# Patient Record
Sex: Male | Born: 1955
Health system: Southern US, Community
[De-identification: ages and names within clinical notes are randomized; demographics above are authoritative.]

## PROBLEM LIST (undated history)

## (undated) DIAGNOSIS — G4730 Sleep apnea, unspecified: Secondary | ICD-10-CM

## (undated) DIAGNOSIS — G4700 Insomnia, unspecified: Secondary | ICD-10-CM

## (undated) DIAGNOSIS — R7303 Prediabetes: Secondary | ICD-10-CM

## (undated) DIAGNOSIS — K219 Gastro-esophageal reflux disease without esophagitis: Secondary | ICD-10-CM

## (undated) DIAGNOSIS — G9340 Encephalopathy, unspecified: Secondary | ICD-10-CM

## (undated) DIAGNOSIS — Z8679 Personal history of other diseases of the circulatory system: Secondary | ICD-10-CM

## (undated) DIAGNOSIS — K429 Umbilical hernia without obstruction or gangrene: Secondary | ICD-10-CM

## (undated) DIAGNOSIS — E782 Mixed hyperlipidemia: Secondary | ICD-10-CM

## (undated) DIAGNOSIS — R011 Cardiac murmur, unspecified: Secondary | ICD-10-CM

## (undated) DIAGNOSIS — L821 Other seborrheic keratosis: Secondary | ICD-10-CM

## (undated) DIAGNOSIS — J45909 Unspecified asthma, uncomplicated: Secondary | ICD-10-CM

## (undated) DIAGNOSIS — Z8619 Personal history of other infectious and parasitic diseases: Secondary | ICD-10-CM

## (undated) DIAGNOSIS — K6289 Other specified diseases of anus and rectum: Secondary | ICD-10-CM

## (undated) DIAGNOSIS — D649 Anemia, unspecified: Secondary | ICD-10-CM

## (undated) DIAGNOSIS — IMO0001 Reserved for inherently not codable concepts without codable children: Secondary | ICD-10-CM

## (undated) DIAGNOSIS — K760 Fatty (change of) liver, not elsewhere classified: Secondary | ICD-10-CM

## (undated) DIAGNOSIS — R7881 Bacteremia: Secondary | ICD-10-CM

## (undated) DIAGNOSIS — J342 Deviated nasal septum: Secondary | ICD-10-CM

## (undated) DIAGNOSIS — Z952 Presence of prosthetic heart valve: Secondary | ICD-10-CM

## (undated) DIAGNOSIS — E669 Obesity, unspecified: Secondary | ICD-10-CM

## (undated) DIAGNOSIS — M4646 Discitis, unspecified, lumbar region: Secondary | ICD-10-CM

## (undated) DIAGNOSIS — I10 Essential (primary) hypertension: Secondary | ICD-10-CM

## (undated) DIAGNOSIS — I8390 Asymptomatic varicose veins of unspecified lower extremity: Secondary | ICD-10-CM

## (undated) DIAGNOSIS — R040 Epistaxis: Secondary | ICD-10-CM

## (undated) HISTORY — PX: TONSILLECTOMY: SUR1361

## (undated) HISTORY — DX: Other specified diseases of anus and rectum: K62.89

## (undated) HISTORY — PX: OTHER SURGICAL HISTORY: SHX169

## (undated) HISTORY — DX: Insomnia, unspecified: G47.00

## (undated) HISTORY — DX: Asymptomatic varicose veins of unspecified lower extremity: I83.90

## (undated) HISTORY — DX: Sleep apnea, unspecified: G47.30

---

## 1981-07-04 HISTORY — PX: AORTIC VALVE REPLACEMENT: SHX41

## 2012-10-17 DIAGNOSIS — G4733 Obstructive sleep apnea (adult) (pediatric): Secondary | ICD-10-CM | POA: Insufficient documentation

## 2014-03-02 DIAGNOSIS — F5101 Primary insomnia: Secondary | ICD-10-CM | POA: Insufficient documentation

## 2014-03-02 DIAGNOSIS — Z952 Presence of prosthetic heart valve: Secondary | ICD-10-CM | POA: Insufficient documentation

## 2014-03-17 ENCOUNTER — Other Ambulatory Visit: Payer: Self-pay | Admitting: *Deleted

## 2014-03-17 DIAGNOSIS — I83893 Varicose veins of bilateral lower extremities with other complications: Secondary | ICD-10-CM

## 2014-04-15 ENCOUNTER — Institutional Professional Consult (permissible substitution): Payer: Self-pay | Admitting: Pulmonary Disease

## 2014-04-18 ENCOUNTER — Encounter: Payer: Self-pay | Admitting: *Deleted

## 2014-04-21 ENCOUNTER — Encounter: Payer: Self-pay | Admitting: Vascular Surgery

## 2014-04-22 ENCOUNTER — Ambulatory Visit (HOSPITAL_COMMUNITY)
Admission: RE | Admit: 2014-04-22 | Discharge: 2014-04-22 | Disposition: A | Discharge status: Home / Self Care | Payer: 59 | Source: Ambulatory Visit | Attending: Vascular Surgery | Admitting: Vascular Surgery

## 2014-04-22 ENCOUNTER — Encounter: Payer: Self-pay | Admitting: Vascular Surgery

## 2014-04-22 ENCOUNTER — Ambulatory Visit: Payer: Self-pay | Admitting: Interventional Cardiology

## 2014-04-22 ENCOUNTER — Ambulatory Visit (INDEPENDENT_AMBULATORY_CARE_PROVIDER_SITE_OTHER): Payer: 59 | Admitting: Vascular Surgery

## 2014-04-22 VITALS — BP 150/91 | HR 65 | Resp 16 | Ht 72.0 in | Wt 250.0 lb

## 2014-04-22 DIAGNOSIS — I83892 Varicose veins of left lower extremities with other complications: Secondary | ICD-10-CM

## 2014-04-22 DIAGNOSIS — I83812 Varicose veins of left lower extremities with pain: Secondary | ICD-10-CM

## 2014-04-22 DIAGNOSIS — I8393 Asymptomatic varicose veins of bilateral lower extremities: Secondary | ICD-10-CM | POA: Diagnosis not present

## 2014-04-22 DIAGNOSIS — I83899 Varicose veins of unspecified lower extremities with other complications: Secondary | ICD-10-CM | POA: Insufficient documentation

## 2014-04-22 NOTE — Progress Notes (Signed)
Subjective:     Patient ID: Jeffery Whitehead, male   DOB: [birth year], 58 y.o.   MRN: [id number]  HPI this 58 year-old male is evaluated for recurrent varicose veins in the left leg. Patient had laser ablation treatment in [place] and 20/10 in 2011 in both his right and left lower extremities. He fairly rapidly developed recurrent varicosities in the left leg in the medial calf medial distal thigh and a large cluster in the proximal thigh. He has had increasing edema in the left ankle area. He said no history of DVT, thrombophlebitis, stasis ulcers, or bleeding. He does not wear last compression stockings. He also developed a small recurrent varicosity in the medial thigh on the right side. He is having some aching and throbbing discomfort as the day progresses.   Past Medical History  Diagnosis Date  . Varicose vein of leg   . Sleep apnea   . Decreased anal sphincter tone   . Insomnia     History  Substance Use Topics  . Smoking status: Never Smoker   . Smokeless tobacco: Not on file  . Alcohol Use: Not on file    History reviewed. No pertinent family history.  No Known Allergies  Current outpatient prescriptions:Warfarin Sodium (COUMADIN PO), Take by mouth as directed. 9 mg on S/S/T/TH... 7 mg all other days., Disp: , Rfl: ;  Zolpidem Tartrate (AMBIEN PO), Take by mouth as directed. No dosage given, Disp: , Rfl:   BP 150/91  Pulse 65  Resp 16  Ht 6' (1.829 m)  Wt 250 lb (113.399 kg)  BMI 33.90 kg/m2  Body mass index is 33.9 kg/(m^2).           Review of Systems denies chest pain, dyspnea on exertion, PND, orthopnea, hemoptysis. Patient had St. Jude valve placed in [place] in [place] by Dr. Leona Carry. He has been on Coumadin therapy since that time. Has had no complications from the valve replacement. All other systems negative the complete review of systems    Objective:   Physical Exam BP 150/91  Pulse 65  Resp 16  Ht 6' (1.829 m)  Wt 250 lb (113.399 kg)  BMI  33.90 kg/m2  Gen.-alert and oriented x3 in no apparent distress HEENT normal for age Lungs no rhonchi or wheezing Cardiovascular regular rhythm -Crisp  valve sounds from prosthetic valve replacement-- carotid pulses 3+ palpable no bruits audible Abdomen soft nontender no palpable masses Musculoskeletal free of  major deformities Skin clear -no rashes Neurologic normal Lower extremities 3+ femoral and dorsalis pedis pulses palpable bilaterally with 1+ edema bilaterally Left leg with large nests of extensive bulging varicosities getting at the saphenofemoral junction and extending medially in the thigh distal thigh and medial calf. Cluster of reticular veins and left ankle area with 1+ edema but no active ulceration. Right leg has isolated varix in medial mid thigh area with 1+ edema.  Today I ordered a venous duplex exam of the left leg which are reviewed and interpreted. There is no DVT. There is gross reflux and a short segment of greater saphenous vein beginning at the saphenofemoral junction and extending about 4-5 cm distally. There is no reflux and the small saphenous vein.        Assessment:     Recurrent large painful varicosities due to  reflux left great saphenous vein    Plan:     Patient needs ligation of greater saphenous vein at saphenofemoral junction with excision multiple varicosities as an open operative  procedure Patient has appointment to establish cardiology followup with Dr. Casandra Doffing in the next few weeks Will need to get an opinion regarding discontinuing Coumadin therapy or bridging him with Lovenox around the surgical procedure Apparently in the past when patient has had to come off of his Coumadin he has not been bridged with any anticoagulation      #1 long leg elastic compression stockings 20-30 mm gradient #2 elevate legs as much as possible #3 ibuprofen daily on a regular basis for pain #4 return in 3 months-if no significant improvement then  patient will need ligation left saphenofemoral junction and excision multiple varicosities under general anesthesia

## 2014-05-05 ENCOUNTER — Telehealth: Payer: Self-pay | Admitting: Pulmonary Disease

## 2014-05-05 NOTE — Telephone Encounter (Signed)
Results were received from [facility]. No sleep study attached. Pt will have to sign a medical release at [facility] to get records from [facility]  Called pt and LMTCB

## 2014-05-05 NOTE — Telephone Encounter (Signed)
Message copied from [facility]:  I am wondering if you have my medical history for my Pulmonology as I am scheduled for an appointment on November 18.  My previous Pulmonologist (Dr. Cassell Smiles) has moved to a new practice, but the practice he saw me at was [facility].  The address is [address], [address], [place], [place].  The phone number is [phone].  You may have received this data from [facility] in [place], [place], but in case you have not, I did want to give you the data on the last Pulmonologist I saw.    Jeffery Whitehead

## 2014-05-06 NOTE — Telephone Encounter (Signed)
Spoke with pt and advised that records were received but will still need sleep study if he had one.

## 2014-05-21 ENCOUNTER — Ambulatory Visit (INDEPENDENT_AMBULATORY_CARE_PROVIDER_SITE_OTHER): Payer: 59 | Admitting: Pulmonary Disease

## 2014-05-21 ENCOUNTER — Encounter: Payer: Self-pay | Admitting: Pulmonary Disease

## 2014-05-21 VITALS — BP 138/82 | HR 70 | Temp 98.1°F | Ht 72.0 in | Wt 253.4 lb

## 2014-05-21 DIAGNOSIS — G4733 Obstructive sleep apnea (adult) (pediatric): Secondary | ICD-10-CM

## 2014-05-21 NOTE — Progress Notes (Signed)
Subjective:    Patient ID: Jeffery Whitehead, male    DOB: 11/15/1955, 58 y.o.   MRN: [id number]  HPI The patient is a 58 year old male who I have been asked to see for management of obstructive sleep apnea. The patient was diagnosed with moderate obstructive sleep apnea and 2013 while living in [place], and he had an AHI of 21 events per hour. He was started on C Pap with a titrated pressure to 9 cm, and has been wearing the device compliantly since that time. He currently has dated supplies, including his hoses, humidity chamber, and mask with headgear. The patient has a long-standing history of chronic sleep onset insomnia, for which she usually takes Ambien. The patient states that he sleeps well once he gets to sleep, and feels rested when he first arises in the morning. However, he does not feel energetic during the day, and makes the point that he does worse when wearing the device then not. However, he does not describe daytime sleepiness, and his Epworth score today is only 4. We have gotten a download today that shows excellent control of his AHI, good compliance, but he does have excessive mask leak. The patient states that his weight is up 35 pounds over the last 2 years.   Sleep Questionnaire What time do you typically go to bed?( Between what hours) 10:30-11:30 PM 10:30-11:30 PM at 1603 on 05/21/14 by Inge Rise, CMA How long does it take you to fall asleep? 10 min w/ taking ambien.  10 min w/ taking ambien.  at 1603 on 05/21/14 by Inge Rise, CMA How many times during the night do you wake up? 0 0 at 1603 on 05/21/14 by Inge Rise, CMA What time do you get out of bed to start your day? 0430 0430 at 1603 on 05/21/14 by Inge Rise, CMA Do you drive or operate heavy machinery in your occupation? No No at 1603 on 05/21/14 by Inge Rise, CMA How much has your weight changed (up or down) over the past two years? (In pounds) 35 lb (15.876 kg) 35 lb (15.876 kg) at 1603 on  05/21/14 by Inge Rise, CMA Have you ever had a sleep study before? Yes Yes at 1603 on 05/21/14 by Inge Rise, CMA If yes, location of study? [facility] on 05/21/14 by Inge Rise, CMA If yes, date of study? 2014 2014 at 1603 on 05/21/14 by Inge Rise, CMA Do you currently use CPAP? Yes Yes at 1603 on 05/21/14 by Inge Rise, CMA If so, what pressure? not sure not sure at 1603 on 05/21/14 by Inge Rise, CMA Do you wear oxygen at any time? No No at 1603 on 05/21/14 by Inge Rise, CMA   Review of Systems  Constitutional: Negative for fever and unexpected weight change.  HENT: Negative for congestion, dental problem, ear pain, nosebleeds, postnasal drip, rhinorrhea, sinus pressure, sneezing, sore throat and trouble swallowing.   Eyes: Negative for redness and itching.  Respiratory: Negative for cough, chest tightness, shortness of breath and wheezing.   Cardiovascular: Negative for palpitations and leg swelling.  Gastrointestinal: Negative for nausea and vomiting.  Genitourinary: Negative for dysuria.  Musculoskeletal: Negative for joint swelling.  Skin: Negative for rash.  Neurological: Negative for headaches.  Hematological: Does not bruise/bleed easily.  Psychiatric/Behavioral: Negative for dysphoric mood. The patient is not nervous/anxious.        Objective:  Physical Exam Constitutional:  Overweight male, no acute distress  HENT:  Nares patent without discharge  Oropharynx without exudate, palate and uvula are mildly elongated.   Eyes:  Perrla, eomi, no scleral icterus  Neck:  No JVD, no TMG  Cardiovascular:  Normal rate, regular rhythm, no rubs or gallops.  No murmurs        Intact distal pulses  Pulmonary :  Normal breath sounds, no stridor or respiratory distress   No rales, rhonchi, or wheezing  Abdominal:  Soft, nondistended, bowel sounds present.  No tenderness noted.   Musculoskeletal:  No  lower extremity edema noted.  Lymph Nodes:  No cervical lymphadenopathy noted  Skin:  No cyanosis noted  Neurologic:  Alert, appropriate, moves all 4 extremities without obvious deficit.          Assessment & Plan:

## 2014-05-21 NOTE — Patient Instructions (Signed)
Will have [employer] get you a new mask and humidifier chamber. Will try you on the auto setting if this is available on your device. Work on [employer] loss Will send a note to your primary physician recommending referral to a behavioral psychologist for CBT to treat your insomnia followup with me again on a yearly basis, but let me know if your are having issues with your cpap.

## 2014-05-21 NOTE — Assessment & Plan Note (Signed)
The patient has a history of moderate obstructive sleep apnea, and has been on C Pap since 2013. His download shows excellent compliance, good control of his AHI, but he does have excessive mask leak. He is overdue for a new humidity chamber, mask, as well as tubing. Despite wearing his device and having good control of his sleep apnea, he feels more tired during the day when he wears his device than not. I suspect this is related to other issues, and he does have a chronic issue with insomnia. I have explained to him the treatment of insomnia is primarily behavioral therapy with a psychologist. I wonder if he would do better on an automatic setting rather than a fixed pressure, and we'll send an order to his home care company to see if this is available on his device. Finally, I have encouraged him to work aggressively on weight loss since he has gained significant weight in the last 2 years.

## 2014-06-19 ENCOUNTER — Encounter: Payer: Self-pay | Admitting: Interventional Cardiology

## 2014-06-19 ENCOUNTER — Ambulatory Visit (INDEPENDENT_AMBULATORY_CARE_PROVIDER_SITE_OTHER): Payer: 59 | Admitting: Interventional Cardiology

## 2014-06-19 VITALS — BP 138/84 | HR 61 | Ht 72.0 in | Wt 249.2 lb

## 2014-06-19 DIAGNOSIS — Z954 Presence of other heart-valve replacement: Secondary | ICD-10-CM

## 2014-06-19 DIAGNOSIS — E663 Overweight: Secondary | ICD-10-CM | POA: Insufficient documentation

## 2014-06-19 DIAGNOSIS — R0609 Other forms of dyspnea: Secondary | ICD-10-CM

## 2014-06-19 DIAGNOSIS — Z952 Presence of prosthetic heart valve: Secondary | ICD-10-CM

## 2014-06-19 NOTE — Progress Notes (Signed)
Patient ID: Jeffery Whitehead, male   DOB: 1956-07-04, 58 y.o.   MRN: [id number]     Patient ID: Jeffery Whitehead MRN: [id number] DOB/AGE: 05-27-1956 58 y.o.   Referring Physician Dr. Modena Morrow   Reason for Consultation  H/o AVR in 1983  HPI: 58 y/o who had an AVR in 65 at age 42, with a St Jude Valve; model number 25A-101; serial number [id number]; blood type A+.  He has been on Coumadin since that time.  He moved here from [place] several months ago for his job at [employer]. He had a bicuspid aortic valve that was replaced.  Only bleeding issue has been occasional bloody nose.  He has had several cauterizations.    Over the past few months, he noticed some DOE.  He saw a lung   He has OSA and uses CPAP.  He has started to have some knee pain as well.  THis limits his walking.  He has gained weight in the psat few months as well.  He walks his dogs as well.  No chest pain. No nausea, vomiting or sweating when not expected.  He sweats more with less exertion in the past few months.    No early CAD in the family.  Stress test on the treadmill was done 2 years ago.  He had his last echo a year and a half ago.  He was having annual echos.  He requested records form [place].      Current Outpatient Prescriptions  Medication Sig Dispense Refill  . fluticasone (FLONASE) 50 MCG/ACT nasal spray Place 1 spray into both nostrils as needed for allergies or rhinitis (as needed for allergy symptoms).    . loratadine (CLARITIN) 10 MG tablet Take 10 mg by mouth daily.    . Multiple Vitamins-Minerals (CENTRUM SILVER ADULT 50+ PO) Take 1 tablet by mouth daily with breakfast.    . SF 5000 PLUS 1.1 % CREA dental cream Place 1 application onto teeth at bedtime.     . Warfarin Sodium (COUMADIN PO) Take by mouth as directed. Take as directed    . zolpidem (AMBIEN) 10 MG tablet One tablet at bedtime     No current facility-administered medications for this visit.   Past Medical History  Diagnosis Date  .  Varicose vein of leg   . Sleep apnea   . Decreased anal sphincter tone   . Insomnia     Family History  Problem Relation Age of Onset  . Heart disease Father     History   Social History  . Marital Status: Unknown    Spouse Name: N/A    Number of Children: 1  . Years of Education: N/A   Occupational History  . Database administrator    Social History Main Topics  . Smoking status: Never Smoker   . Smokeless tobacco: Not on file  . Alcohol Use: 0.0 oz/week    0 Not specified per week     Comment: wine at night  . Drug Use: No  . Sexual Activity: Not on file   Other Topics Concern  . Not on file   Social History Narrative    Past Surgical History  Procedure Laterality Date  . Aortic valve replacement  1983      (Not in a hospital admission)  Review of systems complete and found to be negative unless listed above .  No nausea, vomiting.  No fever chills, No focal weakness,  No palpitations.  Physical Exam: Danley Danker  Vitals:   06/19/14 0758  BP: 138/84  Pulse: 61    Weight: 249 lb 3.2 oz (113.036 kg)  Physical exam:  [place]/AT EOMI No JVD, No carotid bruit RRR S1 crisp S2 click No wheezing Soft. NT, nondistended No edema.  Large varicose veins at left groin and thigh No focal motor or sensory deficits Normal affect No rash  Labs:  No results found for: WBC, HGB, HCT, MCV, PLT No results for input(s): NA, K, CL, CO2, BUN, CREATININE, CALCIUM, PROT, BILITOT, ALKPHOS, ALT, AST, GLUCOSE in the last 168 hours.  Invalid input(s): LABALBU No results found for: CKTOTAL, CKMB, CKMBINDEX, TROPONINI No results found for: CHOL No results found for: HDL No results found for: LDLCALC No results found for: TRIG No results found for: CHOLHDL No results found for: LDLDIRECT     EKG: NSR, TWI inferiorly and laterally  ASSESSMENT AND PLAN:  1) Aortic valve replacement:  Plan for echocardiogram. He had been having annual echocardiograms while in [place].  My exam, I  think his valve is functioning well. However he now has this new onset shortness of breath. It may be related to weight gain and deconditioning.  2) Coumadin checks: He is interested in home INR monitoring.  3)DOE: Check echo.  If valve function is normal by echocardiogram, would plan for stress test.  He would likely need stress echo or nuclear stress testing given his abnormal ECG. Given his diagnosis of sleep apnea, continue treatment with CPAP.  4) obesity: He has gained weight during the time of his move and since that time. We talked about trying to improve his diet. We also talked about eventually increasing exercise to at least 150 minutes per week after cardiac workup. We'll try to find some activity that it does not cause too much discomfort in his knee.  Signed:   Mina Marble, MD, [facility] 06/19/2014, 8:11 AM

## 2014-06-19 NOTE — Patient Instructions (Signed)
Your physician recommends that you continue on your current medications as directed. Please refer to the Current Medication list given to you today.   Your physician has requested that you have an echocardiogram. Echocardiography is a painless test that uses sound waves to create images of your heart. It provides your doctor with information about the size and shape of your heart and how well your heart's chambers and valves are working. This procedure takes approximately one hour. There are no restrictions for this procedure.  Your physician wants you to follow-up in: 6 months with Dr. Irish Lack. You will receive a reminder letter in the mail two months in advance. If you don't receive a letter, please call our office to schedule the follow-up appointment.

## 2014-06-20 ENCOUNTER — Other Ambulatory Visit (HOSPITAL_COMMUNITY): Payer: 59

## 2014-06-30 ENCOUNTER — Ambulatory Visit (HOSPITAL_COMMUNITY): Payer: 59 | Attending: Cardiology | Admitting: Cardiology

## 2014-06-30 DIAGNOSIS — E669 Obesity, unspecified: Secondary | ICD-10-CM | POA: Insufficient documentation

## 2014-06-30 DIAGNOSIS — Z954 Presence of other heart-valve replacement: Secondary | ICD-10-CM | POA: Diagnosis not present

## 2014-06-30 DIAGNOSIS — Z952 Presence of prosthetic heart valve: Secondary | ICD-10-CM

## 2014-06-30 NOTE — Progress Notes (Signed)
Echo performed. 

## 2014-07-28 ENCOUNTER — Encounter: Payer: Self-pay | Admitting: Vascular Surgery

## 2014-07-29 ENCOUNTER — Encounter: Payer: Self-pay | Admitting: Vascular Surgery

## 2014-07-29 ENCOUNTER — Ambulatory Visit (INDEPENDENT_AMBULATORY_CARE_PROVIDER_SITE_OTHER): Payer: 59 | Admitting: Vascular Surgery

## 2014-07-29 VITALS — BP 140/92 | HR 71 | Resp 16 | Ht 72.0 in | Wt 250.0 lb

## 2014-07-29 DIAGNOSIS — I83892 Varicose veins of left lower extremities with other complications: Secondary | ICD-10-CM

## 2014-07-29 NOTE — Progress Notes (Signed)
Subjective:     Patient ID: Jeffery Whitehead, male   DOB: 1955/07/30, 59 y.o.   MRN: [id number]  HPI this 59 year old male returns for further follow-up regarding his recurrent painful varicosities in the left leg. He had laser ablation in 2010 and again in 2011 in [place] and was seen 3 months ago with large painful recurrent varicosities in the medial thigh from proximal to distal and in the medial calf of the left leg. He also has chronic edema and pain. He has tried long-leg elastic compression stockings 20-30 millimeter gradient as well as elevation and ibuprofen with no improvement in his symptoms. These are affecting his daily living  Past Medical History  Diagnosis Date  . Varicose vein of leg   . Sleep apnea   . Decreased anal sphincter tone   . Insomnia   . Varicose veins     History  Substance Use Topics  . Smoking status: Never Smoker   . Smokeless tobacco: Never Used  . Alcohol Use: 0.0 oz/week    0 Not specified per week     Comment: wine at night    Family History  Problem Relation Age of Onset  . Heart disease Father     No Known Allergies   Current outpatient prescriptions:  .  fluticasone (FLONASE) 50 MCG/ACT nasal spray, Place 1 spray into both nostrils as needed for allergies or rhinitis (as needed for allergy symptoms)., Disp: , Rfl:  .  loratadine (CLARITIN) 10 MG tablet, Take 10 mg by mouth daily., Disp: , Rfl:  .  Multiple Vitamins-Minerals (CENTRUM SILVER ADULT 50+ PO), Take 1 tablet by mouth daily with breakfast., Disp: , Rfl:  .  SF 5000 PLUS 1.1 % CREA dental cream, Place 1 application onto teeth at bedtime. , Disp: , Rfl:  .  Warfarin Sodium (COUMADIN PO), Take by mouth as directed. Take as directed, Disp: , Rfl:  .  zolpidem (AMBIEN) 10 MG tablet, One tablet at bedtime, Disp: , Rfl:   BP 140/92 mmHg  Pulse 71  Resp 16  Ht 6' (1.829 m)  Wt 250 lb (113.399 kg)  BMI 33.90 kg/m2  Body mass index is 33.9 kg/(m^2).           Review of  Systems denies chest pain, orthopnea, hemoptysis, claudication. Has history of St. Jude heart valve replacement-on chronic Coumadin since 1983     Objective:   Physical Exam BP 140/92 mmHg  Pulse 71  Resp 16  Ht 6' (1.829 m)  Wt 250 lb (113.399 kg)  BMI 33.90 kg/m2  Gen.-alert and oriented x3 in no apparent distress HEENT normal for age Lungs no rhonchi or wheezing Cardiovascular regular rhythm , audible click from prosthetic valve, carotid pulses 3+ palpable no bruits audible Abdomen soft nontender no palpable masses Musculoskeletal free of  major deformities Skin clear -no rashes Neurologic normal Lower extremities 3+ femoral and dorsalis pedis pulses palpable bilaterally with no edema on right 1+ edema on left Large nest of bulging varicosities beginning at the inguinal crease on the left side extending medially and down the medial thigh into the medial calf with 1+ edema and nest of reticular veins distally.  Duplex scan at last visit revealed very short stump of left great saphenous vein supplying these bulging varicosities with gross reflux throughout     Assessment:     Recurrent varicosities left thigh and calf which are painful and affecting patient's daily living due to gross reflux proximal stump of great saphenous vein  status post previous ablation procedures 2 in [place] Prosthetic heart valve-on Coumadin therapy    Plan:     Patient needs ligation of great saphenous vein at saphenofemoral junction with removal of painful varicosities in left thigh and calf-greater than 20 stab phlebectomy Will proceed with precertification and organize the discontinuing of Coumadin preoperatively 5 days prior to surgery and Lovenox bridge if Dr. Irish Lack feels this is indicated.

## 2014-07-31 ENCOUNTER — Other Ambulatory Visit: Payer: Self-pay

## 2014-08-06 NOTE — Pre-Procedure Instructions (Signed)
Cadence Haslam  08/06/2014   Your procedure is scheduled on:  08/15/14  Report to [facility] Admitting at 530 AM.  Call this number if you have problems the morning of surgery: [phone]   Remember:   Do not eat food or drink liquids after midnight.   Take these medicines the morning of surgery with A SIP OF WATER: all inhalers   Do not wear jewelry, make-up or nail polish.  Do not wear lotions, powders, or perfumes. You may wear deodorant.  Do not shave 48 hours prior to surgery. Men may shave face and neck.  Do not bring valuables to the hospital.  [facility] is not responsible                  for any belongings or valuables.               Contacts, dentures or bridgework may not be worn into surgery.  Leave suitcase in the car. After surgery it may be brought to your room.  For patients admitted to the hospital, discharge time is determined by your                treatment team.               Patients discharged the day of surgery will not be allowed to drive  home.  Name and phone number of your driver: family  Special Instructions: Shower using CHG 2 nights before surgery and the night before surgery.  If you shower the day of surgery use CHG.  Use special wash - you have one bottle of CHG for all showers.  You should use approximately 1/3 of the bottle for each shower.   Please read over the following fact sheets that you were given: Pain Booklet, Coughing and Deep Breathing and Surgical Site Infection Prevention

## 2014-08-07 ENCOUNTER — Encounter (HOSPITAL_COMMUNITY)
Admission: RE | Admit: 2014-08-07 | Discharge: 2014-08-07 | Disposition: A | Discharge status: Home / Self Care | Payer: 59 | Source: Ambulatory Visit | Attending: Vascular Surgery | Admitting: Vascular Surgery

## 2014-08-07 ENCOUNTER — Encounter (HOSPITAL_COMMUNITY): Payer: Self-pay

## 2014-08-07 ENCOUNTER — Telehealth: Payer: Self-pay | Admitting: Interventional Cardiology

## 2014-08-07 DIAGNOSIS — G4733 Obstructive sleep apnea (adult) (pediatric): Secondary | ICD-10-CM | POA: Insufficient documentation

## 2014-08-07 DIAGNOSIS — I8392 Asymptomatic varicose veins of left lower extremity: Secondary | ICD-10-CM | POA: Insufficient documentation

## 2014-08-07 DIAGNOSIS — Z7901 Long term (current) use of anticoagulants: Secondary | ICD-10-CM | POA: Diagnosis not present

## 2014-08-07 DIAGNOSIS — Z01812 Encounter for preprocedural laboratory examination: Secondary | ICD-10-CM | POA: Diagnosis present

## 2014-08-07 HISTORY — DX: Presence of prosthetic heart valve: Z95.2

## 2014-08-07 HISTORY — DX: Reserved for inherently not codable concepts without codable children: IMO0001

## 2014-08-07 LAB — APTT: aPTT: 38 seconds — ABNORMAL HIGH (ref 24–37)

## 2014-08-07 LAB — PROTIME-INR
INR: 1.84 — ABNORMAL HIGH (ref 0.00–1.49)
Prothrombin Time: 21.4 seconds — ABNORMAL HIGH (ref 11.6–15.2)

## 2014-08-07 LAB — POCT I-STAT 4, (NA,K, GLUC, HGB,HCT)
GLUCOSE: 102 mg/dL — AB (ref 70–99)
HCT: 41 % (ref 39.0–52.0)
HEMOGLOBIN: 13.9 g/dL (ref 13.0–17.0)
Potassium: 4.4 mmol/L (ref 3.5–5.1)
SODIUM: 140 mmol/L (ref 135–145)

## 2014-08-07 MED ORDER — CHLORHEXIDINE GLUCONATE CLOTH 2 % EX PADS: 6.0000 | MEDICATED_PAD | Freq: Once | CUTANEOUS | Status: DC

## 2014-08-07 NOTE — Progress Notes (Signed)
Pt is to contact Dr Irish Lack req. The lovenox bridge.

## 2014-08-07 NOTE — Progress Notes (Addendum)
Anesthesia Chart Review:  Pt is 59 year old male scheduled for L saphenous vein ligation and excision of varicosities on 08/15/2014 with Dr. Kellie Simmering.  Cardiologist is Dr. Irish Lack.   PMH includes: AVR in 1983, OSA. BMI 34  Medications include: coumadin. Per Dr. Kellie Simmering, pt is to stop coumadin 5 days prior to surgery. Pt has call in to Dr. Irish Lack to see if needs lovenox bridge.   Preoperative labs reviewed.  PTT 38. PT/INR 21.4/1.84. Will repeat DOS.   EKG 06/19/2014: NSR. ST & T wave abnormality, consider inferior ischemia. ST & T wave abnormality, consider anterolateral ischemia.   Echo 06/30/2014: - Left ventricle: The cavity size was moderately dilated. There was moderate focal basal and mild concentric hypertrophy. Systolic function was normal. The estimated ejection fraction was in the range of 55% to 60%. Wall motion was normal; there were no regional wall motion abnormalities. - Aortic valve: There is a mechanical prosthesis in the aortic position that is not well visualized. The mean AV gradient is 21mmHg. A mechanical prosthesis was present. - Aorta: Aortic root dimension: 41 mm (ED). - Ascending aorta: The ascending aorta was mildly dilated. - Mitral valve: Calcified annulus. There was mild regurgitation. - Right ventricle: The cavity size was mildly dilated. Wall thickness was normal.  Willeen Cass, FNP-BC [facility]/Anesthesiology Phone: [phone] 08/07/2014 4:39 PM  Addendum:  Colletta [place] from [facility] reached out to Dr. Irish Lack, questioning if pt requires stress test prior to surgery. Per telephone note in Epic from Dr. Irish Lack dated 08/07/2014, pt does not need stress test.   If no changes, I anticipate pt can proceed with surgery as scheduled.   Willeen Cass, FNP-BC [facility] Short Stay Surgical Center/Anesthesiology Phone: [phone] 08/08/2014 12:51 PM

## 2014-08-07 NOTE — Telephone Encounter (Signed)
F/U        Colletta [place] from [facility] calling about Levonox bridge and ordering a stress test for pt.   Please return call at [phone].

## 2014-08-07 NOTE — Telephone Encounter (Signed)
Request for surgical clearance:  1. What type of surgery is being performed? Varicose Vein   2. When is this surgery scheduled? 2.12.16   3. Are there any medications that need to be held prior to surgery and how long?Coumadin  "Need to know how how he need to come off"  4. Name of physician performing surgery? Dr Kellie Simmering   5. What is your office phone and fax number? Phone [phone] and fax # pt don't have.  6.

## 2014-08-07 NOTE — Telephone Encounter (Signed)
Forwarded to Gay Filler to review for lovenox and to Dr. Vladimir Faster to address stress test.

## 2014-08-07 NOTE — Telephone Encounter (Signed)
He does not need a stress test.  Awaiting recs from [facility] regarding bridging of anticoagulation.

## 2014-08-07 NOTE — Telephone Encounter (Signed)
Please assess need for bridging.

## 2014-08-08 NOTE — Telephone Encounter (Signed)
Follow up   Pt called to follow up on bridging of coumadin. Please call

## 2014-08-08 NOTE — Telephone Encounter (Signed)
Pt is on Coumadin for AVR.  Has no history of stroke or atrial fibrillation.  Per protocol, okay to hold Coumadin x 5 days with no Lovenox bridge.  Pt aware of recommendation.

## 2014-08-14 MED ORDER — SODIUM CHLORIDE 0.9 % IV SOLN: INTRAVENOUS | Status: DC

## 2014-08-14 MED ORDER — DEXTROSE 5 % IV SOLN
1.5000 g | INTRAVENOUS | Status: AC
  Administered 2014-08-15: 1.5 g via INTRAVENOUS
  Filled 2014-08-14: qty 1.5

## 2014-08-15 ENCOUNTER — Ambulatory Visit (HOSPITAL_COMMUNITY)
Admission: RE | Admit: 2014-08-15 | Discharge: 2014-08-15 | Disposition: A | Discharge status: Home / Self Care | Payer: 59 | Source: Ambulatory Visit | Attending: Vascular Surgery | Admitting: Vascular Surgery

## 2014-08-15 ENCOUNTER — Ambulatory Visit (HOSPITAL_COMMUNITY): Payer: 59 | Admitting: Certified Registered Nurse Anesthetist

## 2014-08-15 ENCOUNTER — Ambulatory Visit (HOSPITAL_COMMUNITY): Payer: 59 | Admitting: Emergency Medicine

## 2014-08-15 ENCOUNTER — Encounter (HOSPITAL_COMMUNITY): Payer: Self-pay | Admitting: Certified Registered Nurse Anesthetist

## 2014-08-15 ENCOUNTER — Encounter (HOSPITAL_COMMUNITY)
Admission: RE | Disposition: A | Discharge status: Home / Self Care | Payer: Self-pay | Source: Ambulatory Visit | Attending: Vascular Surgery

## 2014-08-15 ENCOUNTER — Telehealth: Payer: Self-pay | Admitting: Vascular Surgery

## 2014-08-15 DIAGNOSIS — I83812 Varicose veins of left lower extremities with pain: Secondary | ICD-10-CM | POA: Diagnosis present

## 2014-08-15 DIAGNOSIS — G4700 Insomnia, unspecified: Secondary | ICD-10-CM | POA: Diagnosis not present

## 2014-08-15 DIAGNOSIS — I8310 Varicose veins of unspecified lower extremity with inflammation: Secondary | ICD-10-CM

## 2014-08-15 DIAGNOSIS — Z79899 Other long term (current) drug therapy: Secondary | ICD-10-CM | POA: Insufficient documentation

## 2014-08-15 DIAGNOSIS — Z6834 Body mass index (BMI) 34.0-34.9, adult: Secondary | ICD-10-CM | POA: Insufficient documentation

## 2014-08-15 DIAGNOSIS — I739 Peripheral vascular disease, unspecified: Secondary | ICD-10-CM | POA: Insufficient documentation

## 2014-08-15 DIAGNOSIS — G4730 Sleep apnea, unspecified: Secondary | ICD-10-CM | POA: Diagnosis not present

## 2014-08-15 DIAGNOSIS — Z9989 Dependence on other enabling machines and devices: Secondary | ICD-10-CM | POA: Insufficient documentation

## 2014-08-15 HISTORY — DX: Reserved for inherently not codable concepts without codable children: IMO0001

## 2014-08-15 HISTORY — PX: VEIN LIGATION AND STRIPPING: SHX2653

## 2014-08-15 LAB — PROTIME-INR
INR: 1.12 (ref 0.00–1.49)
Prothrombin Time: 14.5 seconds (ref 11.6–15.2)

## 2014-08-15 LAB — APTT: APTT: 30 s (ref 24–37)

## 2014-08-15 SURGERY — LIGATION AND STRIPPING, VARICOSE VEIN
Anesthesia: General | Site: Leg Lower | Laterality: Left

## 2014-08-15 MED ORDER — SUCCINYLCHOLINE CHLORIDE 20 MG/ML IJ SOLN
INTRAMUSCULAR | Status: AC
  Filled 2014-08-15: qty 1

## 2014-08-15 MED ORDER — LACTATED RINGERS IV SOLN
INTRAVENOUS | Status: DC | PRN
  Administered 2014-08-15: 07:00:00 via INTRAVENOUS

## 2014-08-15 MED ORDER — FENTANYL CITRATE 0.05 MG/ML IJ SOLN
INTRAMUSCULAR | Status: DC | PRN
  Administered 2014-08-15 (×4): 50 ug via INTRAVENOUS

## 2014-08-15 MED ORDER — HYDROMORPHONE HCL 1 MG/ML IJ SOLN
INTRAMUSCULAR | Status: AC
  Filled 2014-08-15: qty 1

## 2014-08-15 MED ORDER — ONDANSETRON HCL 4 MG/2ML IJ SOLN: 4.0000 mg | Freq: Once | INTRAMUSCULAR | Status: DC | PRN

## 2014-08-15 MED ORDER — SODIUM CHLORIDE 0.9 % IJ SOLN
INTRAMUSCULAR | Status: AC
  Filled 2014-08-15: qty 10

## 2014-08-15 MED ORDER — LABETALOL HCL 5 MG/ML IV SOLN
INTRAVENOUS | Status: AC
  Filled 2014-08-15: qty 4

## 2014-08-15 MED ORDER — ONDANSETRON HCL 4 MG/2ML IJ SOLN
INTRAMUSCULAR | Status: DC | PRN
  Administered 2014-08-15: 4 mg via INTRAVENOUS

## 2014-08-15 MED ORDER — LIDOCAINE HCL (CARDIAC) 20 MG/ML IV SOLN
INTRAVENOUS | Status: DC | PRN
  Administered 2014-08-15: 70 mg via INTRAVENOUS

## 2014-08-15 MED ORDER — PROPOFOL 10 MG/ML IV BOLUS
INTRAVENOUS | Status: AC
  Filled 2014-08-15: qty 20

## 2014-08-15 MED ORDER — PROPOFOL 10 MG/ML IV BOLUS
INTRAVENOUS | Status: DC | PRN
  Administered 2014-08-15: 30 mg via INTRAVENOUS
  Administered 2014-08-15: 200 mg via INTRAVENOUS

## 2014-08-15 MED ORDER — FENTANYL CITRATE 0.05 MG/ML IJ SOLN
INTRAMUSCULAR | Status: AC
  Filled 2014-08-15: qty 5

## 2014-08-15 MED ORDER — ROCURONIUM BROMIDE 50 MG/5ML IV SOLN
INTRAVENOUS | Status: AC
  Filled 2014-08-15: qty 1

## 2014-08-15 MED ORDER — HYDROCODONE-ACETAMINOPHEN 5-325 MG PO TABS: 1.0000 | ORAL_TABLET | Freq: Four times a day (QID) | ORAL | Status: DC | PRN

## 2014-08-15 MED ORDER — HYDROMORPHONE HCL 1 MG/ML IJ SOLN
0.2500 mg | INTRAMUSCULAR | Status: DC | PRN
  Administered 2014-08-15 (×2): 0.5 mg via INTRAVENOUS

## 2014-08-15 MED ORDER — LABETALOL HCL 5 MG/ML IV SOLN
2.5000 mg | Freq: Once | INTRAVENOUS | Status: AC
  Administered 2014-08-15: 2.5 mg via INTRAVENOUS

## 2014-08-15 MED ORDER — ONDANSETRON HCL 4 MG/2ML IJ SOLN
INTRAMUSCULAR | Status: AC
  Filled 2014-08-15: qty 2

## 2014-08-15 MED ORDER — 0.9 % SODIUM CHLORIDE (POUR BTL) OPTIME
TOPICAL | Status: DC | PRN
  Administered 2014-08-15: 1000 mL

## 2014-08-15 MED ORDER — EPHEDRINE SULFATE 50 MG/ML IJ SOLN
INTRAMUSCULAR | Status: AC
  Filled 2014-08-15: qty 1

## 2014-08-15 MED ORDER — LIDOCAINE HCL (CARDIAC) 20 MG/ML IV SOLN
INTRAVENOUS | Status: AC
  Filled 2014-08-15: qty 5

## 2014-08-15 SURGICAL SUPPLY — 51 items
BAG ISOLATION DRAPE 18X18 (DRAPES) ×1 IMPLANT
BANDAGE ELASTIC 4 VELCRO ST LF (GAUZE/BANDAGES/DRESSINGS) ×2 IMPLANT
BANDAGE ELASTIC 6 VELCRO ST LF (GAUZE/BANDAGES/DRESSINGS) ×2 IMPLANT
BENZOIN TINCTURE PRP APPL 2/3 (GAUZE/BANDAGES/DRESSINGS) ×2 IMPLANT
BLADE SURG 11 STRL SS (BLADE) ×2 IMPLANT
BLADE SURG 15 STRL LF DISP TIS (BLADE) IMPLANT
BLADE SURG 15 STRL SS (BLADE)
CANISTER SUCTION 2500CC (MISCELLANEOUS) ×2 IMPLANT
CLIP TI MEDIUM 6 (CLIP) ×2 IMPLANT
CLIP TI WIDE RED SMALL 24 (CLIP) ×2 IMPLANT
COVER SURGICAL LIGHT HANDLE (MISCELLANEOUS) ×2 IMPLANT
DRAPE ISOLATION BAG 18X18 (DRAPES) ×1
ELECT REM PT RETURN 9FT ADLT (ELECTROSURGICAL) ×2
ELECTRODE REM PT RTRN 9FT ADLT (ELECTROSURGICAL) ×1 IMPLANT
GAUZE SPONGE 4X4 12PLY STRL (GAUZE/BANDAGES/DRESSINGS) ×2 IMPLANT
GAUZE SPONGE 4X4 16PLY XRAY LF (GAUZE/BANDAGES/DRESSINGS) ×2 IMPLANT
GLOVE BIO SURGEON STRL SZ 6.5 (GLOVE) ×2 IMPLANT
GLOVE BIO SURGEON STRL SZ8 (GLOVE) ×2 IMPLANT
GLOVE BIOGEL PI IND STRL 6.5 (GLOVE) ×1 IMPLANT
GLOVE BIOGEL PI INDICATOR 6.5 (GLOVE) ×1
GLOVE SS BIOGEL STRL SZ 7 (GLOVE) ×1 IMPLANT
GLOVE SUPERSENSE BIOGEL SZ 7 (GLOVE) ×1
GOWN STRL REUS W/ TWL LRG LVL3 (GOWN DISPOSABLE) ×3 IMPLANT
GOWN STRL REUS W/TWL LRG LVL3 (GOWN DISPOSABLE) ×3
KIT BASIN OR (CUSTOM PROCEDURE TRAY) ×2 IMPLANT
KIT ROOM TURNOVER OR (KITS) ×2 IMPLANT
MARKER SKIN DUAL TIP RULER LAB (MISCELLANEOUS) ×2 IMPLANT
NS IRRIG 1000ML POUR BTL (IV SOLUTION) ×2 IMPLANT
PACK GENERAL/GYN (CUSTOM PROCEDURE TRAY) ×2 IMPLANT
PACK UNIVERSAL I (CUSTOM PROCEDURE TRAY) ×2 IMPLANT
PAD ARMBOARD 7.5X6 YLW CONV (MISCELLANEOUS) ×2 IMPLANT
PAD CAST 4YDX4 CTTN HI CHSV (CAST SUPPLIES) ×1 IMPLANT
PADDING CAST COTTON 4X4 STRL (CAST SUPPLIES) ×1
PADDING CAST COTTON 6X4 STRL (CAST SUPPLIES) ×2 IMPLANT
PROBE PENCIL 8 MHZ STRL DISP (MISCELLANEOUS) IMPLANT
SPECIMEN JAR SMALL (MISCELLANEOUS) IMPLANT
SPONGE GAUZE 4X4 12PLY STER LF (GAUZE/BANDAGES/DRESSINGS) ×2 IMPLANT
STRIP CLOSURE SKIN 1/2X4 (GAUZE/BANDAGES/DRESSINGS) ×2 IMPLANT
SUT SILK 2 0 (SUTURE) ×1
SUT SILK 2-0 18XBRD TIE 12 (SUTURE) ×1 IMPLANT
SUT SILK 3 0 (SUTURE) ×1
SUT SILK 3-0 18XBRD TIE 12 (SUTURE) ×1 IMPLANT
SUT SILK 4 0 (SUTURE) ×1
SUT SILK 4-0 18XBRD TIE 12 (SUTURE) ×1 IMPLANT
SUT VIC AB 3-0 SH 27 (SUTURE) ×2
SUT VIC AB 3-0 SH 27X BRD (SUTURE) ×2 IMPLANT
SUT VIC AB 3-0 SH 8-18 (SUTURE) ×4 IMPLANT
TOWEL OR 17X26 10 PK STRL BLUE (TOWEL DISPOSABLE) ×2 IMPLANT
UNDERPAD 30X30 INCONTINENT (UNDERPADS AND DIAPERS) ×2 IMPLANT
VEIN STRIPPER DISP (MISCELLANEOUS) IMPLANT
WATER STERILE IRR 1000ML POUR (IV SOLUTION) ×2 IMPLANT

## 2014-08-15 NOTE — Discharge Instructions (Signed)
°What to eat: ° °For your first meals, you should eat lightly; only small meals initially.  If you do not have nausea, you may eat larger meals.  Avoid spicy, greasy and heavy food.   ° °General Anesthesia, Adult, Care After  °Refer to this sheet in the next few weeks. These instructions provide you with information on caring for yourself after your procedure. Your health care provider may also give you more specific instructions. Your treatment has been planned according to current medical practices, but problems sometimes occur. Call your health care provider if you have any problems or questions after your procedure.  °WHAT TO EXPECT AFTER THE PROCEDURE  °After the procedure, it is typical to experience:  °Sleepiness.  °Nausea and vomiting. °HOME CARE INSTRUCTIONS  °For the first 24 hours after general anesthesia:  °Have a responsible person with you.  °Do not drive a car. If you are alone, do not take public transportation.  °Do not drink alcohol.  °Do not take medicine that has not been prescribed by your health care provider.  °Do not sign important papers or make important decisions.  °You may resume a normal diet and activities as directed by your health care provider.  °Change bandages (dressings) as directed.  °If you have questions or problems that seem related to general anesthesia, call the hospital and ask for the anesthetist or anesthesiologist on call. °SEEK MEDICAL CARE IF:  °You have nausea and vomiting that continue the day after anesthesia.  °You develop a rash. °SEEK IMMEDIATE MEDICAL CARE IF:  °You have difficulty breathing.  °You have chest pain.  °You have any allergic problems. °Document Released: 09/26/2000 Document Revised: 02/20/2013 Document Reviewed: 01/03/2013  °ExitCare® Patient Information ©2014 [employer].  ° °Sore Throat  ° ° °A sore throat is a painful, burning, sore, or scratchy feeling of the throat. There may be pain or tenderness when swallowing or talking. You may have  other symptoms with a sore throat. These include coughing, sneezing, fever, or a swollen neck. A sore throat is often the first sign of another sickness. These sicknesses may include a cold, flu, strep throat, or an infection called mono. Most sore throats go away without medical treatment.  °HOME CARE  °Only take medicine as told by your doctor.  °Drink enough fluids to keep your pee (urine) clear or pale yellow.  °Rest as needed.  °Try using throat sprays, lozenges, or suck on hard candy (if older than 4 years or as told).  °Sip warm liquids, such as broth, herbal tea, or warm water with honey. Try sucking on frozen ice pops or drinking cold liquids.  °Rinse the mouth (gargle) with salt water. Mix 1 teaspoon salt with 8 ounces of water.  °Do not smoke. Avoid being around others when they are smoking.  °Put a humidifier in your bedroom at night to moisten the air. You can also turn on a hot shower and sit in the bathroom for 5-10 minutes. Be sure the bathroom door is closed. °GET HELP RIGHT AWAY IF:  °You have trouble breathing.  °You cannot swallow fluids, soft foods, or your spit (saliva).  °You have more puffiness (swelling) in the throat.  °Your sore throat does not get better in 7 days.  °You feel sick to your stomach (nauseous) and throw up (vomit).  °You have a fever or lasting symptoms for more than 2-3 days.  °You have a fever and your symptoms suddenly get worse. °MAKE SURE YOU:  °Understand these   instructions.  °Will watch your condition.  °Will get help right away if you are not doing well or get worse. °Document Released: 03/29/2008 Document Revised: 03/14/2012 Document Reviewed: 02/26/2012  °ExitCare® Patient Information ©2015 [employer]. This information is not intended to replace advice given to you by your health care provider. Make sure you discuss any questions you have with your health care provider.  ° ° ° °

## 2014-08-15 NOTE — Progress Notes (Signed)
Report given to philip rn as caregiver 

## 2014-08-15 NOTE — Op Note (Signed)
OPERATIVE REPORT  Date of Surgery: 08/15/2014  Surgeon: [name], MD  Assistant: Silva Bandy PA  Pre-op Diagnosis: Varicose veins to left leg I83.812  Post-op Diagnosis: Varicose veins to left leg I83.812  Procedure: Procedure  Ane#1 excision #1 ligation saphenous vein at saphenofemoral junction with multiple branch ligation #2 excision multiple varicosities-greater than 20 sthesia: General endotracheal  EBL: Minimal  Complications: None  Procedure Detaithe patient was taken the operating room placed in the upright position at which time the multiple varicosities in the left leg were marked beginning at the saphenofemoral junction extending down medial aspect of the left thigh into the left calf and to the ankle level. Following this patient was returned to supine position satisfactory general and tracheal anesthesia was minister. Left leg was prepped Betadine scrub solution draped in routine sterile manner. Short oblique incision was made inguinal crease saphenous vein exposed at the saphenofemoral junction it was a very large vein under high pressure. There were multiple branches were ligated with 3-0 silk ties and divided. Saphenous vein was ligated at the saphenofemoral junction. Following this  small stab wounds were made with an 11 blade over the marked areas in the secondary varicosities were called avulses using a crochet hook. Compression was applied with patient in Trendelenburg position and adequate hemostasis was achieved. Approximately 30 of the stab wounds were made. Following this the inguinal wound was explored examined and adequate hemostasis achieved it was closed in layers with Vicryl in subcuticular fashion. All stab wounds were then closed with benzoin and Steri-Strips and sterile dressing applied consisting of 4 and 6 inch Webril and  Ace wrap and patient taken to recovery room in stable condition   [name], MD 08/15/2014 8:54 AM

## 2014-08-15 NOTE — Transfer of Care (Signed)
Immediate Anesthesia Transfer of Care Note  Patient: Jeffery Whitehead  Procedure(s) Performed: Procedure(s): SAPHENOUS VEIN LIGATION AND EXCISION OF VARICOSITIES (Left)  Patient Location: PACU  Anesthesia Type:General  Level of Consciousness: awake, alert  and oriented  Airway & Oxygen Therapy: Patient Spontanous Breathing and Patient connected to nasal cannula oxygen  Post-op Assessment: Report given to RN, Post -op Vital signs reviewed and stable and Patient moving all extremities X 4  Post vital signs: Reviewed and stable  Last Vitals:  Filed Vitals:   08/15/14 0859  BP:   Pulse:   Temp: 36.4 C  Resp:     Complications: No apparent anesthesia complications

## 2014-08-15 NOTE — Anesthesia Preprocedure Evaluation (Addendum)
Anesthesia Evaluation  Patient identified by MRN, date of birth, ID band Patient awake    Reviewed: Allergy & Precautions, NPO status , Patient's Chart, lab work & pertinent test results  Airway Mallampati: I  TM Distance: >3 FB Neck ROM: Full    Dental  (+) Dental Advisory Given, Teeth Intact   Pulmonary shortness of breath and with exertion, sleep apnea and Continuous Positive Airway Pressure Ventilation ,          Cardiovascular + Peripheral Vascular Disease + Valvular Problems/Murmurs     Neuro/Psych    GI/Hepatic   Endo/Other  Morbid obesity  Renal/GU      Musculoskeletal   Abdominal   Peds  Hematology   Anesthesia Other Findings   Reproductive/Obstetrics                           Anesthesia Physical Anesthesia Plan  ASA: II  Anesthesia Plan: General   Post-op Pain Management:    Induction: Intravenous  Airway Management Planned: LMA and Oral ETT  Additional Equipment:   Intra-op Plan:   Post-operative Plan: Extubation in OR  Informed Consent: I have reviewed the patients History and Physical, chart, labs and discussed the procedure including the risks, benefits and alternatives for the proposed anesthesia with the patient or authorized representative who has indicated his/her understanding and acceptance.   Dental advisory given  Plan Discussed with: CRNA, Anesthesiologist and Surgeon  Anesthesia Plan Comments:        Anesthesia Quick Evaluation

## 2014-08-15 NOTE — Telephone Encounter (Signed)
-----   Message from Mena Goes, RN sent at 08/15/2014  9:13 AM EST ----- Regarding: Schedule   ----- Message -----    From: Alvia Grove, PA-C    Sent: 08/15/2014   8:57 AM      To: [facility] Charge Pool  S/p left great saphenous vein ligation and stab phlebectomy 08/15/14  F/u with Dr. Kellie Simmering in 6 weeks.   Thanks Maudie Mercury

## 2014-08-15 NOTE — Anesthesia Postprocedure Evaluation (Signed)
  Anesthesia Post-op Note  Patient: Jeffery Whitehead  Procedure(s) Performed: Procedure(s): SAPHENOUS VEIN LIGATION AND EXCISION OF VARICOSITIES (Left)  Patient Location: PACU  Anesthesia Type:General  Level of Consciousness: awake, alert , oriented and patient cooperative  Airway and Oxygen Therapy: Patient Spontanous Breathing  Post-op Pain: mild  Post-op Assessment: Post-op Vital signs reviewed, Patient's Cardiovascular Status Stable, Respiratory Function Stable, Patent Airway, No signs of Nausea or vomiting and Pain level controlled  Post-op Vital Signs: stable  Last Vitals:  Filed Vitals:   08/15/14 1304  BP:   Pulse: 57  Temp:   Resp: 13    Complications: No apparent anesthesia complications

## 2014-08-15 NOTE — Interval H&P Note (Signed)
History and Physical Interval Note:  08/15/2014 7:26 AM  Jeffery Whitehead  has presented today for surgery, with the diagnosis of Varicose veins to left leg I83.812  The various methods of treatment have been discussed with the patient and family. After consideration of risks, benefits and other options for treatment, the patient has consented to  Procedure(s): Colbert (Left) as a surgical intervention .  The patient's history has been reviewed, patient examined, no change in status, stable for surgery.  I have reviewed the patient's chart and labs.  Questions were answered to the patient's satisfaction.     [name]

## 2014-08-15 NOTE — Telephone Encounter (Signed)
Left message for patient re appt, dpm

## 2014-08-15 NOTE — H&P (View-Only) (Signed)
Subjective:     Patient ID: Jeffery Whitehead, male   DOB: [birth year], 59 y.o.   MRN: [id number]  HPI this 59 year old male returns for further follow-up regarding his recurrent painful varicosities in the left leg. He had laser ablation in 2010 and again in 2011 in [place] and was seen 3 months ago with large painful recurrent varicosities in the medial thigh from proximal to distal and in the medial calf of the left leg. He also has chronic edema and pain. He has tried long-leg elastic compression stockings 20-30 millimeter gradient as well as elevation and ibuprofen with no improvement in his symptoms. These are affecting his daily living  Past Medical History  Diagnosis Date  . Varicose vein of leg   . Sleep apnea   . Decreased anal sphincter tone   . Insomnia   . Varicose veins     History  Substance Use Topics  . Smoking status: Never Smoker   . Smokeless tobacco: Never Used  . Alcohol Use: 0.0 oz/week    0 Not specified per week     Comment: wine at night    Family History  Problem Relation Age of Onset  . Heart disease Father     No Known Allergies   Current outpatient prescriptions:  .  fluticasone (FLONASE) 50 MCG/ACT nasal spray, Place 1 spray into both nostrils as needed for allergies or rhinitis (as needed for allergy symptoms)., Disp: , Rfl:  .  loratadine (CLARITIN) 10 MG tablet, Take 10 mg by mouth daily., Disp: , Rfl:  .  Multiple Vitamins-Minerals (CENTRUM SILVER ADULT 50+ PO), Take 1 tablet by mouth daily with breakfast., Disp: , Rfl:  .  SF 5000 PLUS 1.1 % CREA dental cream, Place 1 application onto teeth at bedtime. , Disp: , Rfl:  .  Warfarin Sodium (COUMADIN PO), Take by mouth as directed. Take as directed, Disp: , Rfl:  .  zolpidem (AMBIEN) 10 MG tablet, One tablet at bedtime, Disp: , Rfl:   BP 140/92 mmHg  Pulse 71  Resp 16  Ht 6' (1.829 m)  Wt 250 lb (113.399 kg)  BMI 33.90 kg/m2  Body mass index is 33.9 kg/(m^2).           Review of  Systems denies chest pain, orthopnea, hemoptysis, claudication. Has history of St. Jude heart valve replacement-on chronic Coumadin since 1983     Objective:   Physical Exam BP 140/92 mmHg  Pulse 71  Resp 16  Ht 6' (1.829 m)  Wt 250 lb (113.399 kg)  BMI 33.90 kg/m2  Gen.-alert and oriented x3 in no apparent distress HEENT normal for age Lungs no rhonchi or wheezing Cardiovascular regular rhythm , audible click from prosthetic valve, carotid pulses 3+ palpable no bruits audible Abdomen soft nontender no palpable masses Musculoskeletal free of  major deformities Skin clear -no rashes Neurologic normal Lower extremities 3+ femoral and dorsalis pedis pulses palpable bilaterally with no edema on right 1+ edema on left Large nest of bulging varicosities beginning at the inguinal crease on the left side extending medially and down the medial thigh into the medial calf with 1+ edema and nest of reticular veins distally.  Duplex scan at last visit revealed very short stump of left great saphenous vein supplying these bulging varicosities with gross reflux throughout     Assessment:     Recurrent varicosities left thigh and calf which are painful and affecting patient's daily living due to gross reflux proximal stump of great saphenous vein  status post previous ablation procedures 2 in [place] Prosthetic heart valve-on Coumadin therapy    Plan:     Patient needs ligation of great saphenous vein at saphenofemoral junction with removal of painful varicosities in left thigh and calf-greater than 20 stab phlebectomy Will proceed with precertification and organize the discontinuing of Coumadin preoperatively 5 days prior to surgery and Lovenox bridge if Dr. Irish Lack feels this is indicated.

## 2014-08-15 NOTE — Progress Notes (Signed)
Spoke with Dr. Tamala Julian about patient stating he was getting over a cold that he has had over last 2 weeks.  Patient states he still has little cough with yellow sputum but denies sob or diificulty breathing.  Patient is afebrile and O2 sat WNL. Per Dr. Tamala Julian we do not need CXR.

## 2014-08-19 ENCOUNTER — Encounter (HOSPITAL_COMMUNITY): Payer: Self-pay | Admitting: Vascular Surgery

## 2014-09-29 ENCOUNTER — Encounter: Payer: Self-pay | Admitting: Vascular Surgery

## 2014-09-30 ENCOUNTER — Ambulatory Visit (INDEPENDENT_AMBULATORY_CARE_PROVIDER_SITE_OTHER): Payer: Self-pay | Admitting: Vascular Surgery

## 2014-09-30 ENCOUNTER — Encounter: Payer: Self-pay | Admitting: Vascular Surgery

## 2014-09-30 VITALS — BP 153/101 | HR 72 | Resp 16 | Ht 72.0 in | Wt 250.0 lb

## 2014-09-30 DIAGNOSIS — I83892 Varicose veins of left lower extremities with other complications: Secondary | ICD-10-CM

## 2014-09-30 NOTE — Progress Notes (Signed)
Subjective:     Patient ID: Jeffery Whitehead, male   DOB: December 20, 1955, 59 y.o.   MRN: [id number]  HPI this 59 year old male returns for initial follow-up regarding his ligation of the saphenous vein at the saphenofemoral junction with multiple stab phlebectomy of painful varicosities in the left leg performed on  08/15/2014.Marland Kitchen Patient did not have long enough segment of saphenous vein to treat with laser ablation. He has had minimal discomfort in the left leg since the procedure. He's had no distal edema. He has no specific complaints.   Review of Systems     Objective:   Physical Exam BP 153/101 mmHg  Pulse 72  Resp 16  Ht 6' (1.829 m)  Wt 250 lb (113.399 kg)  BMI 33.90 kg/m2  Gen. well-developed well-nourished male no apparent stress alert and oriented 3 Left lower extremity with 3+ dorsalis pedis pulse palpable. No distal edema noted. Stab phlebectomy sites of healed nicely and left inguinal wound is also well-healed. No evidence of residual varicosities     Assessment:     Doing well 6 weeks post ligation saphenous vein at left saphenofemoral junction with multiple stab phlebectomy of painful varicosities    Plan:     Return on when necessary basis

## 2014-09-30 NOTE — Progress Notes (Signed)
Filed Vitals:   09/30/14 0856 09/30/14 0858  BP: 159/92 153/101  Pulse: 71 72  Resp: 16   Height: 6' (1.829 m)   Weight: 250 lb (113.399 kg)

## 2015-03-06 ENCOUNTER — Encounter: Payer: Self-pay | Admitting: Interventional Cardiology

## 2015-03-06 ENCOUNTER — Ambulatory Visit (INDEPENDENT_AMBULATORY_CARE_PROVIDER_SITE_OTHER): Payer: 59 | Admitting: Interventional Cardiology

## 2015-03-06 VITALS — BP 142/82 | HR 61 | Ht 72.0 in | Wt 252.4 lb

## 2015-03-06 DIAGNOSIS — G4733 Obstructive sleep apnea (adult) (pediatric): Secondary | ICD-10-CM

## 2015-03-06 DIAGNOSIS — R030 Elevated blood-pressure reading, without diagnosis of hypertension: Secondary | ICD-10-CM

## 2015-03-06 DIAGNOSIS — Z954 Presence of other heart-valve replacement: Secondary | ICD-10-CM

## 2015-03-06 DIAGNOSIS — I83892 Varicose veins of left lower extremities with other complications: Secondary | ICD-10-CM | POA: Diagnosis not present

## 2015-03-06 DIAGNOSIS — E663 Overweight: Secondary | ICD-10-CM | POA: Diagnosis not present

## 2015-03-06 DIAGNOSIS — Z952 Presence of prosthetic heart valve: Secondary | ICD-10-CM
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
## 2015-03-06 NOTE — Patient Instructions (Addendum)
Medication Instructions:  None  Labwork: None  Testing/Procedures: None  Follow-Up: Your physician wants you to follow-up in: 9 months with Dr. Irish Lack. You will receive a reminder letter in the mail two months in advance. If you don't receive a letter, please call our office to schedule the follow-up appointment.   Any Other Special Instructions Will Be Listed Below (If Applicable).  Your physician would like for you to check your blood pressure periodically at home and let our office know if your blood pressure is consistently higher than 140/90.

## 2015-06-09 ENCOUNTER — Encounter: Payer: Self-pay | Admitting: Emergency Medicine

## 2015-06-09 ENCOUNTER — Ambulatory Visit (INDEPENDENT_AMBULATORY_CARE_PROVIDER_SITE_OTHER): Payer: 59 | Admitting: Emergency Medicine

## 2015-06-09 VITALS — BP 132/98 | HR 74 | Ht 72.0 in | Wt 248.0 lb

## 2015-06-09 DIAGNOSIS — R0609 Other forms of dyspnea: Secondary | ICD-10-CM | POA: Diagnosis not present

## 2015-06-09 DIAGNOSIS — G4733 Obstructive sleep apnea (adult) (pediatric): Secondary | ICD-10-CM

## 2015-06-09 DIAGNOSIS — J45909 Unspecified asthma, uncomplicated: Secondary | ICD-10-CM | POA: Insufficient documentation

## 2015-06-09 NOTE — Assessment & Plan Note (Signed)
Currently using CPAP reliably. He is benefiting from it significantly. His AHI is 1.3. His leak is somewhat improved compared with last year. We will continue his current therapy.

## 2015-06-09 NOTE — Progress Notes (Signed)
Subjective:    Patient ID: Jeffery Whitehead, male    DOB: 1955/07/14, 59 y.o.   MRN: [id number]  HPI The patient is a 59 year old male who I have been asked to see for management of obstructive sleep apnea. The patient was diagnosed with moderate obstructive sleep apnea and 2013 while living in [place], and he had an AHI of 21 events per hour. He was started on C Pap with a titrated pressure to 9 cm, and has been wearing the device compliantly since that time. He currently has dated supplies, including his hoses, humidity chamber, and mask with headgear. The patient has a long-standing history of chronic sleep onset insomnia, for which she usually takes Ambien. The patient states that he sleeps well once he gets to sleep, and feels rested when he first arises in the morning. However, he does not feel energetic during the day, and makes the point that he does worse when wearing the device then not. However, he does not describe daytime sleepiness, and his Epworth score today is only 4. We have gotten a download today that shows excellent control of his AHI, good compliance, but he does have excessive mask leak. The patient states that his weight is up 35 pounds over the last 2 years.  ROV 06/09/15 -- follow up visit for OSA on CPAP 9 cm H2O since 2013. He was seen by Dr Gwenette Greet 05/2014. Also takes [place] for sleep onset insomnia. He returns today reporting that he is using reliably, almost every night. He has new equipment since last year - uses . He no longer snores, does feel that he benefits, better breathing. He has been feeling tired, but there are distractions at home and total sleep time is now decreased. He uses a nasal mask, has some occasional leak but not bad.  Download shows he is on auto-set mode, uses the machine > 4 hours 29 of 32 days in last month. AHI is 1.3, median leak 0 (95th pecentile 21). He reports that his wt is stable from last year. He has some exertional SOB. Is able to walk 3-4 miles a  day.     Review of Systems  Constitutional: Negative for fever and unexpected weight change.  HENT: Negative for congestion, dental problem, ear pain, nosebleeds, postnasal drip, rhinorrhea, sinus pressure, sneezing, sore throat and trouble swallowing.   Eyes: Negative for redness and itching.  Respiratory: Positive for shortness of breath. Negative for cough, chest tightness and wheezing.   Cardiovascular: Negative for palpitations and leg swelling.  Gastrointestinal: Negative for nausea and vomiting.  Genitourinary: Negative for dysuria.  Musculoskeletal: Negative for joint swelling.  Skin: Negative for rash.  Neurological: Negative for headaches.  Hematological: Does not bruise/bleed easily.  Psychiatric/Behavioral: Negative for dysphoric mood. The patient is not nervous/anxious.        Objective:   Physical Exam  Filed Vitals:   06/09/15 1611  BP: 132/98  Pulse: 74  Height: 6' (1.829 m)  Weight: 248 lb (112.492 kg)  SpO2: 98%    Constitutional:  Overweight male, no acute distress  HENT:  Nares patent without discharge  Oropharynx without exudate, palate and uvula are mildly elongated.   Eyes:  Perrla, eomi, no scleral icterus  Neck:  No JVD, no TMG  Cardiovascular:  Normal rate, regular rhythm, no rubs or gallops.  No murmurs        Intact distal pulses  Pulmonary :  Normal breath sounds, no stridor or respiratory distress   No rales,  rhonchi, or wheezing  Abdominal:  Soft, nondistended, bowel sounds present.  No tenderness noted.   Musculoskeletal:  No lower extremity edema noted.  Lymph Nodes:  No cervical lymphadenopathy noted  Skin:  No cyanosis noted  Neurologic:  Alert, appropriate, moves all 4 extremities without obvious deficit.        Assessment & Plan:  OSA (obstructive sleep apnea) Currently using CPAP reliably. He is benefiting from it significantly. His AHI is 1.3. His leak is somewhat improved compared with last year. We will continue  his current therapy.   Dyspnea on exertion he's noted this problem for at least a year. Can happen with exertion and with bending. He tells me that he has had pulmonary function testing in the past that was suspicious for asthma/. I will repeat his pulmonary function testing now, review this at  His next OV

## 2015-06-09 NOTE — Patient Instructions (Addendum)
Please continue your CPAP every night as you are using it We will check full pulmonary function testing at your next office visit.  Follow with Dr Lamonte Sakai next available with full PFT

## 2015-06-09 NOTE — Assessment & Plan Note (Signed)
he's noted this problem for at least a year. Can happen with exertion and with bending. He tells me that he has had pulmonary function testing in the past that was suspicious for asthma/. I will repeat his pulmonary function testing now, review this at  His next OV

## 2015-07-05 DIAGNOSIS — Z8679 Personal history of other diseases of the circulatory system: Secondary | ICD-10-CM

## 2015-07-05 HISTORY — DX: Personal history of other diseases of the circulatory system: Z86.79

## 2015-08-25 ENCOUNTER — Ambulatory Visit: Payer: 59 | Admitting: Emergency Medicine

## 2015-09-07 ENCOUNTER — Other Ambulatory Visit: Payer: Self-pay | Admitting: Emergency Medicine

## 2015-09-07 DIAGNOSIS — R0600 Dyspnea, unspecified: Secondary | ICD-10-CM

## 2015-09-08 ENCOUNTER — Ambulatory Visit (INDEPENDENT_AMBULATORY_CARE_PROVIDER_SITE_OTHER): Payer: 59 | Admitting: Emergency Medicine

## 2015-09-08 ENCOUNTER — Encounter: Payer: Self-pay | Admitting: Emergency Medicine

## 2015-09-08 VITALS — BP 118/78 | HR 72 | Ht 71.5 in | Wt 251.0 lb

## 2015-09-08 DIAGNOSIS — G4733 Obstructive sleep apnea (adult) (pediatric): Secondary | ICD-10-CM

## 2015-09-08 DIAGNOSIS — J4540 Moderate persistent asthma, uncomplicated: Secondary | ICD-10-CM

## 2015-09-08 DIAGNOSIS — R0600 Dyspnea, unspecified: Secondary | ICD-10-CM

## 2015-09-08 LAB — PULMONARY FUNCTION TEST
DL/VA % pred: 116 %
DL/VA: 5.5 ml/min/mmHg/L
DLCO COR % PRED: 127 %
DLCO COR: 43.82 ml/min/mmHg
DLCO unc % pred: 135 %
DLCO unc: 46.52 ml/min/mmHg
FEF 25-75 POST: 2.3 L/s
FEF 25-75 PRE: 1.51 L/s
FEF2575-%Change-Post: 52 %
FEF2575-%PRED-POST: 74 %
FEF2575-%Pred-Pre: 48 %
FEV1-%Change-Post: 22 %
FEV1-%Pred-Post: 87 %
FEV1-%Pred-Pre: 71 %
FEV1-POST: 3.33 L
FEV1-Pre: 2.71 L
FEV1FVC-%Change-Post: 12 %
FEV1FVC-%PRED-PRE: 71 %
FEV6-%CHANGE-POST: 8 %
FEV6-%PRED-POST: 110 %
FEV6-%Pred-Pre: 102 %
FEV6-POST: 5.29 L
FEV6-Pre: 4.89 L
FEV6FVC-%CHANGE-POST: 1 %
FEV6FVC-%PRED-POST: 103 %
FEV6FVC-%Pred-Pre: 102 %
FVC-%CHANGE-POST: 9 %
FVC-%PRED-POST: 109 %
FVC-%Pred-Pre: 99 %
FVC-POST: 5.49 L
FVC-Pre: 5.01 L
POST FEV1/FVC RATIO: 61 %
PRE FEV1/FVC RATIO: 54 %
PRE FEV6/FVC RATIO: 98 %
Post FEV6/FVC ratio: 99 %

## 2015-09-08 MED ORDER — ALBUTEROL SULFATE HFA 108 (90 BASE) MCG/ACT IN AERS: 2.0000 | INHALATION_SPRAY | Freq: Four times a day (QID) | RESPIRATORY_TRACT | Status: DC | PRN

## 2015-09-08 NOTE — Addendum Note (Signed)
Addended by: Desmond Dike C on: 09/08/2015 10:47 AM   Modules accepted: Orders

## 2015-09-08 NOTE — Progress Notes (Signed)
Subjective:    Patient ID: Jeffery Whitehead, male    DOB: Nov 13, 1955, 60 y.o.   MRN: [id number]  Shortness of Breath Pertinent negatives include no ear pain, fever, headaches, leg swelling, rash, rhinorrhea, sore throat, vomiting or wheezing.   The patient is a 60 year old male who I have been asked to see for management of obstructive sleep apnea. The patient was diagnosed with moderate obstructive sleep apnea and 2013 while living in [place], and he had an AHI of 21 events per hour. He was started on C Pap with a titrated pressure to 9 cm, and has been wearing the device compliantly since that time. He currently has dated supplies, including his hoses, humidity chamber, and mask with headgear. The patient has a long-standing history of chronic sleep onset insomnia, for which she usually takes Ambien. The patient states that he sleeps well once he gets to sleep, and feels rested when he first arises in the morning. However, he does not feel energetic during the day, and makes the point that he does worse when wearing the device then not. However, he does not describe daytime sleepiness, and his Epworth score today is only 4. We have gotten a download today that shows excellent control of his AHI, good compliance, but he does have excessive mask leak. The patient states that his weight is up 35 pounds over the last 2 years.  ROV 06/09/15 -- follow up visit for OSA on CPAP 9 cm H2O since 2013. He was seen by Dr Gwenette Greet 05/2014. Also takes [place] for sleep onset insomnia. He returns today reporting that he is using reliably, almost every night. He has new equipment since last year - uses . He no longer snores, does feel that he benefits, better breathing. He has been feeling tired, but there are distractions at home and total sleep time is now decreased. He uses a nasal mask, has some occasional leak but not bad.  Download shows he is on auto-set mode, uses the machine > 4 hours 29 of 32 days in last month. AHI  is 1.3, median leak 0 (95th pecentile 21). He reports that his wt is stable from last year. He has some exertional SOB. Is able to walk 3-4 miles a day.   ROV 09/08/15 -- patient follows up today for his history of obstructive sleep apnea which we are treating with CPAP. He also has sleep onset insomnia for which he uses Ambien. At our last visit we discussed dyspnea that has been persistent. Based on this we performed full pulmonary function testing today that I have personally reviewed. This shows moderate obstruction with an FEV1 of 71% predicted, vigorous bronchodilator response with a 22% improvement. His lung volumes are normal and his diffusion capacity is normal.      Review of Systems  Constitutional: Negative for fever and unexpected weight change.  HENT: Negative for congestion, dental problem, ear pain, nosebleeds, postnasal drip, rhinorrhea, sinus pressure, sneezing, sore throat and trouble swallowing.   Eyes: Negative for redness and itching.  Respiratory: Positive for shortness of breath. Negative for cough, chest tightness and wheezing.   Cardiovascular: Negative for palpitations and leg swelling.  Gastrointestinal: Negative for nausea and vomiting.  Genitourinary: Negative for dysuria.  Musculoskeletal: Negative for joint swelling.  Skin: Negative for rash.  Neurological: Negative for headaches.  Hematological: Does not bruise/bleed easily.  Psychiatric/Behavioral: Negative for dysphoric mood. The patient is not nervous/anxious.        Objective:   Physical Exam  Filed Vitals:   09/08/15 1006 09/08/15 1009  BP:  118/78  Pulse:  72  Height: 5' 11.5" (1.816 m)   Weight: 251 lb (113.853 kg)   SpO2:  97%    Constitutional:  Overweight male, no acute distress  HENT:  Nares patent without discharge  Oropharynx without exudate, palate and uvula are mildly elongated.   Eyes:  Perrla, eomi, no scleral icterus  Neck:  No JVD, no TMG  Cardiovascular:  Normal rate,  regular rhythm, no rubs or gallops.  No murmurs        Intact distal pulses  Pulmonary :  Normal breath sounds, no stridor or respiratory distress   No rales, rhonchi, or wheezing  Abdominal:  Soft, nondistended, bowel sounds present.  No tenderness noted.   Musculoskeletal:  No lower extremity edema noted.  Lymph Nodes:  No cervical lymphadenopathy noted  Skin:  No cyanosis noted  Neurologic:  Alert, appropriate, moves all 4 extremities without obvious deficit.        Assessment & Plan:  OSA (obstructive sleep apnea) Continue CPAP as he has been using. Continue Ambien  Asthma in adult Spirometry and clinical picture consistent with asthma. We will start him on Symbicort 160/4.5 g, teach him albuterol when necessary. Depending on his response we may decide to scale back to a daily ICS. Given his persistent symptoms I doubt that we will be able to use rescue bronchodilator alone. Follow-up in 4 months to assess his progress

## 2015-09-08 NOTE — Assessment & Plan Note (Signed)
Spirometry and clinical picture consistent with asthma. We will start him on Symbicort 160/4.5 g, teach him albuterol when necessary. Depending on his response we may decide to scale back to a daily ICS. Given his persistent symptoms I doubt that we will be able to use rescue bronchodilator alone. Follow-up in 4 months to assess his progress

## 2015-09-08 NOTE — Assessment & Plan Note (Signed)
Continue CPAP as he has been using. Continue Ambien

## 2015-09-08 NOTE — Patient Instructions (Signed)
We will start Symbicort 160/4.5 g, 2 puffs twice a day. Please be sure to rinse mouth and gargle after using this medication. Please use albuterol 2 puffs if needed for shortness of breath, cough, wheezing. Continue your CPAP every night Follow with Dr Lamonte Sakai in 4 months or sooner if you have any problems.

## 2015-09-08 NOTE — Progress Notes (Signed)
PFT done today. 

## 2015-10-11 DIAGNOSIS — E669 Obesity, unspecified: Secondary | ICD-10-CM | POA: Insufficient documentation

## 2015-10-11 DIAGNOSIS — I10 Essential (primary) hypertension: Secondary | ICD-10-CM | POA: Insufficient documentation

## 2015-10-23 ENCOUNTER — Telehealth: Payer: Self-pay | Admitting: Emergency Medicine

## 2015-10-23 MED ORDER — BUDESONIDE-FORMOTEROL FUMARATE 160-4.5 MCG/ACT IN AERO: 2.0000 | INHALATION_SPRAY | Freq: Two times a day (BID) | RESPIRATORY_TRACT | Status: DC

## 2015-10-23 NOTE — Telephone Encounter (Signed)
Patient Instructions     We will start Symbicort 160/4.5 g, 2 puffs twice a day. Please be sure to rinse mouth and gargle after using this medication. Please use albuterol 2 puffs if needed for shortness of breath, cough, wheezing. Continue your CPAP every night Follow with Dr Lamonte Sakai in 4 months or sooner if you have any problems.   Rx has been sent to pharmacy. Attempted to call patient but voicemail is full. Will need to confirm on Monday 10-26-15 that patient is aware of Rx being sent and/or has picked up from pharmacy.

## 2015-10-26 NOTE — Telephone Encounter (Signed)
Attempted to contact pt. Voice mail is still full. Will try back.

## 2015-10-27 NOTE — Telephone Encounter (Signed)
atc X3, no answer, vm full.  rx sent to pharmacy already. Will close message per triage protocol.

## 2016-02-09 ENCOUNTER — Encounter: Payer: Self-pay | Admitting: Emergency Medicine

## 2016-02-09 ENCOUNTER — Ambulatory Visit (INDEPENDENT_AMBULATORY_CARE_PROVIDER_SITE_OTHER): Payer: 59 | Admitting: Emergency Medicine

## 2016-02-09 DIAGNOSIS — J4540 Moderate persistent asthma, uncomplicated: Secondary | ICD-10-CM

## 2016-02-09 DIAGNOSIS — G4733 Obstructive sleep apnea (adult) (pediatric): Secondary | ICD-10-CM

## 2016-02-09 NOTE — Assessment & Plan Note (Signed)
We did a trial of Symbicort to see if he would benefit. He isn't sure that the Symbicort is helping him in any way. We will try stopping it to see if he misses it.

## 2016-02-09 NOTE — Assessment & Plan Note (Signed)
Continue current CPAP pressure. He has good compliance. He does have high leak and I would like for him to get a new chin strap. He will talk to [facility] about this and also about getting a new data card

## 2016-02-09 NOTE — Patient Instructions (Addendum)
Please continue your CPAP every night Speak with [facility] about getting a new data card and a new chinstrap for your CPAP.  Try stopping Symbicort for 2 weeks to see if you miss it. If so then we can restart it.  Continue fluticasone nasal spray as needed.  Follow with Dr Lamonte Sakai in 6 months or sooner if you have any problems

## 2016-02-09 NOTE — Progress Notes (Signed)
Subjective:    Patient ID: Jeffery Whitehead, male    DOB: Oct 10, 1955, 60 y.o.   MRN: [id number]  Shortness of Breath  Pertinent negatives include no ear pain, fever, headaches, leg swelling, rash, rhinorrhea, sore throat, vomiting or wheezing.   The patient is a 60 year old male who I have been asked to see for management of obstructive sleep apnea. The patient was diagnosed with moderate obstructive sleep apnea and 2013 while living in [place], and he had an AHI of 21 events per hour. He was started on C Pap with a titrated pressure to 9 cm, and has been wearing the device compliantly since that time. He currently has dated supplies, including his hoses, humidity chamber, and mask with headgear. The patient has a long-standing history of chronic sleep onset insomnia, for which she usually takes Ambien. The patient states that he sleeps well once he gets to sleep, and feels rested when he first arises in the morning. However, he does not feel energetic during the day, and makes the point that he does worse when wearing the device then not. However, he does not describe daytime sleepiness, and his Epworth score today is only 4. We have gotten a download today that shows excellent control of his AHI, good compliance, but he does have excessive mask leak. The patient states that his weight is up 35 pounds over the last 2 years.  ROV 06/09/15 -- follow up visit for OSA on CPAP 9 cm H2O since 2013. He was seen by Dr Gwenette Greet 05/2014. Also takes [place] for sleep onset insomnia. He returns today reporting that he is using reliably, almost every night. He has new equipment since last year - uses . He no longer snores, does feel that he benefits, better breathing. He has been feeling tired, but there are distractions at home and total sleep time is now decreased. He uses a nasal mask, has some occasional leak but not bad.  Download shows he is on auto-set mode, uses the machine > 4 hours 29 of 32 days in last month. AHI  is 1.3, median leak 0 (95th pecentile 21). He reports that his wt is stable from last year. He has some exertional SOB. Is able to walk 3-4 miles a day.   ROV 09/08/15 -- patient follows up today for his history of obstructive sleep apnea which we are treating with CPAP. He also has sleep onset insomnia for which he uses Ambien. At our last visit we discussed dyspnea that has been persistent. Based on this we performed full pulmonary function testing today that I have personally reviewed. This shows moderate obstruction with an FEV1 of 71% predicted, vigorous bronchodilator response with a 22% improvement. His lung volumes are normal and his diffusion capacity is normal.    ROV 02/09/16 -- Patient has a history of obstructive sleep apnea on CPAP as well as insomnia for which he uses Ambien. Also with a history of dyspnea. He has obstructive lung disease noted on his pulmonary function testing with a bronchodilator response consistent with an asthmatic pattern.   He feels that he is having some leak, the download confirms this - the device has changed and the chin strap isn't working as well.  He is compliant with the Symbicort, states that his exertional SOB may be slightly better. He admits that he hasn't been as active lately.  He is compliant with his CPAP. I reviewed the download information for the last 30 days. He uses his CPAP 29 out  of 30 days (97%), 73% of the time greater than 4 hours, 23% of the time less than 4 hours. He has clear benefit with less daytime sleepiness, more energy, better sleep quality. He does have significant leak when he is at higher pressures, 23.9 L/m.     Review of Systems  Constitutional: Negative for fever and unexpected weight change.  HENT: Negative for congestion, dental problem, ear pain, nosebleeds, postnasal drip, rhinorrhea, sinus pressure, sneezing, sore throat and trouble swallowing.   Eyes: Negative for redness and itching.  Respiratory: Positive for shortness of  breath. Negative for cough, chest tightness and wheezing.   Cardiovascular: Negative for palpitations and leg swelling.  Gastrointestinal: Negative for nausea and vomiting.  Genitourinary: Negative for dysuria.  Musculoskeletal: Negative for joint swelling.  Skin: Negative for rash.  Neurological: Negative for headaches.  Hematological: Does not bruise/bleed easily.  Psychiatric/Behavioral: Negative for dysphoric mood. The patient is not nervous/anxious.        Objective:   Physical Exam  Vitals:   02/09/16 1637  BP: 102/66  Pulse: 68  SpO2: 95%  Weight: 256 lb (116.1 kg)  Height: 6' (1.829 m)    Constitutional:  Overweight male, no acute distress  HENT:  Nares patent without discharge  Oropharynx without exudate, palate and uvula are mildly elongated.   Eyes:  Perrla, eomi, no scleral icterus  Neck:  No JVD, no TMG  Cardiovascular:  Normal rate, regular rhythm, no rubs or gallops.  No murmurs        Intact distal pulses  Pulmonary :  Normal breath sounds, no stridor or respiratory distress   No rales, rhonchi, or wheezing  Musculoskeletal:  No lower extremity edema noted.  Lymph Nodes:  No cervical lymphadenopathy noted  Skin:  No cyanosis noted  Neurologic:  Alert, appropriate, moves all 4 extremities without obvious deficit.        Assessment & Plan:  Asthma in adult We did a trial of Symbicort to see if he would benefit. He isn't sure that the Symbicort is helping him in any way. We will try stopping it to see if he misses it.   OSA (obstructive sleep apnea) Continue current CPAP pressure. He has good compliance. He does have high leak and I would like for him to get a new chin strap. He will talk to [facility] about this and also about getting a new data card  Baltazar Apo, MD, PhD 02/09/2016, 5:15 PM [facility] [phone] or if no answer [phone]

## 2016-03-04 ENCOUNTER — Telehealth: Payer: Self-pay | Admitting: Emergency Medicine

## 2016-03-04 NOTE — Telephone Encounter (Signed)
Called spoke with pt. He states that he stopped his symbicort per RB's recs at his last ov but since has restarted it and feels that it has improved his breathing. He states the would like a message sent to RB as an to make him aware that he is back on the symbicort. I explained to him that RB was out of the office but that I would send the message. He voiced understanding and had no further questions.   Will send as FYI to [place]
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
## 2016-03-08 ENCOUNTER — Ambulatory Visit (INDEPENDENT_AMBULATORY_CARE_PROVIDER_SITE_OTHER): Payer: 59 | Admitting: Interventional Cardiology

## 2016-03-08 ENCOUNTER — Encounter: Payer: Self-pay | Admitting: Interventional Cardiology

## 2016-03-08 VITALS — BP 132/89 | HR 70 | Ht 72.0 in | Wt 263.8 lb

## 2016-03-08 DIAGNOSIS — Z954 Presence of other heart-valve replacement: Secondary | ICD-10-CM

## 2016-03-08 DIAGNOSIS — Z13220 Encounter for screening for lipoid disorders: Secondary | ICD-10-CM

## 2016-03-08 DIAGNOSIS — I10 Essential (primary) hypertension: Secondary | ICD-10-CM

## 2016-03-08 DIAGNOSIS — Z952 Presence of prosthetic heart valve: Secondary | ICD-10-CM

## 2016-03-08 DIAGNOSIS — E669 Obesity, unspecified: Secondary | ICD-10-CM

## 2016-03-08 LAB — COMPREHENSIVE METABOLIC PANEL
ALK PHOS: 68 U/L (ref 40–115)
ALT: 29 U/L (ref 9–46)
AST: 25 U/L (ref 10–35)
Albumin: 4.3 g/dL (ref 3.6–5.1)
BILIRUBIN TOTAL: 0.5 mg/dL (ref 0.2–1.2)
BUN: 17 mg/dL (ref 7–25)
CO2: 25 mmol/L (ref 20–31)
CREATININE: 0.84 mg/dL (ref 0.70–1.25)
Calcium: 9.2 mg/dL (ref 8.6–10.3)
Chloride: 105 mmol/L (ref 98–110)
GLUCOSE: 111 mg/dL — AB (ref 65–99)
Potassium: 4.6 mmol/L (ref 3.5–5.3)
Sodium: 140 mmol/L (ref 135–146)
Total Protein: 7 g/dL (ref 6.1–8.1)

## 2016-03-08 LAB — LIPID PANEL
CHOL/HDL RATIO: 3.7 ratio (ref <=5.0)
Cholesterol: 174 mg/dL (ref 125–200)
HDL: 47 mg/dL (ref >=40)
LDL Cholesterol: 96 mg/dL (ref <130)
Triglycerides: 157 mg/dL — ABNORMAL HIGH (ref <150)
VLDL: 31 mg/dL — ABNORMAL HIGH (ref <30)

## 2016-03-08 NOTE — Telephone Encounter (Signed)
Thank you. Left him know that I agree with this plan. Nothing else needed.

## 2016-03-08 NOTE — Patient Instructions (Signed)
**Note De-identified  Obfuscation** Medication Instructions:  Same-no changes  Labwork: CMET and Lipids today  Testing/Procedures: None  Follow-Up: Your physician wants you to follow-up in: 1 year. You will receive a reminder letter in the mail two months in advance. If you don't receive a letter, please call our office to schedule the follow-up appointment.     If you need a refill on your cardiac medications before your next appointment, please call your pharmacy.   

## 2016-04-14 DIAGNOSIS — E782 Mixed hyperlipidemia: Secondary | ICD-10-CM | POA: Insufficient documentation

## 2016-04-14 DIAGNOSIS — K219 Gastro-esophageal reflux disease without esophagitis: Secondary | ICD-10-CM | POA: Insufficient documentation

## 2016-04-29 ENCOUNTER — Telehealth: Payer: Self-pay

## 2016-04-29 NOTE — Telephone Encounter (Signed)
**Note De-Identified  Obfuscation** I have faxed this message back to [facility] at [phone]. I did receive confirmation that the fax was successful.

## 2016-04-29 NOTE — Telephone Encounter (Signed)
Agree with Dr. Onnie Graham.

## 2016-04-29 NOTE — Telephone Encounter (Signed)
**Note De-Identified  Obfuscation** The pt needs to have a colonoscopy with [facility]. They are requesting that the pt stop taking his Warfarin 5 days prior to procedure.  Please advise.

## 2016-04-29 NOTE — Telephone Encounter (Signed)
Pt takes Coumadin for AVR with no other risk factors for stroke. Ok to hold Coumadin for 5 days prior to procedure. Of note, we do not manage his Coumadin - would have pt contact his MD who manages Coumadin to let them know he will be holding for 5 days.

## 2016-06-10 ENCOUNTER — Other Ambulatory Visit: Payer: Self-pay | Admitting: Emergency Medicine

## 2016-07-04 DIAGNOSIS — K760 Fatty (change of) liver, not elsewhere classified: Secondary | ICD-10-CM

## 2016-07-04 DIAGNOSIS — B9561 Methicillin susceptible Staphylococcus aureus infection as the cause of diseases classified elsewhere: Secondary | ICD-10-CM

## 2016-07-04 HISTORY — DX: Methicillin susceptible Staphylococcus aureus infection as the cause of diseases classified elsewhere: B95.61

## 2016-07-04 HISTORY — DX: Fatty (change of) liver, not elsewhere classified: K76.0

## 2016-07-12 ENCOUNTER — Telehealth: Payer: Self-pay | Admitting: Emergency Medicine

## 2016-07-12 NOTE — Telephone Encounter (Signed)
Called and spoke with pt and he stated that he is having hard time getting a chin strap that works for him  He has had a hard time getting in touch with someone to get the return of his chin strap.  I have called [employer] and spoke with respiratory and they will contact the pt to advise on the chin straps and how to return the ones that he has.

## 2016-08-17 ENCOUNTER — Inpatient Hospital Stay (HOSPITAL_COMMUNITY)
Admission: EM | Admit: 2016-08-17 | Discharge: 2016-08-26 | DRG: 314 | Disposition: A | Discharge status: Skilled Nursing Facility | Payer: 59 | Attending: Internal Medicine | Admitting: Internal Medicine

## 2016-08-17 ENCOUNTER — Encounter (HOSPITAL_COMMUNITY): Payer: Self-pay | Admitting: Emergency Medicine

## 2016-08-17 ENCOUNTER — Emergency Department (HOSPITAL_COMMUNITY): Payer: 59

## 2016-08-17 DIAGNOSIS — A4101 Sepsis due to Methicillin susceptible Staphylococcus aureus: Secondary | ICD-10-CM | POA: Diagnosis present

## 2016-08-17 DIAGNOSIS — I10 Essential (primary) hypertension: Secondary | ICD-10-CM | POA: Diagnosis present

## 2016-08-17 DIAGNOSIS — E876 Hypokalemia: Secondary | ICD-10-CM | POA: Diagnosis present

## 2016-08-17 DIAGNOSIS — I38 Endocarditis, valve unspecified: Secondary | ICD-10-CM

## 2016-08-17 DIAGNOSIS — R509 Fever, unspecified: Secondary | ICD-10-CM | POA: Diagnosis not present

## 2016-08-17 DIAGNOSIS — R0602 Shortness of breath: Secondary | ICD-10-CM

## 2016-08-17 DIAGNOSIS — Z7901 Long term (current) use of anticoagulants: Secondary | ICD-10-CM

## 2016-08-17 DIAGNOSIS — I330 Acute and subacute infective endocarditis: Secondary | ICD-10-CM | POA: Diagnosis present

## 2016-08-17 DIAGNOSIS — Z8249 Family history of ischemic heart disease and other diseases of the circulatory system: Secondary | ICD-10-CM

## 2016-08-17 DIAGNOSIS — Y831 Surgical operation with implant of artificial internal device as the cause of abnormal reaction of the patient, or of later complication, without mention of misadventure at the time of the procedure: Secondary | ICD-10-CM | POA: Diagnosis present

## 2016-08-17 DIAGNOSIS — G9340 Encephalopathy, unspecified: Secondary | ICD-10-CM

## 2016-08-17 DIAGNOSIS — R69 Illness, unspecified: Secondary | ICD-10-CM

## 2016-08-17 DIAGNOSIS — R7989 Other specified abnormal findings of blood chemistry: Secondary | ICD-10-CM

## 2016-08-17 DIAGNOSIS — G9341 Metabolic encephalopathy: Secondary | ICD-10-CM | POA: Diagnosis present

## 2016-08-17 DIAGNOSIS — J111 Influenza due to unidentified influenza virus with other respiratory manifestations: Secondary | ICD-10-CM | POA: Insufficient documentation

## 2016-08-17 DIAGNOSIS — G4733 Obstructive sleep apnea (adult) (pediatric): Secondary | ICD-10-CM | POA: Diagnosis present

## 2016-08-17 DIAGNOSIS — R2681 Unsteadiness on feet: Secondary | ICD-10-CM

## 2016-08-17 DIAGNOSIS — J4540 Moderate persistent asthma, uncomplicated: Secondary | ICD-10-CM | POA: Diagnosis present

## 2016-08-17 DIAGNOSIS — M4646 Discitis, unspecified, lumbar region: Secondary | ICD-10-CM | POA: Diagnosis present

## 2016-08-17 DIAGNOSIS — Z952 Presence of prosthetic heart valve: Secondary | ICD-10-CM

## 2016-08-17 DIAGNOSIS — I959 Hypotension, unspecified: Secondary | ICD-10-CM | POA: Diagnosis present

## 2016-08-17 DIAGNOSIS — M549 Dorsalgia, unspecified: Secondary | ICD-10-CM

## 2016-08-17 DIAGNOSIS — D696 Thrombocytopenia, unspecified: Secondary | ICD-10-CM | POA: Diagnosis present

## 2016-08-17 DIAGNOSIS — J45909 Unspecified asthma, uncomplicated: Secondary | ICD-10-CM | POA: Diagnosis present

## 2016-08-17 DIAGNOSIS — D649 Anemia, unspecified: Secondary | ICD-10-CM | POA: Diagnosis present

## 2016-08-17 DIAGNOSIS — R945 Abnormal results of liver function studies: Secondary | ICD-10-CM

## 2016-08-17 DIAGNOSIS — T826XXA Infection and inflammatory reaction due to cardiac valve prosthesis, initial encounter: Principal | ICD-10-CM | POA: Diagnosis present

## 2016-08-17 DIAGNOSIS — Z7951 Long term (current) use of inhaled steroids: Secondary | ICD-10-CM

## 2016-08-17 DIAGNOSIS — R6520 Severe sepsis without septic shock: Secondary | ICD-10-CM | POA: Diagnosis present

## 2016-08-17 DIAGNOSIS — M4647 Discitis, unspecified, lumbosacral region: Secondary | ICD-10-CM | POA: Diagnosis present

## 2016-08-17 LAB — CBC WITH DIFFERENTIAL/PLATELET
BASOS ABS: 0 10*3/uL (ref 0.0–0.1)
BASOS PCT: 0 %
EOS ABS: 0 10*3/uL (ref 0.0–0.7)
Eosinophils Relative: 0 %
HEMATOCRIT: 34.8 % — AB (ref 39.0–52.0)
HEMOGLOBIN: 11.5 g/dL — AB (ref 13.0–17.0)
Lymphocytes Relative: 2 %
Lymphs Abs: 0.3 10*3/uL — ABNORMAL LOW (ref 0.7–4.0)
MCH: 27.8 pg (ref 26.0–34.0)
MCHC: 33 g/dL (ref 30.0–36.0)
MCV: 84.1 fL (ref 78.0–100.0)
MONOS PCT: 2 %
Monocytes Absolute: 0.2 10*3/uL (ref 0.1–1.0)
NEUTROS ABS: 11.9 10*3/uL — AB (ref 1.7–7.7)
NEUTROS PCT: 96 %
Platelets: 143 10*3/uL — ABNORMAL LOW (ref 150–400)
RBC: 4.14 MIL/uL — AB (ref 4.22–5.81)
RDW: 13.5 % (ref 11.5–15.5)
WBC: 12.4 10*3/uL — ABNORMAL HIGH (ref 4.0–10.5)

## 2016-08-17 LAB — COMPREHENSIVE METABOLIC PANEL
ALT: 59 U/L (ref 17–63)
AST: 72 U/L — ABNORMAL HIGH (ref 15–41)
Albumin: 3.8 g/dL (ref 3.5–5.0)
Alkaline Phosphatase: 62 U/L (ref 38–126)
Anion gap: 7 (ref 5–15)
BUN: 19 mg/dL (ref 6–20)
CO2: 22 mmol/L (ref 22–32)
Calcium: 7.8 mg/dL — ABNORMAL LOW (ref 8.9–10.3)
Chloride: 106 mmol/L (ref 101–111)
Creatinine, Ser: 1 mg/dL (ref 0.61–1.24)
GFR calc Af Amer: >60 mL/min (ref >60)
GFR calc non Af Amer: >60 mL/min (ref >60)
Glucose, Bld: 135 mg/dL — ABNORMAL HIGH (ref 65–99)
Potassium: 2.9 mmol/L — ABNORMAL LOW (ref 3.5–5.1)
Sodium: 135 mmol/L (ref 135–145)
Total Bilirubin: 1.6 mg/dL — ABNORMAL HIGH (ref 0.3–1.2)
Total Protein: 6.4 g/dL — ABNORMAL LOW (ref 6.5–8.1)

## 2016-08-17 LAB — PROTIME-INR
INR: 2.67
Prothrombin Time: 29 seconds — ABNORMAL HIGH (ref 11.4–15.2)

## 2016-08-17 LAB — I-STAT CG4 LACTIC ACID, ED: LACTIC ACID, VENOUS: 1.08 mmol/L (ref 0.5–1.9)

## 2016-08-17 MED ORDER — POTASSIUM CHLORIDE CRYS ER 20 MEQ PO TBCR
40.0000 meq | EXTENDED_RELEASE_TABLET | Freq: Once | ORAL | Status: AC
  Administered 2016-08-18: 40 meq via ORAL
  Filled 2016-08-17: qty 2

## 2016-08-17 MED ORDER — MAGNESIUM SULFATE 2 GM/50ML IV SOLN
2.0000 g | Freq: Once | INTRAVENOUS | Status: AC
  Administered 2016-08-18: 2 g via INTRAVENOUS
  Filled 2016-08-17: qty 50

## 2016-08-17 MED ORDER — DEXTROSE 5 % IV SOLN
2.0000 g | Freq: Once | INTRAVENOUS | Status: AC
  Administered 2016-08-17: 2 g via INTRAVENOUS
  Filled 2016-08-17: qty 2

## 2016-08-17 MED ORDER — VANCOMYCIN HCL IN DEXTROSE 1-5 GM/200ML-% IV SOLN
1000.0000 mg | Freq: Once | INTRAVENOUS | Status: AC
  Administered 2016-08-17: 1000 mg via INTRAVENOUS
  Filled 2016-08-17: qty 200

## 2016-08-17 MED ORDER — OSELTAMIVIR PHOSPHATE 75 MG PO CAPS: 75.0000 mg | ORAL_CAPSULE | Freq: Every day | ORAL | Status: DC

## 2016-08-17 MED ORDER — SALINE SPRAY 0.65 % NA SOLN
1.0000 | Freq: Once | NASAL | Status: AC
  Administered 2016-08-17: 1 via NASAL
  Filled 2016-08-17: qty 44

## 2016-08-17 NOTE — ED Notes (Signed)
Pt states that nose is extremely dry and he cannot breath through his nose.

## 2016-08-17 NOTE — ED Triage Notes (Addendum)
Pt from home with complaints of fever, dry sinuses, and generalized body aches x 3 days. Pt has also had 2 episodes of diarrhea today . Pt has nausea without emesis. EMS gave 1000mg  of tylenol for a fever of 104, 1500 ml NS, and 4 mg of zofran. Pt still reports nausea. Pt states he has had difficulty breathing x 3 days, however pt is at 97% on RA. Pt has asked 3 times in triage if today is Wednesday

## 2016-08-17 NOTE — ED Provider Notes (Signed)
[facility] DEPT Provider Note   CSN: [id number] Arrival date & time: 08/17/16  2024     History   Chief Complaint Chief Complaint  Patient presents with  . Fever  . Generalized Body Aches  . Facial Pain  . Diarrhea    HPI Jeffery Whitehead is a 61 y.o. male.  HPI Pt comes in with cc of AMS. Pt has hx of heart valve replacement, on coumadin. He has no other significant medical hx. Pt arrived via EMS, after wife called 22 from [place]. Wife, Jeffery Whitehead, was called at [phone]. She reported to me that her husband has been feeling unwell for the last 2-3 days. She has been telling him to go to the doctor, but he has been delaying care. Today when she called him at 7 pm, pt was confused, he didn't know it was evening, he was unaware that it was 7 in the evening, he had not taken the dogs out all day, pt was unaware that his grand child might be in the home and he sounded slurred and confused. EMS reported that pt had a temp of 104 and he was tachycardic and complaining of dib when they arrived. Pt was given 1000 mg tylenol by them and 1500 cc of fluids. Pt arrived to the ER somnolent, but arousable and he is oriented x 3. Pt reports that he has been having cold and congestion like symptoms for the past 3 days. The last 11 day he has been having dizziness and unsteadiness every time he walks. Pt denies falls. He thinks he has been taking his meds as prescribed.  Past Medical History:  Diagnosis Date  . Cold   . Decreased anal sphincter tone   . Heart valve replaced    1983   st jude  . Insomnia   . Shortness of breath dyspnea   . Sleep apnea    cpap  . Varicose vein of leg   . Varicose veins     Patient Active Problem List   Diagnosis Date Noted  . Influenza-like illness 08/17/2016  . Asthma in adult 06/09/2015  . S/P AVR (aortic valve replacement) 06/19/2014  . Overweight 06/19/2014  . OSA (obstructive sleep apnea) 05/21/2014  . Varicose veins of leg with  complications [id number]    Past Surgical History:  Procedure Laterality Date  . AORTIC VALVE REPLACEMENT  1983  . TONSILLECTOMY    . VEIN LIGATION AND STRIPPING Left 08/15/2014   Procedure: SAPHENOUS VEIN LIGATION AND EXCISION OF VARICOSITIES;  Surgeon: Mal Misty, MD;  Location: [facility];  Service: Vascular;  Laterality: Left;       Home Medications    Prior to Admission medications   Medication Sig Start Date End Date Taking? Authorizing Provider  budesonide-formoterol (SYMBICORT) 160-4.5 MCG/ACT inhaler Inhale 2 puffs into the lungs 2 times daily 06/10/16  Yes [name], MD  fluticasone [facility]) 50 MCG/ACT nasal spray Place 1 spray into both nostrils as needed for allergies or rhinitis (as needed for allergy symptoms).   Yes Historical Provider, MD  lisinopril (PRINIVIL,ZESTRIL) 20 MG tablet Take 20 mg by mouth daily.   Yes Historical Provider, MD  loratadine (CLARITIN) 10 MG tablet Take 10 mg by mouth daily as needed for allergies.    Yes Historical Provider, MD  Multiple Vitamins-Minerals (CENTRUM SILVER ADULT 50+ PO) Take 1 tablet by mouth daily with breakfast.   Yes Historical Provider, MD  omeprazole (PRILOSEC) 20 MG capsule Take 1 capsule by mouth  daily. 07/16/16  Yes Historical Provider, MD  SF 5000 PLUS 1.1 % CREA dental cream Place 1 application onto teeth at bedtime as needed (dental hygiene).  06/03/14  Yes Historical Provider, MD  warfarin (COUMADIN) 1 MG tablet Take 0.5 mg by mouth daily. On wednesdays 04/14/16  Yes Historical Provider, MD  warfarin (COUMADIN) 7.5 MG tablet Take 7.5 mg by mouth as directed. Take 7.5 MG on Sunday, Monday, Tuesday, Thursday, Friday, Saturday. Take 8 MG on Wednesday 02/24/16  Yes Historical Provider, MD  zolpidem (AMBIEN) 10 MG tablet Take 10 mg by mouth at bedtime.  05/14/14  Yes Historical Provider, MD  albuterol (PROVENTIL HFA;VENTOLIN HFA) 108 (90 Base) MCG/ACT inhaler Inhale 2 puffs into the lungs every 6 (six) hours as needed for  wheezing or shortness of breath. 09/08/15   [name], MD    Family History Family History  Problem Relation Age of Onset  . Heart disease Father     Social History Social History  Substance Use Topics  . Smoking status: Never Smoker  . Smokeless tobacco: Never Used  . Alcohol use 0.0 oz/week     Comment: wine at night     Allergies   Patient has no known allergies.   Review of Systems Review of Systems   ROS 10 Systems reviewed and are negative for acute change except as noted in the HPI.     Physical Exam Updated Vital Signs BP 126/65   Pulse 89   Temp 99.3 F (37.4 C) (Oral)   Resp 22   Ht 6' (1.829 m)   Wt 255 lb (115.7 kg)   SpO2 100%   BMI 34.58 kg/m   Physical Exam  Constitutional: He is oriented to person, place, and time. He appears well-developed and well-nourished.  HENT:  Head: Normocephalic and atraumatic.  Eyes: Conjunctivae and EOM are normal. Pupils are equal, round, and reactive to light.  Saccadic eye movement with horizontal gaze  Neck: Normal range of motion. Neck supple.  Cardiovascular: Normal rate and regular rhythm.   Murmur heard. Pulmonary/Chest: Effort normal and breath sounds normal. No respiratory distress. He has no wheezes.  Abdominal: Soft. Bowel sounds are normal. He exhibits no distension. There is no tenderness. There is no rebound and no guarding.  Neurological: He is oriented to person, place, and time. No cranial nerve deficit or sensory deficit. Coordination normal.  Cerebellar exam is normal (finger to nose and heel to shin) Sensory exam normal for bilateral upper and lower extremities - and patient is able to discriminate between sharp and dull. Motor exam is 4+/5   Skin: Skin is warm.  Nursing note and vitals reviewed.    ED Treatments / Results  Labs (all labs ordered are listed, but only abnormal results are displayed) Labs Reviewed  COMPREHENSIVE METABOLIC PANEL - Abnormal; Notable for the  following:       Result Value   Potassium 2.9 (*)    Glucose, Bld 135 (*)    Calcium 7.8 (*)    Total Protein 6.4 (*)    AST 72 (*)    Total Bilirubin 1.6 (*)    All other components within normal limits  CBC WITH DIFFERENTIAL/PLATELET - Abnormal; Notable for the following:    WBC 12.4 (*)    RBC 4.14 (*)    Hemoglobin 11.5 (*)    HCT 34.8 (*)    Platelets 143 (*)    Neutro Abs 11.9 (*)    Lymphs Abs 0.3 (*)  All other components within normal limits  PROTIME-INR - Abnormal; Notable for the following:    Prothrombin Time 29.0 (*)    All other components within normal limits  CULTURE, BLOOD (ROUTINE X 2)  CULTURE, BLOOD (ROUTINE X 2)  URINALYSIS, ROUTINE W REFLEX MICROSCOPIC  INFLUENZA PANEL BY PCR (TYPE A & B)  I-STAT CG4 LACTIC ACID, ED  I-STAT CG4 LACTIC ACID, ED    EKG  EKG Interpretation None       Radiology Dg Chest 2 View  Result Date: 08/17/2016 CLINICAL DATA:  Body aches and fever EXAM: CHEST  2 VIEW COMPARISON:  None. FINDINGS: Cardiomediastinal silhouette is upper limits normal. No focal airspace consolidation or pulmonary edema. No pneumothorax or pleural effusion. IMPRESSION: No active cardiopulmonary disease. Electronically Signed   By: Ulyses Jarred M.D.   On: 08/17/2016 22:24   Ct Head Wo Contrast  Result Date: 08/17/2016 CLINICAL DATA:  Altered mental status and dizziness EXAM: CT HEAD WITHOUT CONTRAST TECHNIQUE: Contiguous axial images were obtained from the base of the skull through the vertex without intravenous contrast. COMPARISON:  None. FINDINGS: Brain: No mass lesion, intraparenchymal hemorrhage or extra-axial collection. No evidence of acute cortical infarct. Brain parenchyma and CSF-containing spaces are normal for age. Vascular: No hyperdense vessel or unexpected calcification. Skull: Normal visualized skull base, calvarium and extracranial soft tissues. Sinuses/Orbits: No sinus fluid levels or advanced mucosal thickening. No mastoid effusion.  Normal orbits. IMPRESSION: Normal CT of the brain. Electronically Signed   By: Ulyses Jarred M.D.   On: 08/17/2016 22:03    Procedures Procedures (including critical care time)  Medications Ordered in ED Medications  vancomycin (VANCOCIN) IVPB 1000 mg/200 mL premix (1,000 mg Intravenous New Bag/Given 08/17/16 2336)  oseltamivir (TAMIFLU) capsule 75 mg (not administered)  magnesium sulfate IVPB 2 g 50 mL (2 g Intravenous New Bag/Given 08/18/16 0001)  potassium chloride SA (K-DUR,KLOR-CON) CR tablet 40 mEq (not administered)  sodium chloride (OCEAN) 0.65 % nasal spray 1 spray (1 spray Each Nare Given 08/17/16 2338)  cefTRIAXone (ROCEPHIN) 2 g in dextrose 5 % 50 mL IVPB (0 g Intravenous Stopped 08/18/16 0001)  potassium chloride SA (K-DUR,KLOR-CON) CR tablet 40 mEq (40 mEq Oral Given 08/18/16 0001)     Initial Impression / Assessment and Plan / ED Course  I have reviewed the triage vital signs and the nursing notes.  Pertinent labs & imaging results that were available during my care of the patient were reviewed by me and considered in my medical decision making (see chart for details).     Pt comes in with cc of AMS. Pt has hx of AV replacement, he is on coumadin. Pt is immunocompetent. Pt has been having URI like symptoms x 2-3 days.   ICH / Stroke ACS Sepsis syndrome Infection - UTI/Pneumonia/ Influenza Encephalopathy Electrolyte abnormality Drug overdose Metabolic disorders including thyroid disorders, adrenal insufficiency Cancer of unknown origin / paraneoplastic process Hypercapnia / COPD Hypoxia   Neuro exam - there was saccadic eye movement, otherwise it was non focal. With the metallic valve, pt is certainly at risk for embolic strokes - but pretest probability currently is low.  Pt has no meningismus on exam and he is imunocompetent - so I dont think Meningitis is likely. Pt needs broad spectrum antibiotics as his diagnosis is evolving - so we will give him 2 grams  ceftriaxone.   With his dizziness, ams and him being on coumadin - we will get CT head to ensure there is no brain  bleed. I spoke with Neurology - and they recommend treating patient as  Flu like illness, maybe encephalopathy right now - and get MRI if the patient is not improving to look for occult stroke.  Wife updated. Pt will be admitted.  Final Clinical Impressions(s) / ED Diagnoses   Final diagnoses:  Encephalopathy  Unsteady gait    New Prescriptions New Prescriptions   No medications on file     Varney Biles, MD 08/18/16 0007

## 2016-08-18 ENCOUNTER — Encounter (HOSPITAL_COMMUNITY): Payer: Self-pay | Admitting: Family Medicine

## 2016-08-18 ENCOUNTER — Observation Stay (HOSPITAL_COMMUNITY): Payer: 59

## 2016-08-18 ENCOUNTER — Ambulatory Visit: Payer: 59 | Admitting: Emergency Medicine

## 2016-08-18 DIAGNOSIS — Z7951 Long term (current) use of inhaled steroids: Secondary | ICD-10-CM | POA: Diagnosis not present

## 2016-08-18 DIAGNOSIS — Z952 Presence of prosthetic heart valve: Secondary | ICD-10-CM | POA: Diagnosis not present

## 2016-08-18 DIAGNOSIS — T826XXD Infection and inflammatory reaction due to cardiac valve prosthesis, subsequent encounter: Secondary | ICD-10-CM | POA: Diagnosis not present

## 2016-08-18 DIAGNOSIS — Z95828 Presence of other vascular implants and grafts: Secondary | ICD-10-CM | POA: Diagnosis not present

## 2016-08-18 DIAGNOSIS — R69 Illness, unspecified: Secondary | ICD-10-CM | POA: Diagnosis not present

## 2016-08-18 DIAGNOSIS — R2681 Unsteadiness on feet: Secondary | ICD-10-CM | POA: Diagnosis not present

## 2016-08-18 DIAGNOSIS — E876 Hypokalemia: Secondary | ICD-10-CM | POA: Diagnosis present

## 2016-08-18 DIAGNOSIS — I38 Endocarditis, valve unspecified: Secondary | ICD-10-CM | POA: Diagnosis not present

## 2016-08-18 DIAGNOSIS — I10 Essential (primary) hypertension: Secondary | ICD-10-CM | POA: Diagnosis present

## 2016-08-18 DIAGNOSIS — D649 Anemia, unspecified: Secondary | ICD-10-CM | POA: Diagnosis present

## 2016-08-18 DIAGNOSIS — Z8249 Family history of ischemic heart disease and other diseases of the circulatory system: Secondary | ICD-10-CM | POA: Diagnosis not present

## 2016-08-18 DIAGNOSIS — Z7901 Long term (current) use of anticoagulants: Secondary | ICD-10-CM | POA: Diagnosis not present

## 2016-08-18 DIAGNOSIS — A4101 Sepsis due to Methicillin susceptible Staphylococcus aureus: Secondary | ICD-10-CM | POA: Diagnosis present

## 2016-08-18 DIAGNOSIS — R509 Fever, unspecified: Secondary | ICD-10-CM | POA: Diagnosis present

## 2016-08-18 DIAGNOSIS — J45909 Unspecified asthma, uncomplicated: Secondary | ICD-10-CM | POA: Diagnosis present

## 2016-08-18 DIAGNOSIS — D696 Thrombocytopenia, unspecified: Secondary | ICD-10-CM | POA: Diagnosis present

## 2016-08-18 DIAGNOSIS — I959 Hypotension, unspecified: Secondary | ICD-10-CM | POA: Diagnosis present

## 2016-08-18 DIAGNOSIS — J4540 Moderate persistent asthma, uncomplicated: Secondary | ICD-10-CM | POA: Diagnosis present

## 2016-08-18 DIAGNOSIS — Y712 Prosthetic and other implants, materials and accessory cardiovascular devices associated with adverse incidents: Secondary | ICD-10-CM | POA: Diagnosis not present

## 2016-08-18 DIAGNOSIS — M4647 Discitis, unspecified, lumbosacral region: Secondary | ICD-10-CM | POA: Diagnosis present

## 2016-08-18 DIAGNOSIS — M545 Low back pain: Secondary | ICD-10-CM | POA: Diagnosis not present

## 2016-08-18 DIAGNOSIS — G4733 Obstructive sleep apnea (adult) (pediatric): Secondary | ICD-10-CM | POA: Diagnosis present

## 2016-08-18 DIAGNOSIS — G9341 Metabolic encephalopathy: Secondary | ICD-10-CM | POA: Diagnosis present

## 2016-08-18 DIAGNOSIS — I330 Acute and subacute infective endocarditis: Secondary | ICD-10-CM | POA: Diagnosis present

## 2016-08-18 DIAGNOSIS — R7989 Other specified abnormal findings of blood chemistry: Secondary | ICD-10-CM | POA: Diagnosis not present

## 2016-08-18 DIAGNOSIS — B9561 Methicillin susceptible Staphylococcus aureus infection as the cause of diseases classified elsewhere: Secondary | ICD-10-CM | POA: Diagnosis not present

## 2016-08-18 DIAGNOSIS — I159 Secondary hypertension, unspecified: Secondary | ICD-10-CM | POA: Diagnosis not present

## 2016-08-18 DIAGNOSIS — T826XXA Infection and inflammatory reaction due to cardiac valve prosthesis, initial encounter: Secondary | ICD-10-CM | POA: Diagnosis present

## 2016-08-18 DIAGNOSIS — R7881 Bacteremia: Secondary | ICD-10-CM | POA: Diagnosis not present

## 2016-08-18 DIAGNOSIS — M4646 Discitis, unspecified, lumbar region: Secondary | ICD-10-CM | POA: Diagnosis not present

## 2016-08-18 DIAGNOSIS — R6520 Severe sepsis without septic shock: Secondary | ICD-10-CM | POA: Diagnosis present

## 2016-08-18 DIAGNOSIS — R945 Abnormal results of liver function studies: Secondary | ICD-10-CM

## 2016-08-18 DIAGNOSIS — A419 Sepsis, unspecified organism: Secondary | ICD-10-CM

## 2016-08-18 DIAGNOSIS — Y831 Surgical operation with implant of artificial internal device as the cause of abnormal reaction of the patient, or of later complication, without mention of misadventure at the time of the procedure: Secondary | ICD-10-CM | POA: Diagnosis present

## 2016-08-18 DIAGNOSIS — I340 Nonrheumatic mitral (valve) insufficiency: Secondary | ICD-10-CM | POA: Diagnosis not present

## 2016-08-18 LAB — BLOOD CULTURE ID PANEL (REFLEXED)
Acinetobacter baumannii: NOT DETECTED
CANDIDA ALBICANS: NOT DETECTED
CANDIDA GLABRATA: NOT DETECTED
CANDIDA PARAPSILOSIS: NOT DETECTED
CANDIDA TROPICALIS: NOT DETECTED
Candida krusei: NOT DETECTED
ENTEROBACTER CLOACAE COMPLEX: NOT DETECTED
ENTEROBACTERIACEAE SPECIES: NOT DETECTED
Enterococcus species: NOT DETECTED
Escherichia coli: NOT DETECTED
Haemophilus influenzae: NOT DETECTED
KLEBSIELLA OXYTOCA: NOT DETECTED
KLEBSIELLA PNEUMONIAE: NOT DETECTED
Listeria monocytogenes: NOT DETECTED
Methicillin resistance: NOT DETECTED
Neisseria meningitidis: NOT DETECTED
Proteus species: NOT DETECTED
Pseudomonas aeruginosa: NOT DETECTED
STREPTOCOCCUS PNEUMONIAE: NOT DETECTED
STREPTOCOCCUS PYOGENES: NOT DETECTED
STREPTOCOCCUS SPECIES: NOT DETECTED
Serratia marcescens: NOT DETECTED
Staphylococcus aureus (BCID): DETECTED — AB
Staphylococcus species: DETECTED — AB
Streptococcus agalactiae: NOT DETECTED

## 2016-08-18 LAB — COMPREHENSIVE METABOLIC PANEL
ALBUMIN: 3.5 g/dL (ref 3.5–5.0)
ALK PHOS: 63 U/L (ref 38–126)
ALT: 77 U/L — ABNORMAL HIGH (ref 17–63)
ANION GAP: 7 (ref 5–15)
AST: 100 U/L — ABNORMAL HIGH (ref 15–41)
BUN: 21 mg/dL — ABNORMAL HIGH (ref 6–20)
CALCIUM: 7.9 mg/dL — AB (ref 8.9–10.3)
CHLORIDE: 106 mmol/L (ref 101–111)
CO2: 23 mmol/L (ref 22–32)
Creatinine, Ser: 1.09 mg/dL (ref 0.61–1.24)
GFR calc Af Amer: >60 mL/min (ref >60)
GFR calc non Af Amer: >60 mL/min (ref >60)
GLUCOSE: 111 mg/dL — AB (ref 65–99)
POTASSIUM: 3.4 mmol/L — AB (ref 3.5–5.1)
SODIUM: 136 mmol/L (ref 135–145)
Total Bilirubin: 1.8 mg/dL — ABNORMAL HIGH (ref 0.3–1.2)
Total Protein: 6.4 g/dL — ABNORMAL LOW (ref 6.5–8.1)

## 2016-08-18 LAB — URINALYSIS, ROUTINE W REFLEX MICROSCOPIC
BACTERIA UA: NONE SEEN
BILIRUBIN URINE: NEGATIVE
Glucose, UA: NEGATIVE mg/dL
KETONES UR: NEGATIVE mg/dL
Leukocytes, UA: NEGATIVE
Nitrite: NEGATIVE
PH: 5 (ref 5.0–8.0)
PROTEIN: 30 mg/dL — AB
Specific Gravity, Urine: 1.019 (ref 1.005–1.030)
Squamous Epithelial / HPF: NONE SEEN

## 2016-08-18 LAB — PROCALCITONIN: Procalcitonin: 1.64 ng/mL

## 2016-08-18 LAB — CBC
HEMATOCRIT: 36.1 % — AB (ref 39.0–52.0)
HEMOGLOBIN: 12 g/dL — AB (ref 13.0–17.0)
MCH: 28.6 pg (ref 26.0–34.0)
MCHC: 33.2 g/dL (ref 30.0–36.0)
MCV: 86 fL (ref 78.0–100.0)
Platelets: 142 10*3/uL — ABNORMAL LOW (ref 150–400)
RBC: 4.2 MIL/uL — ABNORMAL LOW (ref 4.22–5.81)
RDW: 14 % (ref 11.5–15.5)
WBC: 11.3 10*3/uL — ABNORMAL HIGH (ref 4.0–10.5)

## 2016-08-18 LAB — INFLUENZA PANEL BY PCR (TYPE A & B)
INFLAPCR: NEGATIVE
INFLBPCR: NEGATIVE

## 2016-08-18 LAB — I-STAT CG4 LACTIC ACID, ED: LACTIC ACID, VENOUS: 1.7 mmol/L (ref 0.5–1.9)

## 2016-08-18 LAB — MRSA PCR SCREENING: MRSA BY PCR: NEGATIVE

## 2016-08-18 LAB — PROTIME-INR
INR: 2.34
Prothrombin Time: 26 seconds — ABNORMAL HIGH (ref 11.4–15.2)

## 2016-08-18 MED ORDER — ONDANSETRON HCL 4 MG/2ML IJ SOLN
4.0000 mg | Freq: Four times a day (QID) | INTRAMUSCULAR | Status: DC | PRN
  Administered 2016-08-18: 4 mg via INTRAVENOUS
  Filled 2016-08-18: qty 2

## 2016-08-18 MED ORDER — CEFAZOLIN SODIUM-DEXTROSE 2-4 GM/100ML-% IV SOLN
2.0000 g | Freq: Three times a day (TID) | INTRAVENOUS | Status: DC
  Administered 2016-08-18 – 2016-08-24 (×18): 2 g via INTRAVENOUS
  Filled 2016-08-18 (×22): qty 100

## 2016-08-18 MED ORDER — SODIUM CHLORIDE 0.9 % IV SOLN
INTRAVENOUS | Status: DC
  Administered 2016-08-18 – 2016-08-19 (×4): via INTRAVENOUS
  Filled 2016-08-18 (×8): qty 1000

## 2016-08-18 MED ORDER — SALINE SPRAY 0.65 % NA SOLN
1.0000 | NASAL | Status: DC | PRN
  Filled 2016-08-18: qty 44

## 2016-08-18 MED ORDER — SODIUM CHLORIDE 0.9 % IV BOLUS (SEPSIS)
2000.0000 mL | Freq: Once | INTRAVENOUS | Status: AC
  Administered 2016-08-18: 2000 mL via INTRAVENOUS

## 2016-08-18 MED ORDER — WARFARIN SODIUM 4 MG PO TABS
8.0000 mg | ORAL_TABLET | ORAL | Status: AC
  Administered 2016-08-18: 8 mg via ORAL
  Filled 2016-08-18: qty 2

## 2016-08-18 MED ORDER — ACETAMINOPHEN 325 MG PO TABS
650.0000 mg | ORAL_TABLET | Freq: Four times a day (QID) | ORAL | Status: DC | PRN
  Administered 2016-08-18 – 2016-08-26 (×17): 650 mg via ORAL
  Filled 2016-08-18 (×19): qty 2

## 2016-08-18 MED ORDER — PIPERACILLIN-TAZOBACTAM 3.375 G IVPB
3.3750 g | Freq: Three times a day (TID) | INTRAVENOUS | Status: DC
  Administered 2016-08-18: 3.375 g via INTRAVENOUS
  Filled 2016-08-18: qty 50

## 2016-08-18 MED ORDER — HYDROCODONE-ACETAMINOPHEN 5-325 MG PO TABS
1.0000 | ORAL_TABLET | Freq: Four times a day (QID) | ORAL | Status: DC | PRN
  Administered 2016-08-18 – 2016-08-19 (×4): 1 via ORAL
  Filled 2016-08-18 (×4): qty 1

## 2016-08-18 MED ORDER — ZOLPIDEM TARTRATE 10 MG PO TABS
10.0000 mg | ORAL_TABLET | Freq: Every day | ORAL | Status: DC
  Administered 2016-08-18 – 2016-08-25 (×8): 10 mg via ORAL
  Filled 2016-08-18 (×8): qty 1

## 2016-08-18 MED ORDER — LIP MEDEX EX OINT
TOPICAL_OINTMENT | CUTANEOUS | Status: AC
  Administered 2016-08-18: 1
  Filled 2016-08-18: qty 7

## 2016-08-18 MED ORDER — ONDANSETRON HCL 4 MG PO TABS: 4.0000 mg | ORAL_TABLET | Freq: Four times a day (QID) | ORAL | Status: DC | PRN

## 2016-08-18 MED ORDER — MOMETASONE FURO-FORMOTEROL FUM 200-5 MCG/ACT IN AERO
2.0000 | INHALATION_SPRAY | Freq: Two times a day (BID) | RESPIRATORY_TRACT | Status: DC
  Administered 2016-08-18 – 2016-08-26 (×16): 2 via RESPIRATORY_TRACT
  Filled 2016-08-18 (×2): qty 8.8

## 2016-08-18 MED ORDER — ENOXAPARIN SODIUM 40 MG/0.4ML ~~LOC~~ SOLN: 40.0000 mg | SUBCUTANEOUS | Status: DC

## 2016-08-18 MED ORDER — ALBUTEROL SULFATE (2.5 MG/3ML) 0.083% IN NEBU: 2.5000 mg | INHALATION_SOLUTION | RESPIRATORY_TRACT | Status: DC | PRN

## 2016-08-18 MED ORDER — POTASSIUM CHLORIDE IN NACL 20-0.9 MEQ/L-% IV SOLN: INTRAVENOUS | Status: DC

## 2016-08-18 MED ORDER — PANTOPRAZOLE SODIUM 40 MG PO TBEC
40.0000 mg | DELAYED_RELEASE_TABLET | Freq: Every day | ORAL | Status: DC
  Administered 2016-08-18 – 2016-08-26 (×8): 40 mg via ORAL
  Filled 2016-08-18 (×8): qty 1

## 2016-08-18 MED ORDER — VANCOMYCIN HCL IN DEXTROSE 1-5 GM/200ML-% IV SOLN
1000.0000 mg | Freq: Three times a day (TID) | INTRAVENOUS | Status: DC
  Administered 2016-08-18: 1000 mg via INTRAVENOUS
  Filled 2016-08-18 (×2): qty 200

## 2016-08-18 MED ORDER — WARFARIN - PHARMACIST DOSING INPATIENT: Freq: Every day | Status: DC

## 2016-08-18 MED ORDER — ACETAMINOPHEN 650 MG RE SUPP: 650.0000 mg | Freq: Four times a day (QID) | RECTAL | Status: DC | PRN

## 2016-08-18 MED ORDER — OSELTAMIVIR PHOSPHATE 75 MG PO CAPS
75.0000 mg | ORAL_CAPSULE | Freq: Two times a day (BID) | ORAL | Status: DC
  Administered 2016-08-18 (×3): 75 mg via ORAL
  Filled 2016-08-18 (×4): qty 1

## 2016-08-18 MED ORDER — SODIUM CHLORIDE 0.9 % IV BOLUS (SEPSIS)
1000.0000 mL | Freq: Once | INTRAVENOUS | Status: AC
  Administered 2016-08-18: 1000 mL via INTRAVENOUS

## 2016-08-18 NOTE — Progress Notes (Signed)
Per infection prevention, kara cannon, since patient is still showing flu-like symptoms and remains on PO tamiflu, patient should remain on droplet until alternate diagnosis is found.

## 2016-08-18 NOTE — H&P (Signed)
History and Physical  Patient Name: Jeffery Whitehead     [id number]    DOB: 1955-10-26    DOA: 08/17/2016 PCP: Charolette Forward, PA-C   Patient coming from: Home  Chief Complaint: Confusion, influenza-like illness  HPI: Jeffery Whitehead is a 61 y.o. 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  The patient was in his usual state of health until a few days ago when he developed URI symptoms of sinus congestion, fever.  Over the last two days this has progressed to generalized malaise, body aches, fever, chills, sore throat and headache.  Today, his wife called home in the evening to check on him and he seemed confused and slurred to her so she called 9-1-1.  EMS gave 1500 IVF en route and found him with temp 104F.  There has been no cough, sputum production, neck stiffness.  There has been no dysuria, urinary symptoms.  On arrival, he was somewhat altered, moderate hypotension.  ED course: -Temp 99.3F, heart rate 79, respirations 24, blood pressure 88/54, pulse oximetry normal -Na 135, K 2.9, Cr 1.00, WBC 12.4K, Hgb 11.5 normocytic -INR 2.6 -Lactate 1.08 -CT of the head NAICP -CXR without focal opacity or pneumonia -The case was discussed with Neurology who suspected a metabolic encephalopathy from influenza or sepsis, not stroke likely -He was given potassium, Tamiflu, mag, and empiric antibiotics and TRH were asked to evaluate for possible sepsis        ROS: Review of Systems  Constitutional: Positive for chills, diaphoresis, fever and malaise/fatigue.  HENT: Positive for congestion and sore throat. Negative for ear pain.   Respiratory: Negative for cough, sputum production, shortness of breath and wheezing.   Cardiovascular: Negative for chest pain.  Gastrointestinal: Positive for nausea. Negative for abdominal pain and vomiting.  Genitourinary: Negative  for dysuria, frequency and urgency.  Musculoskeletal: Positive for myalgias. Negative for neck pain.  Neurological: Positive for weakness and headaches. Negative for sensory change, speech change, focal weakness, seizures and loss of consciousness.  All other systems reviewed and are negative.         Past Medical History:  Diagnosis Date  . Cold   . Decreased anal sphincter tone   . Heart valve replaced    1983   st jude  . Insomnia   . Shortness of breath dyspnea   . Sleep apnea    cpap  . Varicose vein of leg   . Varicose veins     Past Surgical History:  Procedure Laterality Date  . AORTIC VALVE REPLACEMENT  1983  . TONSILLECTOMY    . VEIN LIGATION AND STRIPPING Left 08/15/2014   Procedure: SAPHENOUS VEIN LIGATION AND EXCISION OF VARICOSITIES;  Surgeon: Mal Misty, MD;  Location: [facility];  Service: Vascular;  Laterality: Left;    Social History: He  reports that he has never smoked. He has never used smokeless tobacco. He reports that he drinks alcohol. He reports that he does not use drugs.  No Known Allergies  Family history: family history includes Heart disease in his father.  Prior to Admission medications   Medication Sig Start Date End Date Taking? Authorizing Provider  budesonide-formoterol (SYMBICORT) 160-4.5 MCG/ACT inhaler Inhale 2 puffs into the lungs 2 times daily 06/10/16  Yes [name], MD  fluticasone (FLONASE) 50 MCG/ACT nasal spray Place 1 spray into both nostrils as needed  for allergies or rhinitis (as needed for allergy symptoms).   Yes Historical Provider, MD  lisinopril (PRINIVIL,ZESTRIL) 20 MG tablet Take 20 mg by mouth daily.   Yes Historical Provider, MD  loratadine (CLARITIN) 10 MG tablet Take 10 mg by mouth daily as needed for allergies.    Yes Historical Provider, MD  Multiple Vitamins-Minerals (CENTRUM SILVER ADULT 50+ PO) Take 1 tablet by mouth daily with breakfast.   Yes Historical Provider, MD  omeprazole (PRILOSEC) 20 MG capsule  Take 1 capsule by mouth daily. 07/16/16  Yes Historical Provider, MD  SF 5000 PLUS 1.1 % CREA dental cream Place 1 application onto teeth at bedtime as needed (dental hygiene).  06/03/14  Yes Historical Provider, MD  warfarin (COUMADIN) 1 MG tablet Take 0.5 mg by mouth daily. On wednesdays 04/14/16  Yes Historical Provider, MD  warfarin (COUMADIN) 7.5 MG tablet Take 7.5 mg by mouth as directed. Take 7.5 MG on Sunday, Monday, Tuesday, Thursday, Friday, Saturday. Take 8 MG on Wednesday 02/24/16  Yes Historical Provider, MD  zolpidem (AMBIEN) 10 MG tablet Take 10 mg by mouth at bedtime.  05/14/14  Yes Historical Provider, MD  albuterol (PROVENTIL HFA;VENTOLIN HFA) 108 (90 Base) MCG/ACT inhaler Inhale 2 puffs into the lungs every 6 (six) hours as needed for wheezing or shortness of breath. 09/08/15   [name], MD       Physical Exam: BP 109/98   Pulse 97   Temp 99.3 F (37.4 C) (Oral)   Resp 18   Ht 6' (1.829 m)   Wt 115.7 kg (255 lb)   SpO2 95%   BMI 34.58 kg/m  General appearance: Well-developed, adult male, awake, oriented but in moderate distress from malaise, fever.   Eyes: Icteric, conjunctiva pink, lids and lashes normal. PERRL.    ENT: No nasal deformity, discharge, epistaxis.  Hearing normal. OP very dry without lesions.   Neck: No neck masses.  Trachea midline.  No thyromegaly/tenderness. Lymph: No cervical or supraclavicular lymphadenopathy. Skin: Warm and dry.  Very mild jaundice.  No suspicious rashes or lesions. Cardiac: Tachycardic, nl S1-S2, no murmurs appreciated.  Capillary refill is brisk.  JVP normale.  No LE edema.  Radial and DP pulses 2+ and symmetric. Respiratory: Tachypneic, no rales on exam, expiratory wheezes present. Abdomen: Abdomen soft.  No TTP, no RUQ pain. No ascites, distension, hepatosplenomegaly.   MSK: No deformities or effusions.  No cyanosis or clubbing. Neuro: Cranial nerves grossly normal and symmetric.  Sensation intact to light touch. Speech is  fluent.  Muscle strength 4/5 and globally very weak, but symmetric.    Psych: Sensorium intact and responding to questions, attention diminished by fever.  Oriented to [employer], situation, year.  Behavior appears tired.  Affect blunted.  Judgment and insight appear normal.     Labs on Admission:  I have personally reviewed following labs and imaging studies: CBC:  Recent Labs Lab 08/17/16 2104  WBC 12.4*  NEUTROABS 11.9*  HGB 11.5*  HCT 34.8*  MCV 84.1  PLT [id number]*   Basic Metabolic Panel:  Recent Labs Lab 08/17/16 2104  NA 135  K 2.9*  CL 106  CO2 22  GLUCOSE 135*  BUN 19  CREATININE 1.00  CALCIUM 7.8*   GFR: Estimated Creatinine Clearance: 101.8 mL/min (by C-G formula based on SCr of 1 mg/dL).  Liver Function Tests:  Recent Labs Lab 08/17/16 2104  AST 72*  ALT 59  ALKPHOS 62  BILITOT 1.6*  PROT 6.4*  ALBUMIN 3.8  No results for input(s): LIPASE, AMYLASE in the last 168 hours. No results for input(s): AMMONIA in the last 168 hours. Coagulation Profile:  Recent Labs Lab 08/17/16 2104  INR 2.67   Cardiac Enzymes: No results for input(s): CKTOTAL, CKMB, CKMBINDEX, TROPONINI in the last 168 hours. BNP (last 3 results) No results for input(s): PROBNP in the last 8760 hours. HbA1C: No results for input(s): HGBA1C in the last 72 hours. CBG: No results for input(s): GLUCAP in the last 168 hours. Lipid Profile: No results for input(s): CHOL, HDL, LDLCALC, TRIG, CHOLHDL, LDLDIRECT in the last 72 hours. Thyroid Function Tests: No results for input(s): TSH, T4TOTAL, FREET4, T3FREE, THYROIDAB in the last 72 hours. Anemia Panel: No results for input(s): VITAMINB12, FOLATE, FERRITIN, TIBC, IRON, RETICCTPCT in the last 72 hours. Sepsis Labs: Lactic acid 1.08 Invalid input(s): PROCALCITONIN, LACTICIDVEN No results found for this or any previous visit (from the past 240 hour(s)).       Radiological Exams on Admission: Personally reviewed CXR shows no  pneumonia, CT head report reviewed: Dg Chest 2 View  Result Date: 08/17/2016 CLINICAL DATA:  Body aches and fever EXAM: CHEST  2 VIEW COMPARISON:  None. FINDINGS: Cardiomediastinal silhouette is upper limits normal. No focal airspace consolidation or pulmonary edema. No pneumothorax or pleural effusion. IMPRESSION: No active cardiopulmonary disease. Electronically Signed   By: Ulyses Jarred M.D.   On: 08/17/2016 22:24   Ct Head Wo Contrast  Result Date: 08/17/2016 CLINICAL DATA:  Altered mental status and dizziness EXAM: CT HEAD WITHOUT CONTRAST TECHNIQUE: Contiguous axial images were obtained from the base of the skull through the vertex without intravenous contrast. COMPARISON:  None. FINDINGS: Brain: No mass lesion, intraparenchymal hemorrhage or extra-axial collection. No evidence of acute cortical infarct. Brain parenchyma and CSF-containing spaces are normal for age. Vascular: No hyperdense vessel or unexpected calcification. Skull: Normal visualized skull base, calvarium and extracranial soft tissues. Sinuses/Orbits: No sinus fluid levels or advanced mucosal thickening. No mastoid effusion. Normal orbits. IMPRESSION: Normal CT of the brain. Electronically Signed   By: Ulyses Jarred M.D.   On: 08/17/2016 22:03    Echocardiogram 2015: EF 55-60% Mild LVH Mech AV      Assessment/Plan  1. Sepsis, unclear source:  Suspected source unclear, influenza suspected.  Ddx still includes prosthetic valve endocarditis, UTI, cholangitis. Doubt meningitis.   Patient meets criteria given tachypnea, fever, leukocytosis, and evidence of organ dysfunction (AMS, liver, anemia).  Lactate normal.  This patient is at high risk of poor outcomes with a qSOFA score of 3 (at least 2 of the following clinical criteria: respiratory rate of 22/min or greater, altered mentation, or systolic blood pressure of 100 mm Hg or less).  Antibiotics delivered in the ED.    -Sepsis bundle utilized:  -Blood cultures drawn,  urine culture ordered  -Flu PCR pending  -Will give additional bolus now to reach 30 ml/kg bolus  -Antibiotics: Vancomycin and Zosyn and Tamiflu  -Repeat renal function and complete blood count in AM  -Code SEPSIS called to E-link  -Check procalcitonin       2. Encephalopathy:  This is nonfocal, suspect metabolic encephalopathy from influenza or bacterial sepsis, until ruled out.  No exam findings to suggest meningitis.  3. Hypokalemia:  Moderate. Oral K given in the ER. -Check mag -IVF with K -Check BMP tomorrow  4. Elevated LFTs:  Possibly influenza related. -Trend CMP, and image abdomen if rising  5. OSA on CPAP:  -Continue home CPAP  6. St. Jude  aVR:  -Continue warfarin, dosed per pharmacy  7. Anemia:  This is new. No evidence of bleeding. -Trend hemoglobin  8. Asthma: Scant wheezing with exam. -Continue Symbicort -Albuterol when necessary  9. Hypertension: Hypotensive at admission -Hold lisinopril until hemodynamics clearer  9. Other medications: -Continue PPI, hold Ambein until more alert         DVT prophylaxis: Warfarin  Code Status: FULL  Family Communication: None present  Disposition Plan: Anticipate IV fluids, follow closely overnight.  If flu+, may be much better tomorrow, can stop antibiotics and prepare for discharge soon, but if flu-, may need to continue antibiotics until source clearer and he has stabilized more Consults called: None Admission status: OBS At the point of initial evaluation, it is my clinical opinion that admission for OBSERVATION is reasonable and necessary because the patient's presenting complaints in the context of their chronic conditions represent sufficient risk of deterioration or significant morbidity to constitute reasonable grounds for close observation in the hospital setting, but that the patient may be medically stable for discharge from the hospital within 24 to 48 hours.    Medical decision making:  Patient seen at 12:35 AM on 08/18/2016.  The patient was discussed with Dr. Kathrynn Humble.  What exists of the patient's chart was reviewed in depth and summarized above.  Clinical condition: soft BP and somewhat confused, will monitor tonight in stepdown.        Edwin Dada [facility] Pager [phone]

## 2016-08-18 NOTE — Progress Notes (Signed)
Called by radiology today. Patient is scheduled to have a right abdominal ultrasound however patient has not been NPO. Patient made aware of the need for an ultrasound and has agreed to be NPO. Last PO intake was at 0800. Patient will remain NPO until ultrasound at 1400.

## 2016-08-18 NOTE — Progress Notes (Signed)
ANTICOAGULATION CONSULT NOTE - Initial Consult  Pharmacy Consult for warfarin Indication: Mechanical Valve St. Jude  No Known Allergies  Patient Measurements: Height: 6' (182.9 cm) Weight: 255 lb (115.7 kg) IBW/kg (Calculated) : 77.6 Heparin Dosing Weight:   Vital Signs: Temp: 101.3 F (38.5 C) (02/15 0400) Temp Source: Axillary (02/15 0400) BP: 109/98 (02/15 0100) Pulse Rate: 97 (02/15 0031)  Labs:  Recent Labs  08/17/16 2104 08/18/16 0351  HGB 11.5* 12.0*  HCT 34.8* 36.1*  PLT 143* 142*  LABPROT 29.0* 26.0*  INR 2.67 2.34  CREATININE 1.00 1.09    Estimated Creatinine Clearance: 93.4 mL/min (by C-G formula based on SCr of 1.09 mg/dL).   Medical History: Past Medical History:  Diagnosis Date  . Cold   . Decreased anal sphincter tone   . Heart valve replaced    1983   st jude  . Insomnia   . Shortness of breath dyspnea   . Sleep apnea    cpap  . Varicose vein of leg   . Varicose veins     Medications:  Prescriptions Prior to Admission  Medication Sig Dispense Refill Last Dose  . budesonide-formoterol (SYMBICORT) 160-4.5 MCG/ACT inhaler Inhale 2 puffs into the lungs 2 times daily 1 Inhaler 6 08/16/2016 at Unknown time  . fluticasone (FLONASE) 50 MCG/ACT nasal spray Place 1 spray into both nostrils as needed for allergies or rhinitis (as needed for allergy symptoms).   08/16/2016 at Unknown time  . lisinopril (PRINIVIL,ZESTRIL) 20 MG tablet Take 20 mg by mouth daily.   08/16/2016 at Unknown time  . loratadine (CLARITIN) 10 MG tablet Take 10 mg by mouth daily as needed for allergies.    Past Week at Unknown time  . Multiple Vitamins-Minerals (CENTRUM SILVER ADULT 50+ PO) Take 1 tablet by mouth daily with breakfast.   08/16/2016 at Unknown time  . omeprazole (PRILOSEC) 20 MG capsule Take 1 capsule by mouth daily.   08/16/2016 at Unknown time  . SF 5000 PLUS 1.1 % CREA dental cream Place 1 application onto teeth at bedtime as needed (dental hygiene).    08/17/2016 at  Unknown time  . warfarin (COUMADIN) 1 MG tablet Take 0.5 mg by mouth daily. On wednesdays   08/16/2016 at 2000  . warfarin (COUMADIN) 7.5 MG tablet Take 7.5 mg by mouth as directed. Take 7.5 MG on Sunday, Monday, Tuesday, Thursday, Friday, Saturday. Take 8 MG on Wednesday   08/16/2016 at 2000  . zolpidem (AMBIEN) 10 MG tablet Take 10 mg by mouth at bedtime.    08/16/2016 at Unknown time  . albuterol (PROVENTIL HFA;VENTOLIN HFA) 108 (90 Base) MCG/ACT inhaler Inhale 2 puffs into the lungs every 6 (six) hours as needed for wheezing or shortness of breath. 1 Inhaler 5 not sued   Scheduled:  . enoxaparin (LOVENOX) injection  40 mg Subcutaneous Q24H  . mometasone-formoterol  2 puff Inhalation BID  . oseltamivir  75 mg Oral BID  . pantoprazole  40 mg Oral Daily  . piperacillin-tazobactam (ZOSYN)  IV  3.375 g Intravenous Q8H  . vancomycin  1,000 mg Intravenous Q8H  . warfarin  8 mg Oral NOW  . Warfarin - Pharmacist Dosing Inpatient   Does not apply q1800    Assessment: Patient wth St. Jude valve with goal INR 2.5-3.5 per prior anti-coag clinic notes.  INR this am < 2.5.  Last dose warfarin noted 2-13 at 2000.  Goal of Therapy:  INR 2.5-3.5    Plan:  Warfarin 8mg  po x1 now  Daily INR  Nani Skillern Crowford 08/18/2016,5:41 AM

## 2016-08-18 NOTE — ED Notes (Signed)
Hospitalist at bedside 

## 2016-08-18 NOTE — Progress Notes (Signed)
PHARMACY - PHYSICIAN COMMUNICATION CRITICAL VALUE ALERT - BLOOD CULTURE IDENTIFICATION (BCID)  Results for orders placed or performed during the hospital encounter of 08/17/16  Blood Culture ID Panel (Reflexed) (Collected: 08/17/2016  9:05 PM)  Result Value Ref Range   Enterococcus species NOT DETECTED NOT DETECTED   Listeria monocytogenes NOT DETECTED NOT DETECTED   Staphylococcus species DETECTED (A) NOT DETECTED   Staphylococcus aureus DETECTED (A) NOT DETECTED   Methicillin resistance NOT DETECTED NOT DETECTED   Streptococcus species NOT DETECTED NOT DETECTED   Streptococcus agalactiae NOT DETECTED NOT DETECTED   Streptococcus pneumoniae NOT DETECTED NOT DETECTED   Streptococcus pyogenes NOT DETECTED NOT DETECTED   Acinetobacter baumannii NOT DETECTED NOT DETECTED   Enterobacteriaceae species NOT DETECTED NOT DETECTED   Enterobacter cloacae complex NOT DETECTED NOT DETECTED   Escherichia coli NOT DETECTED NOT DETECTED   Klebsiella oxytoca NOT DETECTED NOT DETECTED   Klebsiella pneumoniae NOT DETECTED NOT DETECTED   Proteus species NOT DETECTED NOT DETECTED   Serratia marcescens NOT DETECTED NOT DETECTED   Haemophilus influenzae NOT DETECTED NOT DETECTED   Neisseria meningitidis NOT DETECTED NOT DETECTED   Pseudomonas aeruginosa NOT DETECTED NOT DETECTED   Candida albicans NOT DETECTED NOT DETECTED   Candida glabrata NOT DETECTED NOT DETECTED   Candida krusei NOT DETECTED NOT DETECTED   Candida parapsilosis NOT DETECTED NOT DETECTED   Candida tropicalis NOT DETECTED NOT DETECTED    Name of physician (or Provider) Contacted: Elmahi  Changes to prescribed antibiotics required: DC vancomycin & zosyn, begin cefazolin 2g IV q8h  Peggyann Juba, PharmD, BCPS Pager: [phone] 08/18/2016  1:11 PM

## 2016-08-18 NOTE — Progress Notes (Signed)
Pharmacy Antibiotic Note  Jeffery Whitehead is a 61 y.o. male admitted on 08/17/2016 with sepsis.  Pharmacy has been consulted for Vancomycin and Zosyn dosing.  Plan: Vancomycin 1gm IV every 8 hours.  Goal trough 15-20 mcg/mL. Zosyn 3.375g IV q8h (4 hour infusion).  Height: 6' (182.9 cm) Weight: 255 lb (115.7 kg) IBW/kg (Calculated) : 77.6  Temp (24hrs), Avg:100.3 F (37.9 C), Min:99.3 F (37.4 C), Max:101.3 F (38.5 C)   Recent Labs Lab 08/17/16 2104 08/17/16 2117 08/18/16 0101 08/18/16 0351  WBC 12.4*  --   --  11.3*  CREATININE 1.00  --   --  1.09  LATICACIDVEN  --  1.08 1.70  --     Estimated Creatinine Clearance: 93.4 mL/min (by C-G formula based on SCr of 1.09 mg/dL).    No Known Allergies  Antimicrobials this admission: Vancomycin 08/18/2016 >> Zosyn 08/18/2016 >>   Dose adjustments this admission: -  Microbiology results: pending  Thank you for allowing pharmacy to be a part of this patient's care.  Nani Skillern Crowford 08/18/2016 5:41 AM

## 2016-08-18 NOTE — Progress Notes (Signed)
PROGRESS NOTE  Jeffery Whitehead  [id number] DOB: 1956-05-23 DOA: 08/17/2016 PCP: Charolette Forward, PA-C Outpatient Specialists:  Subjective: Denies any complaints this morning, some pressure in his sinuses are than that doing okay. He thought about transferring him to MedSurg, his blood pressure is in the soft side, give IV fluids and monitor.  Brief Narrative:  Jeffery Whitehead is a 61 y.o. 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 The patient was in his usual state of health until a few days ago when he developed URI symptoms of sinus congestion, fever.  Over the last two days this has progressed to generalized malaise, body aches, fever, chills, sore throat and headache.  Today, his wife called home in the evening to check on him and he seemed confused and slurred to her so she called 9-1-1.  EMS gave 1500 IVF en route and found him with temp 104F.  There has been no cough, sputum production, neck stiffness.  There has been no dysuria, urinary symptoms.  On arrival, he was somewhat altered, moderate hypotension.  Assessment & Plan:   Principal Problem:   Influenza-like illness Active Problems:   OSA (obstructive sleep apnea)   S/P AVR (aortic valve replacement)   Hypokalemia   Elevated LFTs   Asthma, chronic, moderate persistent, uncomplicated   Sepsis ([place])   Normocytic anemia   This is a no charge note, patient seen earlier today by my colleague Dr. Loleta Books. Presented with sepsis symptoms unclear source. Flulike illnesses started on Tamiflu, negative PCR, continue Tamiflu. Continue broad-spectrum antibiotics, follow cultures.  1. Sepsis, unclear source:  Suspected source unclear, influenza suspected.  Ddx still includes prosthetic valve endocarditis, UTI, cholangitis. Doubt meningitis.   Patient meets criteria given tachypnea, fever, leukocytosis,  and evidence of organ dysfunction (AMS, liver, anemia).  Lactate normal.  This patient is at high risk of poor outcomes with a qSOFA score of 3 (at least 2 of the following clinical criteria: respiratory rate of 22/min or greater, altered mentation, or systolic blood pressure of 100 mm Hg or less).  Antibiotics delivered in the ED.    -Sepsis bundle utilized:             -Blood cultures drawn, urine culture ordered             -Flu PCR pending             -Will give additional bolus now to reach 30 ml/kg bolus             -Antibiotics: Vancomycin and Zosyn and Tamiflu             -Repeat renal function and complete blood count in AM             -Code SEPSIS called to E-link             -Check procalcitonin  2. Encephalopathy:  This is nonfocal, suspect metabolic encephalopathy from influenza or bacterial sepsis, until ruled out.  No exam findings to suggest meningitis.  3. Hypokalemia:  Moderate. Oral K given in the ER. -Check mag -IVF with K -Check BMP tomorrow  4. Elevated LFTs:  Possibly influenza related. -Trend CMP, and image abdomen if rising  5. OSA on CPAP:  -Continue home CPAP  6. St. Jude aVR:  -Continue warfarin, dosed per pharmacy  7. Anemia:  This is new. No evidence of  bleeding. -Trend hemoglobin  8. Asthma: Scant wheezing with exam. -Continue Symbicort -Albuterol when necessary  9. Hypertension: Hypotensive at admission -Hold lisinopril until hemodynamics clearer  9. Other medications: -Continue PPI, hold Ambein until more alert   DVT prophylaxis: On full anticoagulation with warfarin.  Code Status: Full Code Family Communication:  Disposition Plan:  Diet: Diet regular Room service appropriate? Yes; Fluid consistency: Thin  Consultants:   None  Procedures:   None  Antimicrobials:   None   Objective: Vitals:   08/18/16 0900 08/18/16 0901 08/18/16 1007 08/18/16 1147  BP: 137/69  (!) 94/54 94/64  Pulse: 84  84 74  Resp: (!)  24  14 (!) 26  Temp:      TempSrc:      SpO2: 100% 99% 96% 97%  Weight:      Height:        Intake/Output Summary (Last 24 hours) at 08/18/16 1203 Last data filed at 08/18/16 0900  Gross per 24 hour  Intake          1633.33 ml  Output                0 ml  Net          1633.33 ml   Filed Weights   08/17/16 2048  Weight: 115.7 kg (255 lb)    Examination: General exam: Appears calm and comfortable  Respiratory system: Clear to auscultation. Respiratory effort normal. Cardiovascular system: S1 & S2 heard, RRR. No JVD, murmurs, rubs, gallops or clicks. No pedal edema. Gastrointestinal system: Abdomen is nondistended, soft and nontender. No organomegaly or masses felt. Normal bowel sounds heard. Central nervous system: Alert and oriented. No focal neurological deficits. Extremities: Symmetric 5 x 5 power. Skin: No rashes, lesions or ulcers Psychiatry: Judgement and insight appear normal. Mood & affect appropriate.   Data Reviewed: I have personally reviewed following labs and imaging studies  CBC:  Recent Labs Lab 08/17/16 2104 08/18/16 0351  WBC 12.4* 11.3*  NEUTROABS 11.9*  --   HGB 11.5* 12.0*  HCT 34.8* 36.1*  MCV 84.1 86.0  PLT 143* [id number]*   Basic Metabolic Panel:  Recent Labs Lab 08/17/16 2104 08/18/16 0351  NA 135 136  K 2.9* 3.4*  CL 106 106  CO2 22 23  GLUCOSE 135* 111*  BUN 19 21*  CREATININE 1.00 1.09  CALCIUM 7.8* 7.9*   GFR: Estimated Creatinine Clearance: 93.4 mL/min (by C-G formula based on SCr of 1.09 mg/dL). Liver Function Tests:  Recent Labs Lab 08/17/16 2104 08/18/16 0351  AST 72* 100*  ALT 59 77*  ALKPHOS 62 63  BILITOT 1.6* 1.8*  PROT 6.4* 6.4*  ALBUMIN 3.8 3.5   No results for input(s): LIPASE, AMYLASE in the last 168 hours. No results for input(s): AMMONIA in the last 168 hours. Coagulation Profile:  Recent Labs Lab 08/17/16 2104 08/18/16 0351  INR 2.67 2.34   Cardiac Enzymes: No results for input(s): CKTOTAL, CKMB,  CKMBINDEX, TROPONINI in the last 168 hours. BNP (last 3 results) No results for input(s): PROBNP in the last 8760 hours. HbA1C: No results for input(s): HGBA1C in the last 72 hours. CBG: No results for input(s): GLUCAP in the last 168 hours. Lipid Profile: No results for input(s): CHOL, HDL, LDLCALC, TRIG, CHOLHDL, LDLDIRECT in the last 72 hours. Thyroid Function Tests: No results for input(s): TSH, T4TOTAL, FREET4, T3FREE, THYROIDAB in the last 72 hours. Anemia Panel: No results for input(s): VITAMINB12, FOLATE, FERRITIN, TIBC, IRON, RETICCTPCT in the last  72 hours. Urine analysis:    Component Value Date/Time   COLORURINE YELLOW 08/18/2016 0044   APPEARANCEUR HAZY (A) 08/18/2016 0044   LABSPEC 1.019 08/18/2016 0044   PHURINE 5.0 08/18/2016 0044   GLUCOSEU NEGATIVE 08/18/2016 0044   HGBUR MODERATE (A) 08/18/2016 0044   BILIRUBINUR NEGATIVE 08/18/2016 0044   KETONESUR NEGATIVE 08/18/2016 0044   PROTEINUR 30 (A) 08/18/2016 0044   NITRITE NEGATIVE 08/18/2016 0044   LEUKOCYTESUR NEGATIVE 08/18/2016 0044   Sepsis Labs: @LABRCNTIP (procalcitonin:4,lacticidven:4)  ) Recent Results (from the past 240 hour(s))  Culture, blood (Routine x 2)     Status: None (Preliminary result)   Collection Time: 08/17/16  9:05 PM  Result Value Ref Range Status   Specimen Description BLOOD LEFT HAND  Final   Special Requests BOTTLES DRAWN AEROBIC AND ANAEROBIC 5 CC  Final   Culture  Setup Time   Final    GRAM POSITIVE COCCI IN CLUSTERS AEROBIC BOTTLE ONLY Organism ID to follow Performed at [facility], [place] [address] [place], [place] [address]    Culture GRAM POSITIVE COCCI  Final   Report Status PENDING  Incomplete  MRSA PCR Screening     Status: None   Collection Time: 08/18/16  2:00 AM  Result Value Ref Range Status   MRSA by PCR NEGATIVE NEGATIVE Final    Comment:        The GeneXpert MRSA Assay (FDA approved for NASAL specimens only), is one component of a comprehensive MRSA  colonization surveillance program. It is not intended to diagnose MRSA infection nor to guide or monitor treatment for MRSA infections.      Invalid input(s): PROCALCITONIN, LACTICACIDVEN   Radiology Studies: Dg Chest 2 View  Result Date: 08/17/2016 CLINICAL DATA:  Body aches and fever EXAM: CHEST  2 VIEW COMPARISON:  None. FINDINGS: Cardiomediastinal silhouette is upper limits normal. No focal airspace consolidation or pulmonary edema. No pneumothorax or pleural effusion. IMPRESSION: No active cardiopulmonary disease. Electronically Signed   By: Ulyses Jarred M.D.   On: 08/17/2016 22:24   Ct Head Wo Contrast  Result Date: 08/17/2016 CLINICAL DATA:  Altered mental status and dizziness EXAM: CT HEAD WITHOUT CONTRAST TECHNIQUE: Contiguous axial images were obtained from the base of the skull through the vertex without intravenous contrast. COMPARISON:  None. FINDINGS: Brain: No mass lesion, intraparenchymal hemorrhage or extra-axial collection. No evidence of acute cortical infarct. Brain parenchyma and CSF-containing spaces are normal for age. Vascular: No hyperdense vessel or unexpected calcification. Skull: Normal visualized skull base, calvarium and extracranial soft tissues. Sinuses/Orbits: No sinus fluid levels or advanced mucosal thickening. No mastoid effusion. Normal orbits. IMPRESSION: Normal CT of the brain. Electronically Signed   By: Ulyses Jarred M.D.   On: 08/17/2016 22:03        Scheduled Meds: . enoxaparin (LOVENOX) injection  40 mg Subcutaneous Q24H  . mometasone-formoterol  2 puff Inhalation BID  . oseltamivir  75 mg Oral BID  . pantoprazole  40 mg Oral Daily  . piperacillin-tazobactam (ZOSYN)  IV  3.375 g Intravenous Q8H  . sodium chloride  1,000 mL Intravenous Once  . vancomycin  1,000 mg Intravenous Q8H  . Warfarin - Pharmacist Dosing Inpatient   Does not apply q1800   Continuous Infusions: . sodium chloride 0.9 % 1,000 mL with potassium chloride 20 mEq  infusion 100 mL/hr at 08/18/16 0334     LOS: 0 days    Time spent: 35 minutes    Anthonee Gelin A, MD [facility] Pager  [phone]  If 7PM-7AM, please contact night-coverage [email or web address] Password [facility] 08/18/2016, 12:03 PM

## 2016-08-18 NOTE — Progress Notes (Signed)
Patient refuses CPAP at this time but said he would wear it tonight.

## 2016-08-19 ENCOUNTER — Inpatient Hospital Stay (HOSPITAL_COMMUNITY): Payer: 59

## 2016-08-19 ENCOUNTER — Encounter (HOSPITAL_COMMUNITY): Payer: Self-pay | Admitting: Internal Medicine

## 2016-08-19 DIAGNOSIS — Z952 Presence of prosthetic heart valve: Secondary | ICD-10-CM

## 2016-08-19 DIAGNOSIS — A4101 Sepsis due to Methicillin susceptible Staphylococcus aureus: Secondary | ICD-10-CM | POA: Diagnosis present

## 2016-08-19 DIAGNOSIS — I10 Essential (primary) hypertension: Secondary | ICD-10-CM | POA: Diagnosis present

## 2016-08-19 DIAGNOSIS — R7881 Bacteremia: Secondary | ICD-10-CM

## 2016-08-19 DIAGNOSIS — G4733 Obstructive sleep apnea (adult) (pediatric): Secondary | ICD-10-CM

## 2016-08-19 DIAGNOSIS — D696 Thrombocytopenia, unspecified: Secondary | ICD-10-CM | POA: Diagnosis present

## 2016-08-19 DIAGNOSIS — Z8249 Family history of ischemic heart disease and other diseases of the circulatory system: Secondary | ICD-10-CM

## 2016-08-19 LAB — ECHOCARDIOGRAM COMPLETE
HEIGHTINCHES: 72 in
WEIGHTICAEL: 4080 [oz_av]

## 2016-08-19 LAB — CBC
HCT: 34.3 % — ABNORMAL LOW (ref 39.0–52.0)
HEMOGLOBIN: 11.2 g/dL — AB (ref 13.0–17.0)
MCH: 28.2 pg (ref 26.0–34.0)
MCHC: 32.7 g/dL (ref 30.0–36.0)
MCV: 86.4 fL (ref 78.0–100.0)
Platelets: 82 10*3/uL — ABNORMAL LOW (ref 150–400)
RBC: 3.97 MIL/uL — AB (ref 4.22–5.81)
RDW: 14.3 % (ref 11.5–15.5)
WBC: 4 10*3/uL (ref 4.0–10.5)

## 2016-08-19 LAB — BASIC METABOLIC PANEL
ANION GAP: 5 (ref 5–15)
BUN: 14 mg/dL (ref 6–20)
CALCIUM: 7.9 mg/dL — AB (ref 8.9–10.3)
CO2: 22 mmol/L (ref 22–32)
Chloride: 109 mmol/L (ref 101–111)
Creatinine, Ser: 0.99 mg/dL (ref 0.61–1.24)
Glucose, Bld: 129 mg/dL — ABNORMAL HIGH (ref 65–99)
Potassium: 3.5 mmol/L (ref 3.5–5.1)
SODIUM: 136 mmol/L (ref 135–145)

## 2016-08-19 LAB — PROTIME-INR
INR: 2.18
PROTHROMBIN TIME: 24.6 s — AB (ref 11.4–15.2)

## 2016-08-19 MED ORDER — WARFARIN SODIUM 4 MG PO TABS
8.0000 mg | ORAL_TABLET | Freq: Once | ORAL | Status: AC
  Administered 2016-08-19: 8 mg via ORAL
  Filled 2016-08-19: qty 2

## 2016-08-19 NOTE — Progress Notes (Signed)
[facility] for warfarin Indication: Mechanical Valve St. Jude  No Known Allergies  Patient Measurements: Height: 6' (182.9 cm) Weight: 255 lb (115.7 kg) IBW/kg (Calculated) : 77.6 Heparin Dosing Weight:   Vital Signs: Temp: 97.9 F (36.6 C) (02/16 0800) Temp Source: Oral (02/16 0800) BP: 82/44 (02/16 0400) Pulse Rate: 63 (02/16 0800)  Labs:  Recent Labs  08/17/16 2104 08/18/16 0351 08/19/16 0403  HGB 11.5* 12.0* 11.2*  HCT 34.8* 36.1* 34.3*  PLT 143* 142* 82*  LABPROT 29.0* 26.0* 24.6*  INR 2.67 2.34 2.18  CREATININE 1.00 1.09 0.99    Estimated Creatinine Clearance: 102.9 mL/min (by C-G formula based on SCr of 0.99 mg/dL).   Medical History: Past Medical History:  Diagnosis Date  . Cold   . Decreased anal sphincter tone   . Heart valve replaced    1983   st jude  . Insomnia   . Shortness of breath dyspnea   . Sleep apnea    cpap  . Varicose vein of leg   . Varicose veins     Medications:  Prescriptions Prior to Admission  Medication Sig Dispense Refill Last Dose  . budesonide-formoterol (SYMBICORT) 160-4.5 MCG/ACT inhaler Inhale 2 puffs into the lungs 2 times daily 1 Inhaler 6 08/16/2016 at Unknown time  . fluticasone (FLONASE) 50 MCG/ACT nasal spray Place 1 spray into both nostrils as needed for allergies or rhinitis (as needed for allergy symptoms).   08/16/2016 at Unknown time  . lisinopril (PRINIVIL,ZESTRIL) 20 MG tablet Take 20 mg by mouth daily.   08/16/2016 at Unknown time  . loratadine (CLARITIN) 10 MG tablet Take 10 mg by mouth daily as needed for allergies.    Past Week at Unknown time  . Multiple Vitamins-Minerals (CENTRUM SILVER ADULT 50+ PO) Take 1 tablet by mouth daily with breakfast.   08/16/2016 at Unknown time  . omeprazole (PRILOSEC) 20 MG capsule Take 1 capsule by mouth daily.   08/16/2016 at Unknown time  . SF 5000 PLUS 1.1 % CREA dental cream Place 1 application onto teeth at bedtime as needed (dental  hygiene).    08/17/2016 at Unknown time  . warfarin (COUMADIN) 1 MG tablet Take 0.5 mg by mouth daily. On wednesdays   08/16/2016 at 2000  . warfarin (COUMADIN) 7.5 MG tablet Take 7.5 mg by mouth as directed. Take 7.5 MG on Sunday, Monday, Tuesday, Thursday, Friday, Saturday. Take 8 MG on Wednesday   08/16/2016 at 2000  . zolpidem (AMBIEN) 10 MG tablet Take 10 mg by mouth at bedtime.    08/16/2016 at Unknown time  . albuterol (PROVENTIL HFA;VENTOLIN HFA) 108 (90 Base) MCG/ACT inhaler Inhale 2 puffs into the lungs every 6 (six) hours as needed for wheezing or shortness of breath. 1 Inhaler 5 not sued   Scheduled:  .  ceFAZolin (ANCEF) IV  2 g Intravenous Q8H  . mometasone-formoterol  2 puff Inhalation BID  . pantoprazole  40 mg Oral Daily  . Warfarin - Pharmacist Dosing Inpatient   Does not apply q1800  . zolpidem  10 mg Oral QHS    Assessment: Patient wth St. Jude valve with goal INR 2.5-3.5 per prior anti-coag clinic notes.   Admitted with ALM and URI symptoms and sepsis.    Home dose warfarin 7.5mg  daily except 8mg  on wednesdays  08/19/2016 INR 2.18,  subtherapeutic Plts 82 Consuming regular diet DI: none noted  Goal of Therapy:  INR 2.5-3.5   Plan:  Warfarin 8 mg po  x 1 at 1800 today Daily INR  Dolly Rias RPh 08/19/2016, 9:03 AM Pager [phone]

## 2016-08-19 NOTE — Consult Note (Signed)
[facility] for Infectious Disease    Date of Admission:  08/17/2016           Day 2 cefazolin       Reason for Consult: automatic consultation for staph aureus bacteremia  Principal Problem:   Staphylococcus aureus bacteremia with sepsis ([place]) Active Problems:   S/P AVR (aortic valve replacement)   OSA (obstructive sleep apnea)   Hypokalemia   Elevated LFTs   Asthma, chronic, moderate persistent, uncomplicated   Normocytic anemia   Thrombocytopenia ([facility])   HTN (hypertension)   .  ceFAZolin (ANCEF) IV  2 g Intravenous Q8H  . mometasone-formoterol  2 puff Inhalation BID  . pantoprazole  40 mg Oral Daily  . Warfarin - Pharmacist Dosing Inpatient   Does not apply q1800  . zolpidem  10 mg Oral QHS    Recommendations: 1. Continue cefazolin  2. Await results of repeat blood cultures 3. Transthoracic echocardiogram  Assessment: He has MSSA bacteremia and is at relatively high risk for prosthetic valve endocarditis. Repeat blood cultures are pending. I have ordered a transthoracic echocardiogram. He probably should get a transesophageal echocardiogram early next week. I would hold off on PICC placement until we know blood cultures are negative. I will have Dr. Tommy Medal [phone]) review blood culture and echocardiogram results this weekend and make any changes that are necessary. I will follow-up on Monday, 08/22/2016. I have spoken to his wife by phone today reviewing his current condition and these plans.   HPI: Jeffery Whitehead is a 61 y.o. male who had acute onset of fever, chills, sweats and confusion 3 days ago. His wife, who is out of town in [place] caring for her 69 year old mother, noted that he was confused on the phone and called 911. He was admitted here where he was noted to be febrile, hypotensive and confused. He was started on broad empiric antibiotic therapy with vancomycin and piperacillin tazobactam. Both admission blood cultures have grown MSSA.  He is beginning to feel a little better and is less confused. He underwent St. Jude aortic valve replacement when he was 61 years old for his congenital bicuspid aortic valve. He has never had any complications from that surgery. He has obstructive sleep apnea otherwise he has been in good health. He recently lost his job in [employer] with [employer] when he was "downsized".   Review of Systems: Review of Systems  Constitutional: Positive for chills, diaphoresis, fever and malaise/fatigue. Negative for weight loss.  HENT: Negative for congestion and sore throat.   Respiratory: Positive for cough and shortness of breath. Negative for sputum production.        He has noted some slight increase in his chronic dyspnea on exertion over the past several months. He missed his schedule appointment with Dr. Londell Moh yesterday.  Cardiovascular: Negative for chest pain.  Gastrointestinal: Positive for diarrhea and nausea. Negative for abdominal pain, heartburn and vomiting.  Genitourinary: Negative for dysuria and frequency.  Musculoskeletal: Positive for myalgias. Negative for joint pain.  Neurological: Positive for weakness and headaches. Negative for dizziness.  Psychiatric/Behavioral: Negative for depression and substance abuse. The patient is not nervous/anxious.     Past Medical History:  Diagnosis Date  . Cold   . Decreased anal sphincter tone   . Heart valve replaced    1983   st jude  . Insomnia   . Shortness of breath dyspnea   . Sleep apnea  cpap  . Varicose vein of leg   . Varicose veins     Social History  Substance Use Topics  . Smoking status: Never Smoker  . Smokeless tobacco: Never Used  . Alcohol use 0.0 oz/week     Comment: wine at night    Family History  Problem Relation Age of Onset  . Heart disease Father    No Known Allergies  OBJECTIVE: Blood pressure (!) 82/44, pulse 63, temperature 97.9 F (36.6 C), temperature source Oral, resp. rate (!) 21, height  6' (1.829 m), weight 255 lb (115.7 kg), SpO2 97 %.  Physical Exam  Constitutional: He is oriented to person, place, and time.  He is sitting up in a chair. He appears to be slightly uncomfortable due to headache. He is alert and talkative.  HENT:  Mouth/Throat: No oropharyngeal exudate.  His teeth are in good condition. He has 2 upper front teeth.  Eyes: Conjunctivae are normal.  Neck: Neck supple.  Cardiovascular: Normal rate and regular rhythm.   Murmur heard. Crisp valve sounds. There is a soft, early 2/6 systolic murmur at the right upper sternal border  Pulmonary/Chest: Effort normal and breath sounds normal. He has no wheezes. He has no rales.  Abdominal: Soft. He exhibits no mass. There is no tenderness.  Musculoskeletal: Normal range of motion. He exhibits no edema or tenderness.  Neurological: He is alert and oriented to person, place, and time.  Skin: No rash noted.  No splinter or conjunctival hemorrhages.  Psychiatric: Mood and affect normal.    Lab Results Lab Results  Component Value Date   WBC 4.0 08/19/2016   HGB 11.2 (L) 08/19/2016   HCT 34.3 (L) 08/19/2016   MCV 86.4 08/19/2016   PLT 82 (L) 08/19/2016    Lab Results  Component Value Date   CREATININE 0.99 08/19/2016   BUN 14 08/19/2016   NA 136 08/19/2016   K 3.5 08/19/2016   CL 109 08/19/2016   CO2 22 08/19/2016    Lab Results  Component Value Date   ALT 77 (H) 08/18/2016   AST 100 (H) 08/18/2016   ALKPHOS 63 08/18/2016   BILITOT 1.8 (H) 08/18/2016     Microbiology: Recent Results (from the past 240 hour(s))  Culture, blood (Routine x 2)     Status: None (Preliminary result)   Collection Time: 08/17/16  9:05 PM  Result Value Ref Range Status   Specimen Description BLOOD RIGHT ANTECUBITAL  Final   Special Requests BOTTLES DRAWN AEROBIC AND ANAEROBIC 5 CC  Final   Culture  Setup Time   Final    GRAM POSITIVE COCCI IN CLUSTERS IN BOTH AEROBIC AND ANAEROBIC BOTTLES CRITICAL RESULT CALLED TO,  READ BACK BY AND VERIFIED WITH: Christean Grief Pharm.D. 13:00 08/18/16  ([email or web address]) Performed at [facility], [place] [address], [place], [place] [address]    Culture GRAM POSITIVE COCCI  Final   Report Status PENDING  Incomplete  Culture, blood (Routine x 2)     Status: None (Preliminary result)   Collection Time: 08/17/16  9:05 PM  Result Value Ref Range Status   Specimen Description BLOOD LEFT HAND  Final   Special Requests BOTTLES DRAWN AEROBIC AND ANAEROBIC 5 CC  Final   Culture  Setup Time   Final    GRAM POSITIVE COCCI IN CLUSTERS IN BOTH AEROBIC AND ANAEROBIC BOTTLES Organism ID to follow CRITICAL VALUE NOTED.  VALUE IS CONSISTENT WITH PREVIOUSLY REPORTED AND CALLED VALUE. Performed at [facility], [address], [place], [place] [address]    Culture GRAM POSITIVE COCCI  Final   Report Status PENDING  Incomplete  Blood Culture ID Panel (Reflexed)     Status: Abnormal   Collection Time: 08/17/16  9:05 PM  Result Value Ref Range Status   Enterococcus species NOT DETECTED NOT DETECTED Final   Listeria monocytogenes NOT DETECTED NOT DETECTED Final   Staphylococcus species DETECTED (A) NOT DETECTED Final    Comment: CRITICAL RESULT CALLED TO, READ BACK BY AND VERIFIED WITH: Mila Merry.D. 13:00 08/18/16 ([email or web address])    Staphylococcus aureus DETECTED (A) NOT DETECTED Final    Comment: Methicillin (oxacillin) susceptible Staphylococcus aureus (MSSA). Preferred therapy is anti staphylococcal beta lactam antibiotic (Cefazolin or Nafcillin), unless clinically contraindicated. CRITICAL RESULT CALLED TO, READ BACK BY AND VERIFIED WITH: Christean Grief Pharm.D. 13:00 08/18/16 ([email or web address])    Methicillin resistance NOT DETECTED NOT DETECTED Final   Streptococcus species NOT DETECTED NOT DETECTED Final   Streptococcus agalactiae NOT DETECTED NOT DETECTED Final   Streptococcus pneumoniae NOT DETECTED NOT DETECTED Final   Streptococcus pyogenes NOT DETECTED NOT DETECTED Final   Acinetobacter  baumannii NOT DETECTED NOT DETECTED Final   Enterobacteriaceae species NOT DETECTED NOT DETECTED Final   Enterobacter cloacae complex NOT DETECTED NOT DETECTED Final   Escherichia coli NOT DETECTED NOT DETECTED Final   Klebsiella oxytoca NOT DETECTED NOT DETECTED Final   Klebsiella pneumoniae NOT DETECTED NOT DETECTED Final   Proteus species NOT DETECTED NOT DETECTED Final   Serratia marcescens NOT DETECTED NOT DETECTED Final   Haemophilus influenzae NOT DETECTED NOT DETECTED Final   Neisseria meningitidis NOT DETECTED NOT DETECTED Final   Pseudomonas aeruginosa NOT DETECTED NOT DETECTED Final   Candida albicans NOT DETECTED NOT DETECTED Final   Candida glabrata NOT DETECTED NOT DETECTED Final   Candida krusei NOT DETECTED NOT DETECTED Final   Candida parapsilosis NOT DETECTED NOT DETECTED Final   Candida tropicalis NOT DETECTED NOT DETECTED Final    Comment: Performed at [facility], [address], [place], [place] [address]  MRSA PCR Screening     Status: None   Collection Time: 08/18/16  2:00 AM  Result Value Ref Range Status   MRSA by PCR NEGATIVE NEGATIVE Final    Comment:        The GeneXpert MRSA Assay (FDA approved for NASAL specimens only), is one component of a comprehensive MRSA colonization surveillance program. It is not intended to diagnose MRSA infection nor to guide or monitor treatment for MRSA infections.     Michel Bickers, MD [facility] for Infectious [place] [facility] [phone] pager   [phone] cell 08/19/2016, 9:01 AM

## 2016-08-19 NOTE — Progress Notes (Signed)
Patient informed this nurse that there was blood in the toilet. He stated he had a small bowel movement and urinated but that there was a drop of blood in the urinal earlier today when he urinated. Upon assessment, patient had a small dried area of red blood on tip of his penis, as well as the flat sheet that was covering him. Patient is not c/o any pain or burning with urination. [name] on call notified & made aware. No new orders given. Instructed patient not to flush the toilet/use urinal the next time so I would be able to witness if there was any blood. Will continue to monitor patient closely.

## 2016-08-19 NOTE — Progress Notes (Signed)
PROGRESS NOTE  Jeffery Whitehead  [id number] DOB: 07/02/1956 DOA: 08/17/2016 PCP: Charolette Forward, PA-C Outpatient Specialists:  Subjective: Had fever of 102.5 yesterday afternoon, but overall feels better. His blood cultures positive for MSSA.  Brief Narrative:  Jeffery Whitehead is a 61 y.o. 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 The patient was in his usual state of health until a few days ago when he developed URI symptoms of sinus congestion, fever.  Over the last two days this has progressed to generalized malaise, body aches, fever, chills, sore throat and headache.  Today, his wife called home in the evening to check on him and he seemed confused and slurred to her so she called 9-1-1.  EMS gave 1500 IVF en route and found him with temp 104F.  There has been no cough, sputum production, neck stiffness.  There has been no dysuria, urinary symptoms.  On arrival, he was somewhat altered, moderate hypotension.  Assessment & Plan:   Principal Problem:   Staphylococcus aureus bacteremia with sepsis ([place]) Active Problems:   OSA (obstructive sleep apnea)   S/P AVR (aortic valve replacement)   Hypokalemia   Elevated LFTs   Asthma, chronic, moderate persistent, uncomplicated   Normocytic anemia   Thrombocytopenia ([facility])   HTN (hypertension)   Sepsis. Presented with temperature of 101.3, respiratory rate of 33 and presence of bacteremia. Patient given IV fluids, blood pressure was soft on admission. Started on broad-spectrum antibiotics and Tamiflu. Currently antibiotics switched to cefazolin, Tamiflu discontinued. This is improving  MSSA bacteremia Unclear source, patient has St. Jude aortic mechanical valve, will obtain TTE. No evidence of pneumonia or other potential sources like skin ulcers, patient mentioned he has some sinus problems. Continue see  physical and for now, I appreciate the help of Dr. Megan Salon of ID service.   Acute encephalopathy Likely acute metabolic encephalopathy secondary to sepsis. This is resolved patient is back to his baseline.  Hypokalemia:  Presented with potassium of 2.9. This is replaced with oral supplements, potassium 3.5 today.  Elevated LFTs:  Possibly related to sepsis Check CMP in a.m.  OSA on CPAP:  Continue home CPAP  St. Jude AVR:  Continue warfarin, dosed per pharmacy, INR is 2.1 today.  check 2-D echo to rule out any vegetation in the presence of MSSA bacteremia  Anemia:  This is new. No evidence of bleeding. Trend hemoglobin  Asthma: Scant wheezing with exam. -Continue Symbicort -Albuterol when necessary  Hypertension: Hypotensive at admission -Hold lisinopril until hemodynamics clearer  Thrombocytopenia Platelets was 143 on admission, went down to 82, this is likely secondary to sepsis. Follow with CBC daily.   DVT prophylaxis: On full anticoagulation with warfarin.  Code Status: Full Code Family Communication:  Disposition Plan:  Diet: Diet regular Room service appropriate? Yes; Fluid consistency: Thin  Consultants:   None  Procedures:   None  Antimicrobials:   None   Objective: Vitals:   08/19/16 0400 08/19/16 0800 08/19/16 0945 08/19/16 1000  BP: (!) 82/44  114/82 130/77  Pulse: 74 63 73 78  Resp: (!) 23 (!) 21 14 (!) 27  Temp:  97.9 F (36.6 C)    TempSrc:  Oral    SpO2: 95% 97% 99% 100%  Weight:      Height:        Intake/Output Summary (Last 24 hours) at 08/19/16 1103 Last data  filed at 08/19/16 1000  Gross per 24 hour  Intake             4320 ml  Output                0 ml  Net             4320 ml   Filed Weights   08/17/16 2048  Weight: 115.7 kg (255 lb)    Examination: General exam: Appears calm and comfortable  Respiratory system: Clear to auscultation. Respiratory effort normal. Cardiovascular system: S1 & S2 heard,  RRR. No JVD, murmurs, rubs, gallops or clicks. No pedal edema. Gastrointestinal system: Abdomen is nondistended, soft and nontender. No organomegaly or masses felt. Normal bowel sounds heard. Central nervous system: Alert and oriented. No focal neurological deficits. Extremities: Symmetric 5 x 5 power. Skin: No rashes, lesions or ulcers Psychiatry: Judgement and insight appear normal. Mood & affect appropriate.   Data Reviewed: I have personally reviewed following labs and imaging studies  CBC:  Recent Labs Lab 08/17/16 2104 08/18/16 0351 08/19/16 0403  WBC 12.4* 11.3* 4.0  NEUTROABS 11.9*  --   --   HGB 11.5* 12.0* 11.2*  HCT 34.8* 36.1* 34.3*  MCV 84.1 86.0 86.4  PLT 143* 142* 82*   Basic Metabolic Panel:  Recent Labs Lab 08/17/16 2104 08/18/16 0351 08/19/16 0403  NA 135 136 136  K 2.9* 3.4* 3.5  CL 106 106 109  CO2 22 23 22   GLUCOSE 135* 111* 129*  BUN 19 21* 14  CREATININE 1.00 1.09 0.99  CALCIUM 7.8* 7.9* 7.9*   GFR: Estimated Creatinine Clearance: 102.9 mL/min (by C-G formula based on SCr of 0.99 mg/dL). Liver Function Tests:  Recent Labs Lab 08/17/16 2104 08/18/16 0351  AST 72* 100*  ALT 59 77*  ALKPHOS 62 63  BILITOT 1.6* 1.8*  PROT 6.4* 6.4*  ALBUMIN 3.8 3.5   No results for input(s): LIPASE, AMYLASE in the last 168 hours. No results for input(s): AMMONIA in the last 168 hours. Coagulation Profile:  Recent Labs Lab 08/17/16 2104 08/18/16 0351 08/19/16 0403  INR 2.67 2.34 2.18   Cardiac Enzymes: No results for input(s): CKTOTAL, CKMB, CKMBINDEX, TROPONINI in the last 168 hours. BNP (last 3 results) No results for input(s): PROBNP in the last 8760 hours. HbA1C: No results for input(s): HGBA1C in the last 72 hours. CBG: No results for input(s): GLUCAP in the last 168 hours. Lipid Profile: No results for input(s): CHOL, HDL, LDLCALC, TRIG, CHOLHDL, LDLDIRECT in the last 72 hours. Thyroid Function Tests: No results for input(s): TSH,  T4TOTAL, FREET4, T3FREE, THYROIDAB in the last 72 hours. Anemia Panel: No results for input(s): VITAMINB12, FOLATE, FERRITIN, TIBC, IRON, RETICCTPCT in the last 72 hours. Urine analysis:    Component Value Date/Time   COLORURINE YELLOW 08/18/2016 0044   APPEARANCEUR HAZY (A) 08/18/2016 0044   LABSPEC 1.019 08/18/2016 0044   PHURINE 5.0 08/18/2016 0044   GLUCOSEU NEGATIVE 08/18/2016 0044   HGBUR MODERATE (A) 08/18/2016 0044   BILIRUBINUR NEGATIVE 08/18/2016 0044   KETONESUR NEGATIVE 08/18/2016 0044   PROTEINUR 30 (A) 08/18/2016 0044   NITRITE NEGATIVE 08/18/2016 0044   LEUKOCYTESUR NEGATIVE 08/18/2016 0044   Sepsis Labs: @LABRCNTIP (procalcitonin:4,lacticidven:4)  ) Recent Results (from the past 240 hour(s))  Culture, blood (Routine x 2)     Status: None (Preliminary result)   Collection Time: 08/17/16  9:05 PM  Result Value Ref Range Status   Specimen Description BLOOD RIGHT ANTECUBITAL  Final  Special Requests BOTTLES DRAWN AEROBIC AND ANAEROBIC 5 CC  Final   Culture  Setup Time   Final    GRAM POSITIVE COCCI IN CLUSTERS IN BOTH AEROBIC AND ANAEROBIC BOTTLES CRITICAL RESULT CALLED TO, READ BACK BY AND VERIFIED WITH: Christean Grief Pharm.D. 13:00 08/18/16  ([email or web address]) Performed at [facility], [place] [address] [place], [place] [address]    Culture GRAM POSITIVE COCCI  Final   Report Status PENDING  Incomplete  Culture, blood (Routine x 2)     Status: None (Preliminary result)   Collection Time: 08/17/16  9:05 PM  Result Value Ref Range Status   Specimen Description BLOOD LEFT HAND  Final   Special Requests BOTTLES DRAWN AEROBIC AND ANAEROBIC 5 CC  Final   Culture  Setup Time   Final    GRAM POSITIVE COCCI IN CLUSTERS IN BOTH AEROBIC AND ANAEROBIC BOTTLES Organism ID to follow CRITICAL VALUE NOTED.  VALUE IS CONSISTENT WITH PREVIOUSLY REPORTED AND CALLED VALUE. Performed at [facility], [place] [address], [place], [place] [address]    Culture GRAM POSITIVE COCCI   Final   Report Status PENDING  Incomplete  Blood Culture ID Panel (Reflexed)     Status: Abnormal   Collection Time: 08/17/16  9:05 PM  Result Value Ref Range Status   Enterococcus species NOT DETECTED NOT DETECTED Final   Listeria monocytogenes NOT DETECTED NOT DETECTED Final   Staphylococcus species DETECTED (A) NOT DETECTED Final    Comment: CRITICAL RESULT CALLED TO, READ BACK BY AND VERIFIED WITH: Mila Merry.D. 13:00 08/18/16 ([email or web address])    Staphylococcus aureus DETECTED (A) NOT DETECTED Final    Comment: Methicillin (oxacillin) susceptible Staphylococcus aureus (MSSA). Preferred therapy is anti staphylococcal beta lactam antibiotic (Cefazolin or Nafcillin), unless clinically contraindicated. CRITICAL RESULT CALLED TO, READ BACK BY AND VERIFIED WITH: Christean Grief Pharm.D. 13:00 08/18/16 ([email or web address])    Methicillin resistance NOT DETECTED NOT DETECTED Final   Streptococcus species NOT DETECTED NOT DETECTED Final   Streptococcus agalactiae NOT DETECTED NOT DETECTED Final   Streptococcus pneumoniae NOT DETECTED NOT DETECTED Final   Streptococcus pyogenes NOT DETECTED NOT DETECTED Final   Acinetobacter baumannii NOT DETECTED NOT DETECTED Final   Enterobacteriaceae species NOT DETECTED NOT DETECTED Final   Enterobacter cloacae complex NOT DETECTED NOT DETECTED Final   Escherichia coli NOT DETECTED NOT DETECTED Final   Klebsiella oxytoca NOT DETECTED NOT DETECTED Final   Klebsiella pneumoniae NOT DETECTED NOT DETECTED Final   Proteus species NOT DETECTED NOT DETECTED Final   Serratia marcescens NOT DETECTED NOT DETECTED Final   Haemophilus influenzae NOT DETECTED NOT DETECTED Final   Neisseria meningitidis NOT DETECTED NOT DETECTED Final   Pseudomonas aeruginosa NOT DETECTED NOT DETECTED Final   Candida albicans NOT DETECTED NOT DETECTED Final   Candida glabrata NOT DETECTED NOT DETECTED Final   Candida krusei NOT DETECTED NOT DETECTED Final   Candida parapsilosis NOT DETECTED NOT DETECTED  Final   Candida tropicalis NOT DETECTED NOT DETECTED Final    Comment: Performed at [facility], [address] [place], [place] [address]  MRSA PCR Screening     Status: None   Collection Time: 08/18/16  2:00 AM  Result Value Ref Range Status   MRSA by PCR NEGATIVE NEGATIVE Final    Comment:        The GeneXpert MRSA Assay (FDA approved for NASAL specimens only), is one component of a comprehensive MRSA colonization surveillance program. It is not intended to  diagnose MRSA infection nor to guide or monitor treatment for MRSA infections.      Invalid input(s): PROCALCITONIN, LACTICACIDVEN   Radiology Studies: Dg Chest 2 View  Result Date: 08/17/2016 CLINICAL DATA:  Body aches and fever EXAM: CHEST  2 VIEW COMPARISON:  None. FINDINGS: Cardiomediastinal silhouette is upper limits normal. No focal airspace consolidation or pulmonary edema. No pneumothorax or pleural effusion. IMPRESSION: No active cardiopulmonary disease. Electronically Signed   By: Ulyses Jarred M.D.   On: 08/17/2016 22:24   Ct Head Wo Contrast  Result Date: 08/17/2016 CLINICAL DATA:  Altered mental status and dizziness EXAM: CT HEAD WITHOUT CONTRAST TECHNIQUE: Contiguous axial images were obtained from the base of the skull through the vertex without intravenous contrast. COMPARISON:  None. FINDINGS: Brain: No mass lesion, intraparenchymal hemorrhage or extra-axial collection. No evidence of acute cortical infarct. Brain parenchyma and CSF-containing spaces are normal for age. Vascular: No hyperdense vessel or unexpected calcification. Skull: Normal visualized skull base, calvarium and extracranial soft tissues. Sinuses/Orbits: No sinus fluid levels or advanced mucosal thickening. No mastoid effusion. Normal orbits. IMPRESSION: Normal CT of the brain. Electronically Signed   By: Ulyses Jarred M.D.   On: 08/17/2016 22:03   US Abdomen Limited Ruq  Result Date: 08/18/2016 CLINICAL DATA:  Elevated liver function  tests. EXAM: US ABDOMEN LIMITED - RIGHT UPPER QUADRANT COMPARISON:  None. FINDINGS: Gallbladder: No gallstones or wall thickening visualized. No sonographic Murphy sign noted by sonographer. Common bile duct: Diameter: 0.2 cm Liver: No focal lesion or intrahepatic biliary ductal dilatation. Diffusely increased echogenicity is seen. IMPRESSION: Fatty infiltration of the liver. The examination is otherwise negative. Electronically Signed   By: Inge Rise M.D.   On: 08/18/2016 15:58        Scheduled Meds: .  ceFAZolin (ANCEF) IV  2 g Intravenous Q8H  . mometasone-formoterol  2 puff Inhalation BID  . pantoprazole  40 mg Oral Daily  . warfarin  8 mg Oral ONCE-1800  . Warfarin - Pharmacist Dosing Inpatient   Does not apply q1800  . zolpidem  10 mg Oral QHS   Continuous Infusions: . sodium chloride 0.9 % 1,000 mL with potassium chloride 20 mEq infusion 100 mL/hr at 08/19/16 1013     LOS: 1 day    Time spent: 35 minutes    Cristina Mattern A, MD [facility] Pager [phone]  If 7PM-7AM, please contact night-coverage [email or web address] Password [email or web address] 08/19/2016, 11:03 AM

## 2016-08-19 NOTE — Progress Notes (Signed)
Pt arrived to unit from ICU in w/c. Stood and took few steps to bed. Pt denies pain/discomfort. VSS. Pt oriented to callbell and environment. POC discussed.

## 2016-08-19 NOTE — Progress Notes (Signed)
Called to pt room, pt states "I think I'm hallucinating". Report seeing "Roman architecture and dancing flamingoes" when he closes his eyes. Pt received Vicodin at 1531. He reports the same experience after receiving Vicodin earlier this AM but did not report it to staff at that time. Pt instructed this is likely a side effect of the narcotic pain medication and verbalized feeling reassured. Will monitor pt and make Dr Hartford Poli aware.

## 2016-08-19 NOTE — Procedures (Signed)
Pt placed on cpap by RN.  Pt is resting comfortably.

## 2016-08-20 LAB — COMPREHENSIVE METABOLIC PANEL
ALT: 69 U/L — ABNORMAL HIGH (ref 17–63)
ANION GAP: 5 (ref 5–15)
AST: 68 U/L — AB (ref 15–41)
Albumin: 3.1 g/dL — ABNORMAL LOW (ref 3.5–5.0)
Alkaline Phosphatase: 109 U/L (ref 38–126)
BILIRUBIN TOTAL: 1.3 mg/dL — AB (ref 0.3–1.2)
BUN: 11 mg/dL (ref 6–20)
CHLORIDE: 105 mmol/L (ref 101–111)
CO2: 26 mmol/L (ref 22–32)
Calcium: 8.4 mg/dL — ABNORMAL LOW (ref 8.9–10.3)
Creatinine, Ser: 0.78 mg/dL (ref 0.61–1.24)
GFR calc Af Amer: >60 mL/min (ref >60)
Glucose, Bld: 114 mg/dL — ABNORMAL HIGH (ref 65–99)
POTASSIUM: 3.6 mmol/L (ref 3.5–5.1)
Sodium: 136 mmol/L (ref 135–145)
TOTAL PROTEIN: 6.2 g/dL — AB (ref 6.5–8.1)

## 2016-08-20 LAB — CBC
HEMATOCRIT: 33.7 % — AB (ref 39.0–52.0)
HEMOGLOBIN: 11.3 g/dL — AB (ref 13.0–17.0)
MCH: 28.3 pg (ref 26.0–34.0)
MCHC: 33.5 g/dL (ref 30.0–36.0)
MCV: 84.3 fL (ref 78.0–100.0)
Platelets: 97 10*3/uL — ABNORMAL LOW (ref 150–400)
RBC: 4 MIL/uL — AB (ref 4.22–5.81)
RDW: 13.8 % (ref 11.5–15.5)
WBC: 4.4 10*3/uL (ref 4.0–10.5)

## 2016-08-20 LAB — CULTURE, BLOOD (ROUTINE X 2)

## 2016-08-20 LAB — PROCALCITONIN: PROCALCITONIN: 1.13 ng/mL

## 2016-08-20 LAB — PROTIME-INR
INR: 2.36
PROTHROMBIN TIME: 26.2 s — AB (ref 11.4–15.2)

## 2016-08-20 MED ORDER — WARFARIN SODIUM 6 MG PO TABS
9.0000 mg | ORAL_TABLET | Freq: Once | ORAL | Status: AC
  Administered 2016-08-20: 18:00:00 9 mg via ORAL
  Filled 2016-08-20: qty 1

## 2016-08-20 MED ORDER — METHOCARBAMOL 500 MG PO TABS
500.0000 mg | ORAL_TABLET | Freq: Four times a day (QID) | ORAL | Status: AC | PRN
  Administered 2016-08-20 – 2016-08-21 (×2): 500 mg via ORAL
  Filled 2016-08-20 (×2): qty 1

## 2016-08-20 MED ORDER — POTASSIUM CHLORIDE CRYS ER 20 MEQ PO TBCR
40.0000 meq | EXTENDED_RELEASE_TABLET | Freq: Once | ORAL | Status: AC
Start: 2016-08-20 — End: 2016-08-20
  Administered 2016-08-20: 40 meq via ORAL
  Filled 2016-08-20: qty 2

## 2016-08-20 MED ORDER — KETOROLAC TROMETHAMINE 30 MG/ML IJ SOLN
30.0000 mg | Freq: Three times a day (TID) | INTRAMUSCULAR | Status: AC | PRN
  Administered 2016-08-20 – 2016-08-25 (×15): 30 mg via INTRAVENOUS
  Filled 2016-08-20 (×14): qty 1

## 2016-08-20 NOTE — Progress Notes (Signed)
PROGRESS NOTE  Jeffery Whitehead  [id number] DOB: 08-04-1955 DOA: 08/17/2016 PCP: Charolette Forward, PA-C Outpatient Specialists:  Subjective: Feels okay has some back pain, thought he pulled a muscle. Denies other complaints, reported some hallucinations with Vicodin. 2-D echo looks okay, contact cardiology for TEE, keep an eye on his back if continues to hurt might need an MRI.  Brief Narrative:  Jeffery Whitehead is a 61 y.o. 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 The patient was in his usual state of health until a few days ago when he developed URI symptoms of sinus congestion, fever.  Over the last two days this has progressed to generalized malaise, body aches, fever, chills, sore throat and headache.  Today, his wife called home in the evening to check on him and he seemed confused and slurred to her so she called 9-1-1.  EMS gave 1500 IVF en route and found him with temp 104F.  There has been no cough, sputum production, neck stiffness.  There has been no dysuria, urinary symptoms.  On arrival, he was somewhat altered, moderate hypotension.  Assessment & Plan:   Principal Problem:   Staphylococcus aureus bacteremia with sepsis ([facility]) Active Problems:   OSA (obstructive sleep apnea)   S/P AVR (aortic valve replacement)   Hypokalemia   Elevated LFTs   Asthma, chronic, moderate persistent, uncomplicated   Normocytic anemia   Thrombocytopenia (H[facility])   HTN (hypertension)   Sepsis. Presented with temperature of 101.3, respiratory rate of 33 and presence of bacteremia. Patient given IV fluids, blood pressure was soft on admission. Started on broad-spectrum antibiotics and Tamiflu. Sepsis physiology resolved, continue current antibiotics. Cultures repeated on 2/16 NGTD  MSSA bacteremia Unclear source, patient has St. Jude aortic mechanical valve, will  obtain TTE. No evidence of pneumonia or other potential sources like skin ulcers, patient mentioned he has some sinus problems. 2-D echo showed no evidence of vegetations on his mechanical valve, will obtain a TEE.  Acute encephalopathy Likely acute metabolic encephalopathy secondary to sepsis. This is resolved patient is back to his baseline.  Hypokalemia:  Presented with potassium of 2.9. This is replaced with oral supplements, potassium 3.5 today.  Elevated LFTs:  Possibly related to sepsis Ultrasound showed fatty infiltration of his liver.  OSA on CPAP:  Continue home CPAP  St. Jude AVR:  Continue warfarin, dosed per pharmacy, INR is 2.1 today.  check 2-D echo to rule out any vegetation in the presence of MSSA bacteremia  Anemia:  This is new. No evidence of bleeding. Trend hemoglobin  Asthma: Scant wheezing with exam. -Continue Symbicort -Albuterol when necessary  Hypertension: Hypotensive at admission -Hold lisinopril until hemodynamics clearer  Thrombocytopenia Platelets was 143 on admission, went down to 82, this is likely secondary to sepsis. Follow with CBC daily. This is improving today is 97   DVT prophylaxis: On full anticoagulation with warfarin.  Code Status: Full Code Family Communication:  Disposition Plan:  Diet: Diet regular Room service appropriate? Yes; Fluid consistency: Thin  Consultants:   None  Procedures:   None  Antimicrobials:   None   Objective: Vitals:   08/19/16 1200 08/19/16 1245 08/19/16 2154 08/20/16 0431  BP: 121/75 139/84 (!) 143/80 (!) 142/96  Pulse: 72 71 94 85  Resp: 19 18 18 18   Temp:  99 F (37.2 C) 99.6 F (37.6 C) 99.1 F (37.3 C)  TempSrc:  Oral Oral Oral  SpO2: 96% 99% 98% 98%  Weight:      Height:        Intake/Output Summary (Last 24 hours) at 08/20/16 1016 Last data filed at 08/20/16 0600  Gross per 24 hour  Intake          1651.67 ml  Output             2050 ml  Net           -398.33 ml   Filed Weights   08/17/16 2048  Weight: 115.7 kg (255 lb)    Examination: General exam: Appears calm and comfortable  Respiratory system: Clear to auscultation. Respiratory effort normal. Cardiovascular system: S1 & S2 heard, RRR. No JVD, murmurs, rubs, gallops or clicks. No pedal edema. Gastrointestinal system: Abdomen is nondistended, soft and nontender. No organomegaly or masses felt. Normal bowel sounds heard. Central nervous system: Alert and oriented. No focal neurological deficits. Extremities: Symmetric 5 x 5 power. Skin: No rashes, lesions or ulcers Psychiatry: Judgement and insight appear normal. Mood & affect appropriate.   Data Reviewed: I have personally reviewed following labs and imaging studies  CBC:  Recent Labs Lab 08/17/16 2104 08/18/16 0351 08/19/16 0403 08/20/16 0535  WBC 12.4* 11.3* 4.0 4.4  NEUTROABS 11.9*  --   --   --   HGB 11.5* 12.0* 11.2* 11.3*  HCT 34.8* 36.1* 34.3* 33.7*  MCV 84.1 86.0 86.4 84.3  PLT 143* 142* 82* 97*   Basic Metabolic Panel:  Recent Labs Lab 08/17/16 2104 08/18/16 0351 08/19/16 0403 08/20/16 0535  NA 135 136 136 136  K 2.9* 3.4* 3.5 3.6  CL 106 106 109 105  CO2 22 23 22 26   GLUCOSE 135* 111* 129* 114*  BUN 19 21* 14 11  CREATININE 1.00 1.09 0.99 0.78  CALCIUM 7.8* 7.9* 7.9* 8.4*   GFR: Estimated Creatinine Clearance: 127.3 mL/min (by C-G formula based on SCr of 0.78 mg/dL). Liver Function Tests:  Recent Labs Lab 08/17/16 2104 08/18/16 0351 08/20/16 0535  AST 72* 100* 68*  ALT 59 77* 69*  ALKPHOS 62 63 109  BILITOT 1.6* 1.8* 1.3*  PROT 6.4* 6.4* 6.2*  ALBUMIN 3.8 3.5 3.1*   No results for input(s): LIPASE, AMYLASE in the last 168 hours. No results for input(s): AMMONIA in the last 168 hours. Coagulation Profile:  Recent Labs Lab 08/17/16 2104 08/18/16 0351 08/19/16 0403 08/20/16 0535  INR 2.67 2.34 2.18 2.36   Cardiac Enzymes: No results for input(s): CKTOTAL, CKMB, CKMBINDEX,  TROPONINI in the last 168 hours. BNP (last 3 results) No results for input(s): PROBNP in the last 8760 hours. HbA1C: No results for input(s): HGBA1C in the last 72 hours. CBG: No results for input(s): GLUCAP in the last 168 hours. Lipid Profile: No results for input(s): CHOL, HDL, LDLCALC, TRIG, CHOLHDL, LDLDIRECT in the last 72 hours. Thyroid Function Tests: No results for input(s): TSH, T4TOTAL, FREET4, T3FREE, THYROIDAB in the last 72 hours. Anemia Panel: No results for input(s): VITAMINB12, FOLATE, FERRITIN, TIBC, IRON, RETICCTPCT in the last 72 hours. Urine analysis:    Component Value Date/Time   COLORURINE YELLOW 08/18/2016 0044   APPEARANCEUR HAZY (A) 08/18/2016 0044   LABSPEC 1.019 08/18/2016 0044   PHURINE 5.0 08/18/2016 0044   GLUCOSEU NEGATIVE 08/18/2016 0044   HGBUR MODERATE (A) 08/18/2016 0044   BILIRUBINUR NEGATIVE 08/18/2016 [place] 08/18/2016 0044   PROTEINUR 30 (A) 08/18/2016 0044   NITRITE NEGATIVE 08/18/2016 0044  LEUKOCYTESUR NEGATIVE 08/18/2016 0044   Sepsis Labs: @LABRCNTIP (procalcitonin:4,lacticidven:4)  ) Recent Results (from the past 240 hour(s))  Culture, blood (Routine x 2)     Status: Abnormal   Collection Time: 08/17/16  9:05 PM  Result Value Ref Range Status   Specimen Description BLOOD RIGHT ANTECUBITAL  Final   Special Requests BOTTLES DRAWN AEROBIC AND ANAEROBIC 5 CC  Final   Culture  Setup Time   Final    GRAM POSITIVE COCCI IN CLUSTERS IN BOTH AEROBIC AND ANAEROBIC BOTTLES CRITICAL RESULT CALLED TO, READ BACK BY AND VERIFIED WITH: Christean Grief Pharm.D. 13:00 08/18/16  ([email or web address])    Culture (A)  Final    STAPHYLOCOCCUS AUREUS SUSCEPTIBILITIES PERFORMED ON PREVIOUS CULTURE WITHIN THE LAST 5 DAYS. Performed at [facility], [place] [address] [place], [place] [address]    Report Status 08/20/2016 FINAL  Final  Culture, blood (Routine x 2)     Status: Abnormal   Collection Time: 08/17/16  9:05 PM  Result Value Ref  Range Status   Specimen Description BLOOD LEFT HAND  Final   Special Requests BOTTLES DRAWN AEROBIC AND ANAEROBIC 5 CC  Final   Culture  Setup Time   Final    GRAM POSITIVE COCCI IN CLUSTERS IN BOTH AEROBIC AND ANAEROBIC BOTTLES CRITICAL VALUE NOTED.  VALUE IS CONSISTENT WITH PREVIOUSLY REPORTED AND CALLED VALUE. Performed at [facility], [place] [address] [place], [place] [address]    Culture STAPHYLOCOCCUS AUREUS (A)  Final   Report Status 08/20/2016 FINAL  Final   Organism ID, Bacteria STAPHYLOCOCCUS AUREUS  Final      Susceptibility   Staphylococcus aureus - MIC*    CIPROFLOXACIN <=0.5 SENSITIVE Sensitive     ERYTHROMYCIN 0.5 SENSITIVE Sensitive     GENTAMICIN <=0.5 SENSITIVE Sensitive     OXACILLIN 0.5 SENSITIVE Sensitive     TETRACYCLINE <=1 SENSITIVE Sensitive     VANCOMYCIN 1 SENSITIVE Sensitive     TRIMETH/SULFA <=10 SENSITIVE Sensitive     CLINDAMYCIN <=0.25 SENSITIVE Sensitive     RIFAMPIN <=0.5 SENSITIVE Sensitive     Inducible Clindamycin NEGATIVE Sensitive     * STAPHYLOCOCCUS AUREUS  Blood Culture ID Panel (Reflexed)     Status: Abnormal   Collection Time: 08/17/16  9:05 PM  Result Value Ref Range Status   Enterococcus species NOT DETECTED NOT DETECTED Final   Listeria monocytogenes NOT DETECTED NOT DETECTED Final   Staphylococcus species DETECTED (A) NOT DETECTED Final    Comment: CRITICAL RESULT CALLED TO, READ BACK BY AND VERIFIED WITH: Mila Merry.D. 13:00 08/18/16 ([email or web address])    Staphylococcus aureus DETECTED (A) NOT DETECTED Final    Comment: Methicillin (oxacillin) susceptible Staphylococcus aureus (MSSA). Preferred therapy is anti staphylococcal beta lactam antibiotic (Cefazolin or Nafcillin), unless clinically contraindicated. CRITICAL RESULT CALLED TO, READ BACK BY AND VERIFIED WITH: Christean Grief Pharm.D. 13:00 08/18/16 ([email or web address])    Methicillin resistance NOT DETECTED NOT DETECTED Final   Streptococcus species NOT DETECTED NOT DETECTED Final    Streptococcus agalactiae NOT DETECTED NOT DETECTED Final   Streptococcus pneumoniae NOT DETECTED NOT DETECTED Final   Streptococcus pyogenes NOT DETECTED NOT DETECTED Final   Acinetobacter baumannii NOT DETECTED NOT DETECTED Final   Enterobacteriaceae species NOT DETECTED NOT DETECTED Final   Enterobacter cloacae complex NOT DETECTED NOT DETECTED Final   Escherichia coli NOT DETECTED NOT DETECTED Final   Klebsiella oxytoca NOT DETECTED NOT DETECTED Final   Klebsiella pneumoniae NOT DETECTED NOT DETECTED Final   Proteus  species NOT DETECTED NOT DETECTED Final   Serratia marcescens NOT DETECTED NOT DETECTED Final   Haemophilus influenzae NOT DETECTED NOT DETECTED Final   Neisseria meningitidis NOT DETECTED NOT DETECTED Final   Pseudomonas aeruginosa NOT DETECTED NOT DETECTED Final   Candida albicans NOT DETECTED NOT DETECTED Final   Candida glabrata NOT DETECTED NOT DETECTED Final   Candida krusei NOT DETECTED NOT DETECTED Final   Candida parapsilosis NOT DETECTED NOT DETECTED Final   Candida tropicalis NOT DETECTED NOT DETECTED Final    Comment: Performed at [facility], [place] [address], [place], [place] [address]  Urine culture     Status: Abnormal (Preliminary result)   Collection Time: 08/18/16 12:52 AM  Result Value Ref Range Status   Specimen Description URINE, CLEAN CATCH  Final   Special Requests NONE  Final   Culture (A)  Final    50,000 COLONIES/mL STAPHYLOCOCCUS SPECIES (COAGULASE NEGATIVE) SUSCEPTIBILITIES TO FOLLOW Performed at [facility], [place] [address] [place], [place] [address]    Report Status PENDING  Incomplete  MRSA PCR Screening     Status: None   Collection Time: 08/18/16  2:00 AM  Result Value Ref Range Status   MRSA by PCR NEGATIVE NEGATIVE Final    Comment:        The GeneXpert MRSA Assay (FDA approved for NASAL specimens only), is one component of a comprehensive MRSA colonization surveillance program. It is not intended to diagnose  MRSA infection nor to guide or monitor treatment for MRSA infections.   Culture, blood (Routine X 2) w Reflex to ID Panel     Status: None (Preliminary result)   Collection Time: 08/19/16  8:01 AM  Result Value Ref Range Status   Specimen Description BLOOD RIGHT HAND  Final   Special Requests IN PEDIATRIC BOTTLE 3CC  Final   Culture  Setup Time   Final    GRAM POSITIVE COCCI IN CLUSTERS AEROBIC BOTTLE ONLY CRITICAL VALUE NOTED.  VALUE IS CONSISTENT WITH PREVIOUSLY REPORTED AND CALLED VALUE. Performed at [facility], [place] [address], [place], [place] [address]    Culture PENDING  Incomplete   Report Status PENDING  Incomplete     Invalid input(s): PROCALCITONIN, LACTICACIDVEN   Radiology Studies: US Abdomen Limited Ruq  Result Date: 08/18/2016 CLINICAL DATA:  Elevated liver function tests. EXAM: US ABDOMEN LIMITED - RIGHT UPPER QUADRANT COMPARISON:  None. FINDINGS: Gallbladder: No gallstones or wall thickening visualized. No sonographic Murphy sign noted by sonographer. Common bile duct: Diameter: 0.2 cm Liver: No focal lesion or intrahepatic biliary ductal dilatation. Diffusely increased echogenicity is seen. IMPRESSION: Fatty infiltration of the liver. The examination is otherwise negative. Electronically Signed   By: Inge Rise M.D.   On: 08/18/2016 15:58        Scheduled Meds: .  ceFAZolin (ANCEF) IV  2 g Intravenous Q8H  . mometasone-formoterol  2 puff Inhalation BID  . pantoprazole  40 mg Oral Daily  . warfarin  9 mg Oral ONCE-1800  . Warfarin - Pharmacist Dosing Inpatient   Does not apply q1800  . zolpidem  10 mg Oral QHS   Continuous Infusions:    LOS: 2 days    Time spent: 35 minutes    Bronte Kropf A, MD [facility] Pager [phone]  If 7PM-7AM, please contact night-coverage [email or web address] Password [email or web address] 08/20/2016, 10:16 AM

## 2016-08-20 NOTE — Progress Notes (Signed)
[place] for warfarin Indication: Mechanical Valve St. Jude  No Known Allergies  Patient Measurements: Height: 6' (182.9 cm) Weight: 255 lb (115.7 kg) IBW/kg (Calculated) : 77.6 Heparin Dosing Weight:   Vital Signs: Temp: 99.1 F (37.3 C) (02/17 0431) Temp Source: Oral (02/17 0431) BP: 142/96 (02/17 0431) Pulse Rate: 85 (02/17 0431)  Labs:  Recent Labs  08/18/16 0351 08/19/16 0403 08/20/16 0535  HGB 12.0* 11.2* 11.3*  HCT 36.1* 34.3* 33.7*  PLT 142* 82* 97*  LABPROT 26.0* 24.6* 26.2*  INR 2.34 2.18 2.36  CREATININE 1.09 0.99 0.78    Estimated Creatinine Clearance: 127.3 mL/min (by C-G formula based on SCr of 0.78 mg/dL).    Assessment: Patient wth St. Jude valve with goal INR 2.5-3.5 per prior anti-coag clinic notes.   Admitted with ALM and URI symptoms and sepsis.    Home dose warfarin 7.5mg  daily except 8mg  on wednesdays  08/20/2016  INR 2.36, subtherapeutic but trending upward  CBC: Hgb decreased but stablew, pltc = 97 (improved from 2/16)   Diet: regular  Drug-drug interactions: no major interactions.  RN note 2/16 PM describes blood in toilet and drop of blood in urinal with dried blood on penis and bed sheet (MD made aware).  NO further bleeding this am per RN (was told bleeding last night was a very small amount).  Hgb unchanged   Goal of Therapy:  INR 2.5-3.5   Plan:  Warfarin 9 mg po x 1 at 1800 today Daily INR Monitor for bleeding  Doreene Eland, PharmD, BCPS.   Pager: [phone] 08/20/2016 9:44 AM    08/20/2016 9:39 AM

## 2016-08-21 LAB — CBC
HEMATOCRIT: 32.5 % — AB (ref 39.0–52.0)
Hemoglobin: 11 g/dL — ABNORMAL LOW (ref 13.0–17.0)
MCH: 28.6 pg (ref 26.0–34.0)
MCHC: 33.8 g/dL (ref 30.0–36.0)
MCV: 84.4 fL (ref 78.0–100.0)
PLATELETS: 108 10*3/uL — AB (ref 150–400)
RBC: 3.85 MIL/uL — ABNORMAL LOW (ref 4.22–5.81)
RDW: 14.2 % (ref 11.5–15.5)
WBC: 4.6 10*3/uL (ref 4.0–10.5)

## 2016-08-21 LAB — BASIC METABOLIC PANEL
Anion gap: 7 (ref 5–15)
BUN: 22 mg/dL — AB (ref 6–20)
CALCIUM: 8.7 mg/dL — AB (ref 8.9–10.3)
CO2: 24 mmol/L (ref 22–32)
CREATININE: 0.79 mg/dL (ref 0.61–1.24)
Chloride: 107 mmol/L (ref 101–111)
GFR calc Af Amer: >60 mL/min (ref >60)
GFR calc non Af Amer: >60 mL/min (ref >60)
GLUCOSE: 111 mg/dL — AB (ref 65–99)
Potassium: 3.6 mmol/L (ref 3.5–5.1)
SODIUM: 138 mmol/L (ref 135–145)

## 2016-08-21 LAB — URINE CULTURE: Culture: 50000 — AB

## 2016-08-21 LAB — PROTIME-INR
INR: 2.68
PROTHROMBIN TIME: 29.1 s — AB (ref 11.4–15.2)

## 2016-08-21 LAB — CULTURE, BLOOD (ROUTINE X 2)

## 2016-08-21 MED ORDER — POTASSIUM CHLORIDE CRYS ER 20 MEQ PO TBCR
40.0000 meq | EXTENDED_RELEASE_TABLET | Freq: Once | ORAL | Status: AC
  Administered 2016-08-21: 40 meq via ORAL
  Filled 2016-08-21: qty 2

## 2016-08-21 MED ORDER — WARFARIN SODIUM 5 MG PO TABS
7.5000 mg | ORAL_TABLET | Freq: Once | ORAL | Status: AC
  Administered 2016-08-21: 7.5 mg via ORAL
  Filled 2016-08-21: qty 1

## 2016-08-21 MED ORDER — CLONAZEPAM 0.5 MG PO TABS
0.2500 mg | ORAL_TABLET | Freq: Every day | ORAL | Status: DC
  Administered 2016-08-21 – 2016-08-25 (×5): 0.25 mg via ORAL
  Filled 2016-08-21 (×5): qty 1

## 2016-08-21 MED ORDER — GUAIFENESIN-DM 100-10 MG/5ML PO SYRP
5.0000 mL | ORAL_SOLUTION | ORAL | Status: DC | PRN
  Administered 2016-08-21 – 2016-08-26 (×6): 5 mL via ORAL
  Filled 2016-08-21 (×6): qty 10

## 2016-08-21 NOTE — Progress Notes (Addendum)
[place] for warfarin Indication: Mechanical Valve St. Jude  No Known Allergies  Patient Measurements: Height: 6' (182.9 cm) Weight: 255 lb (115.7 kg) IBW/kg (Calculated) : 77.6 Heparin Dosing Weight:   Vital Signs: Temp: 97.8 F (36.6 C) (02/18 0553) Temp Source: Oral (02/18 0553) BP: 151/88 (02/18 0553) Pulse Rate: 71 (02/18 0849)  Labs:  Recent Labs  08/19/16 0403 08/20/16 0535 08/21/16 0607  HGB 11.2* 11.3* 11.0*  HCT 34.3* 33.7* 32.5*  PLT 82* 97* 108*  LABPROT 24.6* 26.2* 29.1*  INR 2.18 2.36 2.68  CREATININE 0.99 0.78 0.79    Estimated Creatinine Clearance: 127.3 mL/min (by C-G formula based on SCr of 0.79 mg/dL).    Assessment: Patient wth St. Jude valve with goal INR 2.5-3.5 per prior anti-coag clinic notes.   Admitted with ALM and URI symptoms and sepsis.    Home dose warfarin 7.5mg  daily except 8mg  on wednesdays  08/21/2016  INR 2.68, therapeutic  CBC: Hgb decreased but stable, pltc = 108 (improving)   Diet: regular  Drug-drug interactions: no major interactions.  Started on toradol 30mg  IV q8h prn (requesting ~8-10hr).  Based on age and concomitant warfarin, suggest reducing dose to 15mg  IV q8h PRN and use for shortest time possible.   RN note 2/16 PM describes blood in toilet and drop of blood in urinal with dried blood on penis and bed sheet (MD made aware).  No further bleeding this am per RN (was told bleeding last night was a very small amount).  Hgb stable   Goal of Therapy:  INR 2.5-3.5   Plan:  Warfarin 7.5 mg po x 1 at 1800 today Daily INR Monitor for bleeding  Doreene Eland, PharmD, BCPS.   Pager: [phone] 08/21/2016 9:24 AM

## 2016-08-21 NOTE — Progress Notes (Signed)
PROGRESS NOTE  Jeffery Whitehead  [id number] DOB: 04-25-1956 DOA: 08/17/2016 PCP: Charolette Forward, PA-C Outpatient Specialists:  Subjective: Reported drenching sweat at night, but denies any fever or chills. Contacted cardiology again today, spoke with the PA, reported they have no access to the schedule over the weekend. There is a chance he will not have the TEE in the morning, anyway we'll keep him NPO after midnight  Brief Narrative:  Jeffery Whitehead is a 61 y.o. 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 The patient was in his usual state of health until a few days ago when he developed URI symptoms of sinus congestion, fever.  Over the last two days this has progressed to generalized malaise, body aches, fever, chills, sore throat and headache.  Today, his wife called home in the evening to check on him and he seemed confused and slurred to her so she called 9-1-1.  EMS gave 1500 IVF en route and found him with temp 104F.  There has been no cough, sputum production, neck stiffness.  There has been no dysuria, urinary symptoms.  On arrival, he was somewhat altered, moderate hypotension.  Assessment & Plan:   Principal Problem:   Staphylococcus aureus bacteremia with sepsis ([place]) Active Problems:   OSA (obstructive sleep apnea)   S/P AVR (aortic valve replacement)   Hypokalemia   Elevated LFTs   Asthma, chronic, moderate persistent, uncomplicated   Normocytic anemia   Thrombocytopenia (HCC)   HTN (hypertension)   Sepsis. Presented with temperature of 101.3, respiratory rate of 33 and presence of bacteremia. Patient given IV fluids, blood pressure was soft on admission. Started on broad-spectrum antibiotics and Tamiflu. Sepsis physiology resolved, continue current antibiotics. Cultures repeated on 2/16 NGTD  MSSA bacteremia Unclear source, patient  has St. Jude aortic mechanical valve, will obtain TTE. No evidence of pneumonia or other potential sources like skin ulcers, patient mentioned he has some sinus problems. 2-D echo showed no evidence of vegetations on his mechanical valve, will obtain a TEE. Cultures repeated, NGTD, await TEE. He will likely needs PICC line, when it is okay with infectious disease  Acute encephalopathy Likely acute metabolic encephalopathy secondary to sepsis. This is resolved patient is back to his baseline.  Hypokalemia:  Presented with potassium of 2.9. This is replaced with oral supplements, potassium 3.5 today.  Elevated LFTs:  Possibly related to sepsis Ultrasound showed fatty infiltration of his liver.  OSA on CPAP:  Continue home CPAP  St. Jude AVR:  Continue warfarin, dosed per pharmacy, INR is 2.1 today.  check 2-D echo to rule out any vegetation in the presence of MSSA bacteremia  Anemia:  This is new. No evidence of bleeding. Trend hemoglobin  Asthma: -Scant wheezing with exam. -Continue Symbicort -Albuterol when necessary  Hypertension: -Hypotensive at admission. -Hold lisinopril until hemodynamics clearer.  Thrombocytopenia Platelets was 143 on admission, went down to 82, this is likely secondary to sepsis. Follow with CBC daily. This is improving today is 97   DVT prophylaxis: On full anticoagulation with warfarin.  Code Status: Full Code Family Communication:  Disposition Plan:  Diet: Diet regular Room service appropriate? Yes; Fluid consistency: Thin Diet NPO time specified  Consultants:   None  Procedures:   None  Antimicrobials:   None   Objective: Vitals:   08/20/16 2138 08/20/16 2206 08/21/16 0553 08/21/16 0849  BP: 129/76  Marland Kitchen)  151/88   Pulse: 75 85 69 71  Resp: 18 17 18 17   Temp: 98.4 F (36.9 C)  97.8 F (36.6 C)   TempSrc: Oral  Oral   SpO2: 94% 96% 96% 96%  Weight:      Height:        Intake/Output Summary (Last 24 hours) at  08/21/16 1028 Last data filed at 08/21/16 0900  Gross per 24 hour  Intake              580 ml  Output                0 ml  Net              580 ml   Filed Weights   08/17/16 2048  Weight: 115.7 kg (255 lb)    Examination: General exam: Appears calm and comfortable  Respiratory system: Clear to auscultation. Respiratory effort normal. Cardiovascular system: S1 & S2 heard, RRR. No JVD, murmurs, rubs, gallops or clicks. No pedal edema. Gastrointestinal system: Abdomen is nondistended, soft and nontender. No organomegaly or masses felt. Normal bowel sounds heard. Central nervous system: Alert and oriented. No focal neurological deficits. Extremities: Symmetric 5 x 5 power. Skin: No rashes, lesions or ulcers Psychiatry: Judgement and insight appear normal. Mood & affect appropriate.   Data Reviewed: I have personally reviewed following labs and imaging studies  CBC:  Recent Labs Lab 08/17/16 2104 08/18/16 0351 08/19/16 0403 08/20/16 0535 08/21/16 0607  WBC 12.4* 11.3* 4.0 4.4 4.6  NEUTROABS 11.9*  --   --   --   --   HGB 11.5* 12.0* 11.2* 11.3* 11.0*  HCT 34.8* 36.1* 34.3* 33.7* 32.5*  MCV 84.1 86.0 86.4 84.3 84.4  PLT 143* 142* 82* 97* 123XX123*   Basic Metabolic Panel:  Recent Labs Lab 08/17/16 2104 08/18/16 0351 08/19/16 0403 08/20/16 0535 08/21/16 0607  NA 135 136 136 136 138  K 2.9* 3.4* 3.5 3.6 3.6  CL 106 106 109 105 107  CO2 22 23 22 26 24   GLUCOSE 135* 111* 129* 114* 111*  BUN 19 21* 14 11 22*  CREATININE 1.00 1.09 0.99 0.78 0.79  CALCIUM 7.8* 7.9* 7.9* 8.4* 8.7*   GFR: Estimated Creatinine Clearance: 127.3 mL/min (by C-G formula based on SCr of 0.79 mg/dL). Liver Function Tests:  Recent Labs Lab 08/17/16 2104 08/18/16 0351 08/20/16 0535  AST 72* 100* 68*  ALT 59 77* 69*  ALKPHOS 62 63 109  BILITOT 1.6* 1.8* 1.3*  PROT 6.4* 6.4* 6.2*  ALBUMIN 3.8 3.5 3.1*   No results for input(s): LIPASE, AMYLASE in the last 168 hours. No results for  input(s): AMMONIA in the last 168 hours. Coagulation Profile:  Recent Labs Lab 08/17/16 2104 08/18/16 0351 08/19/16 0403 08/20/16 0535 08/21/16 0607  INR 2.67 2.34 2.18 2.36 2.68   Cardiac Enzymes: No results for input(s): CKTOTAL, CKMB, CKMBINDEX, TROPONINI in the last 168 hours. BNP (last 3 results) No results for input(s): PROBNP in the last 8760 hours. HbA1C: No results for input(s): HGBA1C in the last 72 hours. CBG: No results for input(s): GLUCAP in the last 168 hours. Lipid Profile: No results for input(s): CHOL, HDL, LDLCALC, TRIG, CHOLHDL, LDLDIRECT in the last 72 hours. Thyroid Function Tests: No results for input(s): TSH, T4TOTAL, FREET4, T3FREE, THYROIDAB in the last 72 hours. Anemia Panel: No results for input(s): VITAMINB12, FOLATE, FERRITIN, TIBC, IRON, RETICCTPCT in the last 72 hours. Urine analysis:    Component Value Date/Time  COLORURINE YELLOW 08/18/2016 0044   APPEARANCEUR HAZY (A) 08/18/2016 0044   LABSPEC 1.019 08/18/2016 0044   PHURINE 5.0 08/18/2016 0044   GLUCOSEU NEGATIVE 08/18/2016 0044   HGBUR MODERATE (A) 08/18/2016 0044   BILIRUBINUR NEGATIVE 08/18/2016 0044   Mesic 08/18/2016 0044   PROTEINUR 30 (A) 08/18/2016 0044   NITRITE NEGATIVE 08/18/2016 0044   LEUKOCYTESUR NEGATIVE 08/18/2016 0044   Sepsis Labs: @LABRCNTIP (procalcitonin:4,lacticidven:4)  ) Recent Results (from the past 240 hour(s))  Culture, blood (Routine x 2)     Status: Abnormal   Collection Time: 08/17/16  9:05 PM  Result Value Ref Range Status   Specimen Description BLOOD RIGHT ANTECUBITAL  Final   Special Requests BOTTLES DRAWN AEROBIC AND ANAEROBIC 5 CC  Final   Culture  Setup Time   Final    GRAM POSITIVE COCCI IN CLUSTERS IN BOTH AEROBIC AND ANAEROBIC BOTTLES CRITICAL RESULT CALLED TO, READ BACK BY AND VERIFIED WITH: Christean Grief Pharm.D. 13:00 08/18/16  ([email or web address])    Culture (A)  Final    STAPHYLOCOCCUS AUREUS SUSCEPTIBILITIES PERFORMED ON PREVIOUS  CULTURE WITHIN THE LAST 5 DAYS. Performed at [facility], [place] [address] [place], [place] [address]    Report Status 08/20/2016 FINAL  Final  Culture, blood (Routine x 2)     Status: Abnormal   Collection Time: 08/17/16  9:05 PM  Result Value Ref Range Status   Specimen Description BLOOD LEFT HAND  Final   Special Requests BOTTLES DRAWN AEROBIC AND ANAEROBIC 5 CC  Final   Culture  Setup Time   Final    GRAM POSITIVE COCCI IN CLUSTERS IN BOTH AEROBIC AND ANAEROBIC BOTTLES CRITICAL VALUE NOTED.  VALUE IS CONSISTENT WITH PREVIOUSLY REPORTED AND CALLED VALUE. Performed at [facility], [place] [address], [place], [place] [address]    Culture STAPHYLOCOCCUS AUREUS (A)  Final   Report Status 08/20/2016 FINAL  Final   Organism ID, Bacteria STAPHYLOCOCCUS AUREUS  Final      Susceptibility   Staphylococcus aureus - MIC*    CIPROFLOXACIN <=0.5 SENSITIVE Sensitive     ERYTHROMYCIN 0.5 SENSITIVE Sensitive     GENTAMICIN <=0.5 SENSITIVE Sensitive     OXACILLIN 0.5 SENSITIVE Sensitive     TETRACYCLINE <=1 SENSITIVE Sensitive     VANCOMYCIN 1 SENSITIVE Sensitive     TRIMETH/SULFA <=10 SENSITIVE Sensitive     CLINDAMYCIN <=0.25 SENSITIVE Sensitive     RIFAMPIN <=0.5 SENSITIVE Sensitive     Inducible Clindamycin NEGATIVE Sensitive     * STAPHYLOCOCCUS AUREUS  Blood Culture ID Panel (Reflexed)     Status: Abnormal   Collection Time: 08/17/16  9:05 PM  Result Value Ref Range Status   Enterococcus species NOT DETECTED NOT DETECTED Final   Listeria monocytogenes NOT DETECTED NOT DETECTED Final   Staphylococcus species DETECTED (A) NOT DETECTED Final    Comment: CRITICAL RESULT CALLED TO, READ BACK BY AND VERIFIED WITH: Mila Merry.D. 13:00 08/18/16 ([email or web address])    Staphylococcus aureus DETECTED (A) NOT DETECTED Final    Comment: Methicillin (oxacillin) susceptible Staphylococcus aureus (MSSA). Preferred therapy is anti staphylococcal beta lactam antibiotic (Cefazolin or Nafcillin),  unless clinically contraindicated. CRITICAL RESULT CALLED TO, READ BACK BY AND VERIFIED WITH: Christean Grief Pharm.D. 13:00 08/18/16 ([email or web address])    Methicillin resistance NOT DETECTED NOT DETECTED Final   Streptococcus species NOT DETECTED NOT DETECTED Final   Streptococcus agalactiae NOT DETECTED NOT DETECTED Final   Streptococcus pneumoniae NOT DETECTED NOT DETECTED Final   Streptococcus pyogenes  NOT DETECTED NOT DETECTED Final   Acinetobacter baumannii NOT DETECTED NOT DETECTED Final   Enterobacteriaceae species NOT DETECTED NOT DETECTED Final   Enterobacter cloacae complex NOT DETECTED NOT DETECTED Final   Escherichia coli NOT DETECTED NOT DETECTED Final   Klebsiella oxytoca NOT DETECTED NOT DETECTED Final   Klebsiella pneumoniae NOT DETECTED NOT DETECTED Final   Proteus species NOT DETECTED NOT DETECTED Final   Serratia marcescens NOT DETECTED NOT DETECTED Final   Haemophilus influenzae NOT DETECTED NOT DETECTED Final   Neisseria meningitidis NOT DETECTED NOT DETECTED Final   Pseudomonas aeruginosa NOT DETECTED NOT DETECTED Final   Candida albicans NOT DETECTED NOT DETECTED Final   Candida glabrata NOT DETECTED NOT DETECTED Final   Candida krusei NOT DETECTED NOT DETECTED Final   Candida parapsilosis NOT DETECTED NOT DETECTED Final   Candida tropicalis NOT DETECTED NOT DETECTED Final    Comment: Performed at [facility], [place] [address], [place], [place] [address]  Urine culture     Status: Abnormal   Collection Time: 08/18/16 12:52 AM  Result Value Ref Range Status   Specimen Description URINE, CLEAN CATCH  Final   Special Requests NONE  Final   Culture (A)  Final    50,000 COLONIES/mL STAPHYLOCOCCUS SPECIES (COAGULASE NEGATIVE)   Report Status 08/21/2016 FINAL  Final   Organism ID, Bacteria STAPHYLOCOCCUS SPECIES (COAGULASE NEGATIVE) (A)  Final      Susceptibility   Staphylococcus species (coagulase negative) - MIC*    CIPROFLOXACIN <=0.5 SENSITIVE Sensitive     GENTAMICIN  <=0.5 SENSITIVE Sensitive     NITROFURANTOIN <=16 SENSITIVE Sensitive     OXACILLIN >=4 RESISTANT Resistant     TETRACYCLINE 2 SENSITIVE Sensitive     VANCOMYCIN 2 SENSITIVE Sensitive     TRIMETH/SULFA <=10 SENSITIVE Sensitive     CLINDAMYCIN >=8 RESISTANT Resistant     RIFAMPIN <=0.5 SENSITIVE Sensitive     Inducible Clindamycin NEGATIVE Sensitive     * 50,000 COLONIES/mL STAPHYLOCOCCUS SPECIES (COAGULASE NEGATIVE)  MRSA PCR Screening     Status: None   Collection Time: 08/18/16  2:00 AM  Result Value Ref Range Status   MRSA by PCR NEGATIVE NEGATIVE Final    Comment:        The GeneXpert MRSA Assay (FDA approved for NASAL specimens only), is one component of a comprehensive MRSA colonization surveillance program. It is not intended to diagnose MRSA infection nor to guide or monitor treatment for MRSA infections.   Culture, blood (Routine X 2) w Reflex to ID Panel     Status: None (Preliminary result)   Collection Time: 08/19/16  7:56 AM  Result Value Ref Range Status   Specimen Description BLOOD RIGHT ANTECUBITAL  Final   Special Requests BOTTLES DRAWN AEROBIC AND ANAEROBIC 10CC  Final   Culture   Final    NO GROWTH 1 DAY Performed at [facility], [place] [address], [place], [place] [address]    Report Status PENDING  Incomplete  Culture, blood (Routine X 2) w Reflex to ID Panel     Status: Abnormal   Collection Time: 08/19/16  8:01 AM  Result Value Ref Range Status   Specimen Description BLOOD RIGHT HAND  Final   Special Requests IN PEDIATRIC BOTTLE 3CC  Final   Culture  Setup Time   Final    GRAM POSITIVE COCCI IN CLUSTERS AEROBIC BOTTLE ONLY CRITICAL VALUE NOTED.  VALUE IS CONSISTENT WITH PREVIOUSLY REPORTED AND CALLED VALUE.  Culture (A)  Final    STAPHYLOCOCCUS AUREUS SUSCEPTIBILITIES PERFORMED ON PREVIOUS CULTURE WITHIN THE LAST 5 DAYS. Performed at [facility], [place] [address], [place], [place] [address]    Report Status 08/21/2016 FINAL  Final      Invalid input(s): PROCALCITONIN, LACTICACIDVEN   Radiology Studies: No results found.      Scheduled Meds: .  ceFAZolin (ANCEF) IV  2 g Intravenous Q8H  . mometasone-formoterol  2 puff Inhalation BID  . pantoprazole  40 mg Oral Daily  . warfarin  7.5 mg Oral ONCE-1800  . Warfarin - Pharmacist Dosing Inpatient   Does not apply q1800  . zolpidem  10 mg Oral QHS   Continuous Infusions:    LOS: 3 days    Time spent: 35 minutes    Timmy Bubeck A, MD [facility] Pager [phone]  If 7PM-7AM, please contact night-coverage [email or web address] Password [facility] 08/21/2016, 10:28 AM

## 2016-08-21 NOTE — Progress Notes (Addendum)
Pharmacy Antibiotic Note  Jeffery Whitehead is a 61 y.o. male admitted on 08/17/2016 with sepsis.  Pharmacy has been consulted for Vancomycin and Zosyn dosing. Blood cultures grew MSSA and antibiotics de-escalated to cefazolin.  ID following.    Today, 08/21/2016 Day #4 of cefazolin but 2/16 BCx with GPC 1/2 so wait to start day #1 antibiotics on day Bcx drawn that has NGTD  Renal: SCr WNL  WBC WNL  afebrile  Repeat BCx pending 2/17, 2/16 BCx with GPC clusters 1/2 sets (1/4 bottles)  Will need TEE since TTE did not reveal vegetation  Urine cx: 50K MR-CoNS - suspect colonization  Plan:  Continue cefazolin 2gm IV q8h  Do not anticipate need renal dose adjustment, will follow at distance  Addendum: realized pharmacy not officially consulted for cefazolin dosing.  Will follow peripherally with warfarin dosing  Height: 6' (182.9 cm) Weight: 255 lb (115.7 kg) IBW/kg (Calculated) : 77.6  Temp (24hrs), Avg:98.2 F (36.8 C), Min:97.8 F (36.6 C), Max:98.4 F (36.9 C)   Recent Labs Lab 08/17/16 2104 08/17/16 2117 08/18/16 0101 08/18/16 0351 08/19/16 0403 08/20/16 0535 08/21/16 0607  WBC 12.4*  --   --  11.3* 4.0 4.4 4.6  CREATININE 1.00  --   --  1.09 0.99 0.78 0.79  LATICACIDVEN  --  1.08 1.70  --   --   --   --     Estimated Creatinine Clearance: 127.3 mL/min (by C-G formula based on SCr of 0.79 mg/dL).    No Known Allergies  Antimicrobials this admission:  2/15 Vanc >> 2/15 2/15 Zosyn >> 2/15 2/15 Ancef >> 2/15 Tamiflu >> 2/16  Dose adjustments this admission:   Microbiology results:  2/14 BCx: MSSA 2/15 UCx: 50K MR-CoNS 2/15 MRSA PCR: neg 2/15 Influenza panel: neg 2/16 BCx: GPC clusters 1/2 2/17 BCx:   Thank you for allowing pharmacy to be a part of this patient's care.  Doreene Eland, PharmD, BCPS.   Pager: [phone] 08/21/2016 9:39 AM

## 2016-08-21 NOTE — Progress Notes (Signed)
Patient placed on CPAP without complications. RT will continue to monitor as needed.  

## 2016-08-22 DIAGNOSIS — M545 Low back pain: Secondary | ICD-10-CM

## 2016-08-22 DIAGNOSIS — M549 Dorsalgia, unspecified: Secondary | ICD-10-CM | POA: Insufficient documentation

## 2016-08-22 LAB — BASIC METABOLIC PANEL
ANION GAP: 10 (ref 5–15)
BUN: 21 mg/dL — AB (ref 6–20)
CHLORIDE: 104 mmol/L (ref 101–111)
CO2: 22 mmol/L (ref 22–32)
Calcium: 8.4 mg/dL — ABNORMAL LOW (ref 8.9–10.3)
Creatinine, Ser: 0.71 mg/dL (ref 0.61–1.24)
Glucose, Bld: 99 mg/dL (ref 65–99)
POTASSIUM: 3.4 mmol/L — AB (ref 3.5–5.1)
SODIUM: 136 mmol/L (ref 135–145)

## 2016-08-22 LAB — PROTIME-INR
INR: 3.53
Prothrombin Time: 36.2 seconds — ABNORMAL HIGH (ref 11.4–15.2)

## 2016-08-22 LAB — PROCALCITONIN: Procalcitonin: 0.39 ng/mL

## 2016-08-22 MED ORDER — POTASSIUM CHLORIDE CRYS ER 20 MEQ PO TBCR
40.0000 meq | EXTENDED_RELEASE_TABLET | Freq: Four times a day (QID) | ORAL | Status: AC
  Administered 2016-08-22 (×2): 40 meq via ORAL
  Filled 2016-08-22 (×2): qty 2

## 2016-08-22 NOTE — Progress Notes (Signed)
[facility]  Date of Admission:  08/17/2016           Day 5 cefazolin  Principal Problem:   Staphylococcus aureus bacteremia with sepsis ([place]) Active Problems:   S/P AVR (aortic valve replacement)   OSA (obstructive sleep apnea)   Hypokalemia   Elevated LFTs   Asthma, chronic, moderate persistent, uncomplicated   Normocytic anemia   Thrombocytopenia (HCC)   HTN (hypertension)   .  ceFAZolin (ANCEF) IV  2 g Intravenous Q8H  . clonazePAM  0.25 mg Oral QHS  . mometasone-formoterol  2 puff Inhalation BID  . pantoprazole  40 mg Oral Daily  . potassium chloride  40 mEq Oral Q6H  . Warfarin - Pharmacist Dosing Inpatient   Does not apply q1800  . zolpidem  10 mg Oral QHS    SUBJECTIVE: He is now having severe right lower back pain. It began after he was admitted. He states that when the Toradol wears off his pain is at least 8 out of 10. He has not had any recent injury to his back that he is aware of.  Review of Systems: Review of Systems  Constitutional: Positive for diaphoresis and malaise/fatigue. Negative for chills, fever and weight loss.  HENT: Negative for sore throat.   Respiratory: Positive for cough. Negative for sputum production and shortness of breath.   Cardiovascular: Negative for chest pain.  Gastrointestinal: Negative for abdominal pain, diarrhea, heartburn, nausea and vomiting.  Genitourinary: Negative for dysuria and frequency.  Musculoskeletal: Positive for back pain. Negative for joint pain and myalgias.  Skin: Negative for rash.  Neurological: Negative for dizziness and headaches.    Past Medical History:  Diagnosis Date  . Cold   . Decreased anal sphincter tone   . Heart valve replaced    1983   [facility]  . Insomnia   . Shortness of breath dyspnea   . Sleep apnea    cpap  . Varicose vein of leg   . Varicose veins     Social History  Substance Use Topics  . Smoking status: Never Smoker  . Smokeless  tobacco: Never Used  . Alcohol use 0.0 oz/week     Comment: wine at night    Family History  Problem Relation Age of Onset  . Heart disease Father    No Known Allergies  OBJECTIVE: Vitals:   08/21/16 2043 08/22/16 0426 08/22/16 0940 08/22/16 1300  BP: 135/87 (!) 147/80  (!) 136/95  Pulse: 64 76  76  Resp: 18 18  18   Temp: 98.2 F (36.8 C) 98.4 F (36.9 C)  98.5 F (36.9 C)  TempSrc: Oral Oral  Oral  SpO2: 98% 97% 97% 99%  Weight:      Height:       Body mass index is 34.58 kg/m.  Physical Exam  Constitutional: He is oriented to person, place, and time.  He appears uncomfortable due to pain.  HENT:  Mouth/Throat: No oropharyngeal exudate.  Eyes: Conjunctivae are normal.  Cardiovascular: Normal rate and regular rhythm.   No murmur heard. Pulmonary/Chest: Effort normal and breath sounds normal. He has no wheezes. He has no rales.  He has a frequent dry cough during the exam.  Abdominal: Soft. He exhibits no mass. There is no tenderness.  Musculoskeletal: Normal range of motion.  Neurological: He is alert and oriented to person, place, and time.  Skin: No rash noted.  Psychiatric: Mood and affect  normal.    Lab Results Lab Results  Component Value Date   WBC 4.6 08/21/2016   HGB 11.0 (L) 08/21/2016   HCT 32.5 (L) 08/21/2016   MCV 84.4 08/21/2016   PLT 108 (L) 08/21/2016    Lab Results  Component Value Date   CREATININE 0.71 08/22/2016   BUN 21 (H) 08/22/2016   NA 136 08/22/2016   K 3.4 (L) 08/22/2016   CL 104 08/22/2016   CO2 22 08/22/2016    Lab Results  Component Value Date   ALT 69 (H) 08/20/2016   AST 68 (H) 08/20/2016   ALKPHOS 109 08/20/2016   BILITOT 1.3 (H) 08/20/2016     Microbiology: Recent Results (from the past 240 hour(s))  Culture, blood (Routine x 2)     Status: Abnormal   Collection Time: 08/17/16  9:05 PM  Result Value Ref Range Status   Specimen Description BLOOD RIGHT ANTECUBITAL  Final   Special Requests BOTTLES DRAWN  AEROBIC AND ANAEROBIC 5 CC  Final   Culture  Setup Time   Final    GRAM POSITIVE COCCI IN CLUSTERS IN BOTH AEROBIC AND ANAEROBIC BOTTLES CRITICAL RESULT CALLED TO, READ BACK BY AND VERIFIED WITH: Christean Grief Pharm.D. 13:00 08/18/16  ([email or web address])    Culture (A)  Final    STAPHYLOCOCCUS AUREUS SUSCEPTIBILITIES PERFORMED ON PREVIOUS CULTURE WITHIN THE LAST 5 DAYS. Performed at [facility], [place] [address], [place], [place] [address]    Report Status 08/20/2016 FINAL  Final  Culture, blood (Routine x 2)     Status: Abnormal   Collection Time: 08/17/16  9:05 PM  Result Value Ref Range Status   Specimen Description BLOOD LEFT HAND  Final   Special Requests BOTTLES DRAWN AEROBIC AND ANAEROBIC 5 CC  Final   Culture  Setup Time   Final    GRAM POSITIVE COCCI IN CLUSTERS IN BOTH AEROBIC AND ANAEROBIC BOTTLES CRITICAL VALUE NOTED.  VALUE IS CONSISTENT WITH PREVIOUSLY REPORTED AND CALLED VALUE. Performed at [facility], [place] [address], [place], [place] [address]    Culture STAPHYLOCOCCUS AUREUS (A)  Final   Report Status 08/20/2016 FINAL  Final   Organism ID, Bacteria STAPHYLOCOCCUS AUREUS  Final      Susceptibility   Staphylococcus aureus - MIC*    CIPROFLOXACIN <=0.5 SENSITIVE Sensitive     ERYTHROMYCIN 0.5 SENSITIVE Sensitive     GENTAMICIN <=0.5 SENSITIVE Sensitive     OXACILLIN 0.5 SENSITIVE Sensitive     TETRACYCLINE <=1 SENSITIVE Sensitive     VANCOMYCIN 1 SENSITIVE Sensitive     TRIMETH/SULFA <=10 SENSITIVE Sensitive     CLINDAMYCIN <=0.25 SENSITIVE Sensitive     RIFAMPIN <=0.5 SENSITIVE Sensitive     Inducible Clindamycin NEGATIVE Sensitive     * STAPHYLOCOCCUS AUREUS  Blood Culture ID Panel (Reflexed)     Status: Abnormal   Collection Time: 08/17/16  9:05 PM  Result Value Ref Range Status   Enterococcus species NOT DETECTED NOT DETECTED Final   Listeria monocytogenes NOT DETECTED NOT DETECTED Final   Staphylococcus species DETECTED (A) NOT DETECTED Final    Comment:  CRITICAL RESULT CALLED TO, READ BACK BY AND VERIFIED WITH: Mila Merry.D. 13:00 08/18/16 ([email or web address])    Staphylococcus aureus DETECTED (A) NOT DETECTED Final    Comment: Methicillin (oxacillin) susceptible Staphylococcus aureus (MSSA). Preferred therapy is anti staphylococcal beta lactam antibiotic (Cefazolin or Nafcillin), unless clinically contraindicated. CRITICAL RESULT CALLED TO, READ BACK BY AND VERIFIED WITH: Christean Grief Pharm.D.  13:00 08/18/16 ([email or web address])    Methicillin resistance NOT DETECTED NOT DETECTED Final   Streptococcus species NOT DETECTED NOT DETECTED Final   Streptococcus agalactiae NOT DETECTED NOT DETECTED Final   Streptococcus pneumoniae NOT DETECTED NOT DETECTED Final   Streptococcus pyogenes NOT DETECTED NOT DETECTED Final   Acinetobacter baumannii NOT DETECTED NOT DETECTED Final   Enterobacteriaceae species NOT DETECTED NOT DETECTED Final   Enterobacter cloacae complex NOT DETECTED NOT DETECTED Final   Escherichia coli NOT DETECTED NOT DETECTED Final   Klebsiella oxytoca NOT DETECTED NOT DETECTED Final   Klebsiella pneumoniae NOT DETECTED NOT DETECTED Final   Proteus species NOT DETECTED NOT DETECTED Final   Serratia marcescens NOT DETECTED NOT DETECTED Final   Haemophilus influenzae NOT DETECTED NOT DETECTED Final   Neisseria meningitidis NOT DETECTED NOT DETECTED Final   Pseudomonas aeruginosa NOT DETECTED NOT DETECTED Final   Candida albicans NOT DETECTED NOT DETECTED Final   Candida glabrata NOT DETECTED NOT DETECTED Final   Candida krusei NOT DETECTED NOT DETECTED Final   Candida parapsilosis NOT DETECTED NOT DETECTED Final   Candida tropicalis NOT DETECTED NOT DETECTED Final    Comment: Performed at [facility], [place]. [address], [place], [place] [address]  Urine culture     Status: Abnormal   Collection Time: 08/18/16 12:52 AM  Result Value Ref Range Status   Specimen Description URINE, CLEAN CATCH  Final   Special Requests NONE  Final   Culture (A)   Final    50,000 COLONIES/mL STAPHYLOCOCCUS SPECIES (COAGULASE NEGATIVE)   Report Status 08/21/2016 FINAL  Final   Organism ID, Bacteria STAPHYLOCOCCUS SPECIES (COAGULASE NEGATIVE) (A)  Final      Susceptibility   Staphylococcus species (coagulase negative) - MIC*    CIPROFLOXACIN <=0.5 SENSITIVE Sensitive     GENTAMICIN <=0.5 SENSITIVE Sensitive     NITROFURANTOIN <=16 SENSITIVE Sensitive     OXACILLIN >=4 RESISTANT Resistant     TETRACYCLINE 2 SENSITIVE Sensitive     VANCOMYCIN 2 SENSITIVE Sensitive     TRIMETH/SULFA <=10 SENSITIVE Sensitive     CLINDAMYCIN >=8 RESISTANT Resistant     RIFAMPIN <=0.5 SENSITIVE Sensitive     Inducible Clindamycin NEGATIVE Sensitive     * 50,000 COLONIES/mL STAPHYLOCOCCUS SPECIES (COAGULASE NEGATIVE)  MRSA PCR Screening     Status: None   Collection Time: 08/18/16  2:00 AM  Result Value Ref Range Status   MRSA by PCR NEGATIVE NEGATIVE Final    Comment:        The GeneXpert MRSA Assay (FDA approved for NASAL specimens only), is one component of a comprehensive MRSA colonization surveillance program. It is not intended to diagnose MRSA infection nor to guide or monitor treatment for MRSA infections.   Culture, blood (Routine X 2) w Reflex to ID Panel     Status: None (Preliminary result)   Collection Time: 08/19/16  7:56 AM  Result Value Ref Range Status   Specimen Description BLOOD RIGHT ANTECUBITAL  Final   Special Requests BOTTLES DRAWN AEROBIC AND ANAEROBIC 10CC  Final   Culture   Final    NO GROWTH 3 DAYS Performed at [facility], [place] [address], [place], [place] [address]    Report Status PENDING  Incomplete  Culture, blood (Routine X 2) w Reflex to ID Panel     Status: Abnormal   Collection Time: 08/19/16  8:01 AM  Result Value Ref Range Status   Specimen Description BLOOD RIGHT HAND  Final  Special Requests IN PEDIATRIC BOTTLE 3CC  Final   Culture  Setup Time   Final    GRAM POSITIVE COCCI IN CLUSTERS AEROBIC BOTTLE  ONLY CRITICAL VALUE NOTED.  VALUE IS CONSISTENT WITH PREVIOUSLY REPORTED AND CALLED VALUE.    Culture (A)  Final    STAPHYLOCOCCUS AUREUS SUSCEPTIBILITIES PERFORMED ON PREVIOUS CULTURE WITHIN THE LAST 5 DAYS. Performed at [facility], [place] [address], [place], [place] [address]    Report Status 08/21/2016 FINAL  Final  Culture, blood (Routine X 2) w Reflex to ID Panel     Status: None (Preliminary result)   Collection Time: 08/20/16 10:30 AM  Result Value Ref Range Status   Specimen Description BLOOD RIGHT ARM  8 ML IN [facility] BOTTLE  Final   Special Requests Immunocompromised  Final   Culture   Final    NO GROWTH 2 DAYS Performed at [facility], [place] [address], [place], [place] [address]    Report Status PENDING  Incomplete  Culture, blood (Routine X 2) w Reflex to ID Panel     Status: None (Preliminary result)   Collection Time: 08/20/16 10:30 AM  Result Value Ref Range Status   Specimen Description BLOOD RIGHT HAND  3 ML IN YELLOW  Final   Special Requests Immunocompromised  Final   Culture   Final    NO GROWTH 2 DAYS Performed at [facility], [place] [address] [place], [place] [address]    Report Status PENDING  Incomplete     ASSESSMENT: With the onset of severe back pain I am certainly concerned about early lumbar infection due to his MSSA bacteremia. One of 2 repeat blood cultures on 08/19/2016 grew MSSA again. Blood cultures were repeated on 08/20/2013 and are pending. If they remain negative tomorrow we can proceed with PICC placement.  PLAN: 1. Continue cefazolin  2. Await results of repeat blood culture 3. Await results of lumbar MRI 4. Await results of transesophageal echocardiogram  Michel Bickers, MD [facility] [place] [phone] pager   [phone] cell 08/22/2016, 4:28 PM

## 2016-08-22 NOTE — Progress Notes (Signed)
PROGRESS NOTE  Jeffery Whitehead  [id number] DOB: [birth year] DOA: 08/17/2016 PCP: Charolette Forward, PA-C Outpatient Specialists:  Subjective: Continues to have lower back pain, will check if he can have MRI with his back if not CT scan. TEE scheduled for Wednesday, continue cefazolin. ID to advise about the timing of placing PICC line.  Brief Narrative:  Jeffery Whitehead is a 61 y.o. 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 The patient was in his usual state of health until a few days ago when he developed URI symptoms of sinus congestion, fever.  Over the last two days this has progressed to generalized malaise, body aches, fever, chills, sore throat and headache.  Today, his wife called home in the evening to check on him and he seemed confused and slurred to her so she called 9-1-1.  EMS gave 1500 IVF en route and found him with temp 104F.  There has been no cough, sputum production, neck stiffness.  There has been no dysuria, urinary symptoms.  On arrival, he was somewhat altered, moderate hypotension.  Assessment & Plan:   Principal Problem:   Staphylococcus aureus bacteremia with sepsis ([place]) Active Problems:   OSA (obstructive sleep apnea)   S/P AVR (aortic valve replacement)   Hypokalemia   Elevated LFTs   Asthma, chronic, moderate persistent, uncomplicated   Normocytic anemia   Thrombocytopenia (HCC)   HTN (hypertension)   Sepsis. Presented with temperature of 101.3, respiratory rate of 33 and presence of bacteremia. Patient given IV fluids, blood pressure was soft on admission. Started on broad-spectrum antibiotics and Tamiflu. Sepsis physiology resolved, continue current antibiotics. Cultures repeated on 2/16 NGTD  MSSA bacteremia Unclear source, patient has St. Jude aortic mechanical valve, will obtain TTE. No evidence of pneumonia or other  potential sources like skin ulcers, patient mentioned he has some sinus problems. 2-D echo showed no evidence of vegetations on his mechanical valve, will obtain a TEE. TEE to be done on Wednesday, lumbar MRI can be done, PICC line with it's okay with ID.  Acute encephalopathy Likely acute metabolic encephalopathy secondary to sepsis. This is resolved patient is back to his baseline.  Hypokalemia:  Presented with potassium of 2.9, repleted with oral supplements.  Elevated LFTs:  Possibly related to sepsis Ultrasound showed fatty infiltration of his liver.  OSA on CPAP:  Continue home CPAP  St. Jude AVR:  Continue warfarin, dosed per pharmacy, INR is 2.1 today.  check 2-D echo to rule out any vegetation in the presence of MSSA bacteremia  Anemia:  This is new. No evidence of bleeding. Trend hemoglobin  Asthma: -Scant wheezing with exam. -Continue Symbicort -Albuterol when necessary  Hypertension: -Hypotensive at admission. -Hold lisinopril until hemodynamics clearer.  Thrombocytopenia Platelets was 143 on admission, went down to 82, this is likely secondary to sepsis. Check in a.m.   DVT prophylaxis: On full anticoagulation with warfarin.  Code Status: Full Code Family Communication:  Disposition Plan:  Diet: Diet Heart Room service appropriate? Yes; Fluid consistency: Thin  Consultants:   None  Procedures:   None  Antimicrobials:   None   Objective: Vitals:   08/21/16 1958 08/21/16 2043 08/22/16 0426 08/22/16 0940  BP:  135/87 (!) 147/80   Pulse:  64 76   Resp:  18 18   Temp:  98.2 F (36.8 C) 98.4 F (36.9 C)   TempSrc:  Oral Oral   SpO2: 98% 98% 97% 97%  Weight:      Height:        Intake/Output Summary (Last 24 hours) at 08/22/16 1212 Last data filed at 08/22/16 0417  Gross per 24 hour  Intake              440 ml  Output              300 ml  Net              140 ml   Filed Weights   08/17/16 2048  Weight: 115.7 kg (255 lb)      Examination: General exam: Appears calm and comfortable  Respiratory system: Clear to auscultation. Respiratory effort normal. Cardiovascular system: S1 & S2 heard, RRR. No JVD, murmurs, rubs, gallops or clicks. No pedal edema. Gastrointestinal system: Abdomen is nondistended, soft and nontender. No organomegaly or masses felt. Normal bowel sounds heard. Central nervous system: Alert and oriented. No focal neurological deficits. Extremities: Symmetric 5 x 5 power. Skin: No rashes, lesions or ulcers Psychiatry: Judgement and insight appear normal. Mood & affect appropriate.   Data Reviewed: I have personally reviewed following labs and imaging studies  CBC:  Recent Labs Lab 08/17/16 2104 08/18/16 0351 08/19/16 0403 08/20/16 0535 08/21/16 0607  WBC 12.4* 11.3* 4.0 4.4 4.6  NEUTROABS 11.9*  --   --   --   --   HGB 11.5* 12.0* 11.2* 11.3* 11.0*  HCT 34.8* 36.1* 34.3* 33.7* 32.5*  MCV 84.1 86.0 86.4 84.3 84.4  PLT 143* 142* 82* 97* 123XX123*   Basic Metabolic Panel:  Recent Labs Lab 08/18/16 0351 08/19/16 0403 08/20/16 0535 08/21/16 0607 08/22/16 0455  NA 136 136 136 138 136  K 3.4* 3.5 3.6 3.6 3.4*  CL 106 109 105 107 104  CO2 23 22 26 24 22   GLUCOSE 111* 129* 114* 111* 99  BUN 21* 14 11 22* 21*  CREATININE 1.09 0.99 0.78 0.79 0.71  CALCIUM 7.9* 7.9* 8.4* 8.7* 8.4*   GFR: Estimated Creatinine Clearance: 127.3 mL/min (by C-G formula based on SCr of 0.71 mg/dL). Liver Function Tests:  Recent Labs Lab 08/17/16 2104 08/18/16 0351 08/20/16 0535  AST 72* 100* 68*  ALT 59 77* 69*  ALKPHOS 62 63 109  BILITOT 1.6* 1.8* 1.3*  PROT 6.4* 6.4* 6.2*  ALBUMIN 3.8 3.5 3.1*   No results for input(s): LIPASE, AMYLASE in the last 168 hours. No results for input(s): AMMONIA in the last 168 hours. Coagulation Profile:  Recent Labs Lab 08/18/16 0351 08/19/16 0403 08/20/16 0535 08/21/16 0607 08/22/16 0455  INR 2.34 2.18 2.36 2.68 3.53   Cardiac Enzymes: No results  for input(s): CKTOTAL, CKMB, CKMBINDEX, TROPONINI in the last 168 hours. BNP (last 3 results) No results for input(s): PROBNP in the last 8760 hours. HbA1C: No results for input(s): HGBA1C in the last 72 hours. CBG: No results for input(s): GLUCAP in the last 168 hours. Lipid Profile: No results for input(s): CHOL, HDL, LDLCALC, TRIG, CHOLHDL, LDLDIRECT in the last 72 hours. Thyroid Function Tests: No results for input(s): TSH, T4TOTAL, FREET4, T3FREE, THYROIDAB in the last 72 hours. Anemia Panel: No results for input(s): VITAMINB12, FOLATE, FERRITIN, TIBC, IRON, RETICCTPCT in the last 72 hours. Urine analysis:    Component Value Date/Time   COLORURINE YELLOW 08/18/2016 0044   APPEARANCEUR HAZY (A) 08/18/2016 0044   LABSPEC 1.019 08/18/2016 0044   PHURINE 5.0 08/18/2016 0044   GLUCOSEU NEGATIVE 08/18/2016 0044  HGBUR MODERATE (A) 08/18/2016 0044   BILIRUBINUR NEGATIVE 08/18/2016 [employer] 08/18/2016 0044   PROTEINUR 30 (A) 08/18/2016 0044   NITRITE NEGATIVE 08/18/2016 0044   LEUKOCYTESUR NEGATIVE 08/18/2016 0044   Sepsis Labs: @LABRCNTIP (procalcitonin:4,lacticidven:4)  ) Recent Results (from the past 240 hour(s))  Culture, blood (Routine x 2)     Status: Abnormal   Collection Time: 08/17/16  9:05 PM  Result Value Ref Range Status   Specimen Description BLOOD RIGHT ANTECUBITAL  Final   Special Requests BOTTLES DRAWN AEROBIC AND ANAEROBIC 5 CC  Final   Culture  Setup Time   Final    GRAM POSITIVE COCCI IN CLUSTERS IN BOTH AEROBIC AND ANAEROBIC BOTTLES CRITICAL RESULT CALLED TO, READ BACK BY AND VERIFIED WITH: Christean Grief Pharm.D. 13:00 08/18/16  ([email or web address])    Culture (A)  Final    STAPHYLOCOCCUS AUREUS SUSCEPTIBILITIES PERFORMED ON PREVIOUS CULTURE WITHIN THE LAST 5 DAYS. Performed at [facility], [place] [address] [place], [place] [address]    Report Status 08/20/2016 FINAL  Final  Culture, blood (Routine x 2)     Status: Abnormal   Collection Time:  08/17/16  9:05 PM  Result Value Ref Range Status   Specimen Description BLOOD LEFT HAND  Final   Special Requests BOTTLES DRAWN AEROBIC AND ANAEROBIC 5 CC  Final   Culture  Setup Time   Final    GRAM POSITIVE COCCI IN CLUSTERS IN BOTH AEROBIC AND ANAEROBIC BOTTLES CRITICAL VALUE NOTED.  VALUE IS CONSISTENT WITH PREVIOUSLY REPORTED AND CALLED VALUE. Performed at [facility], [place] [address], [place],  [address]    Culture STAPHYLOCOCCUS AUREUS (A)  Final   Report Status 08/20/2016 FINAL  Final   Organism ID, Bacteria STAPHYLOCOCCUS AUREUS  Final      Susceptibility   Staphylococcus aureus - MIC*    CIPROFLOXACIN <=0.5 SENSITIVE Sensitive     ERYTHROMYCIN 0.5 SENSITIVE Sensitive     GENTAMICIN <=0.5 SENSITIVE Sensitive     OXACILLIN 0.5 SENSITIVE Sensitive     TETRACYCLINE <=1 SENSITIVE Sensitive     VANCOMYCIN 1 SENSITIVE Sensitive     TRIMETH/SULFA <=10 SENSITIVE Sensitive     CLINDAMYCIN <=0.25 SENSITIVE Sensitive     RIFAMPIN <=0.5 SENSITIVE Sensitive     Inducible Clindamycin NEGATIVE Sensitive     * STAPHYLOCOCCUS AUREUS  Blood Culture ID Panel (Reflexed)     Status: Abnormal   Collection Time: 08/17/16  9:05 PM  Result Value Ref Range Status   Enterococcus species NOT DETECTED NOT DETECTED Final   Listeria monocytogenes NOT DETECTED NOT DETECTED Final   Staphylococcus species DETECTED (A) NOT DETECTED Final    Comment: CRITICAL RESULT CALLED TO, READ BACK BY AND VERIFIED WITH: Mila Merry.D. 13:00 08/18/16 ([email or web address])    Staphylococcus aureus DETECTED (A) NOT DETECTED Final    Comment: Methicillin (oxacillin) susceptible Staphylococcus aureus (MSSA). Preferred therapy is anti staphylococcal beta lactam antibiotic (Cefazolin or Nafcillin), unless clinically contraindicated. CRITICAL RESULT CALLED TO, READ BACK BY AND VERIFIED WITH: Christean Grief Pharm.D. 13:00 08/18/16 ([email or web address])    Methicillin resistance NOT DETECTED NOT DETECTED Final   Streptococcus species NOT  DETECTED NOT DETECTED Final   Streptococcus agalactiae NOT DETECTED NOT DETECTED Final   Streptococcus pneumoniae NOT DETECTED NOT DETECTED Final   Streptococcus pyogenes NOT DETECTED NOT DETECTED Final   Acinetobacter baumannii NOT DETECTED NOT DETECTED Final   Enterobacteriaceae species NOT DETECTED NOT DETECTED Final   Enterobacter cloacae complex NOT DETECTED NOT  DETECTED Final   Escherichia coli NOT DETECTED NOT DETECTED Final   Klebsiella oxytoca NOT DETECTED NOT DETECTED Final   Klebsiella pneumoniae NOT DETECTED NOT DETECTED Final   Proteus species NOT DETECTED NOT DETECTED Final   Serratia marcescens NOT DETECTED NOT DETECTED Final   Haemophilus influenzae NOT DETECTED NOT DETECTED Final   Neisseria meningitidis NOT DETECTED NOT DETECTED Final   Pseudomonas aeruginosa NOT DETECTED NOT DETECTED Final   Candida albicans NOT DETECTED NOT DETECTED Final   Candida glabrata NOT DETECTED NOT DETECTED Final   Candida krusei NOT DETECTED NOT DETECTED Final   Candida parapsilosis NOT DETECTED NOT DETECTED Final   Candida tropicalis NOT DETECTED NOT DETECTED Final    Comment: Performed at [facility], [place] [address], [place], [place] [address]  Urine culture     Status: Abnormal   Collection Time: 08/18/16 12:52 AM  Result Value Ref Range Status   Specimen Description URINE, CLEAN CATCH  Final   Special Requests NONE  Final   Culture (A)  Final    50,000 COLONIES/mL STAPHYLOCOCCUS SPECIES (COAGULASE NEGATIVE)   Report Status 08/21/2016 FINAL  Final   Organism ID, Bacteria STAPHYLOCOCCUS SPECIES (COAGULASE NEGATIVE) (A)  Final      Susceptibility   Staphylococcus species (coagulase negative) - MIC*    CIPROFLOXACIN <=0.5 SENSITIVE Sensitive     GENTAMICIN <=0.5 SENSITIVE Sensitive     NITROFURANTOIN <=16 SENSITIVE Sensitive     OXACILLIN >=4 RESISTANT Resistant     TETRACYCLINE 2 SENSITIVE Sensitive     VANCOMYCIN 2 SENSITIVE Sensitive     TRIMETH/SULFA <=10 SENSITIVE  Sensitive     CLINDAMYCIN >=8 RESISTANT Resistant     RIFAMPIN <=0.5 SENSITIVE Sensitive     Inducible Clindamycin NEGATIVE Sensitive     * 50,000 COLONIES/mL STAPHYLOCOCCUS SPECIES (COAGULASE NEGATIVE)  MRSA PCR Screening     Status: None   Collection Time: 08/18/16  2:00 AM  Result Value Ref Range Status   MRSA by PCR NEGATIVE NEGATIVE Final    Comment:        The GeneXpert MRSA Assay (FDA approved for NASAL specimens only), is one component of a comprehensive MRSA colonization surveillance program. It is not intended to diagnose MRSA infection nor to guide or monitor treatment for MRSA infections.   Culture, blood (Routine X 2) w Reflex to ID Panel     Status: None (Preliminary result)   Collection Time: 08/19/16  7:56 AM  Result Value Ref Range Status   Specimen Description BLOOD RIGHT ANTECUBITAL  Final   Special Requests BOTTLES DRAWN AEROBIC AND ANAEROBIC 10CC  Final   Culture   Final    NO GROWTH 2 DAYS Performed at [facility], [place] [address] [place], [place] [address]    Report Status PENDING  Incomplete  Culture, blood (Routine X 2) w Reflex to ID Panel     Status: Abnormal   Collection Time: 08/19/16  8:01 AM  Result Value Ref Range Status   Specimen Description BLOOD RIGHT HAND  Final   Special Requests IN PEDIATRIC BOTTLE 3CC  Final   Culture  Setup Time   Final    GRAM POSITIVE COCCI IN CLUSTERS AEROBIC BOTTLE ONLY CRITICAL VALUE NOTED.  VALUE IS CONSISTENT WITH PREVIOUSLY REPORTED AND CALLED VALUE.    Culture (A)  Final    STAPHYLOCOCCUS AUREUS SUSCEPTIBILITIES PERFORMED ON PREVIOUS CULTURE WITHIN THE LAST 5 DAYS. Performed at [facility], [place] [address] [place], [place]  [id number]    Report Status 08/21/2016 FINAL  Final  Culture, blood (Routine X 2) w Reflex to ID Panel     Status: None (Preliminary result)   Collection Time: 08/20/16 10:30 AM  Result Value Ref Range Status   Specimen Description BLOOD RIGHT ARM  8 ML IN [facility] BOTTLE   Final   Special Requests Immunocompromised  Final   Culture   Final    NO GROWTH < 24 HOURS Performed at [facility], [place] [address] [place], [place] [address]    Report Status PENDING  Incomplete  Culture, blood (Routine X 2) w Reflex to ID Panel     Status: None (Preliminary result)   Collection Time: 08/20/16 10:30 AM  Result Value Ref Range Status   Specimen Description BLOOD RIGHT HAND  3 ML IN YELLOW  Final   Special Requests Immunocompromised  Final   Culture   Final    NO GROWTH < 24 HOURS Performed at [facility], [place] [address], [place], [place] [address]    Report Status PENDING  Incomplete     Invalid input(s): PROCALCITONIN, LACTICACIDVEN   Radiology Studies: No results found.      Scheduled Meds: .  ceFAZolin (ANCEF) IV  2 g Intravenous Q8H  . clonazePAM  0.25 mg Oral QHS  . mometasone-formoterol  2 puff Inhalation BID  . pantoprazole  40 mg Oral Daily  . potassium chloride  40 mEq Oral Q6H  . Warfarin - Pharmacist Dosing Inpatient   Does not apply q1800  . zolpidem  10 mg Oral QHS   Continuous Infusions:    LOS: 4 days    Time spent: 35 minutes    Terius Jacuinde A, MD [facility] Pager [phone]  If 7PM-7AM, please contact night-coverage [email or web address] Password [email or web address] 08/22/2016, 12:12 PM

## 2016-08-22 NOTE — Progress Notes (Addendum)
[place] for warfarin Indication: Mechanical Valve St. Jude  No Known Allergies  Patient Measurements: Height: 6' (182.9 cm) Weight: 255 lb (115.7 kg) IBW/kg (Calculated) : 77.6 Heparin Dosing Weight:   Vital Signs: Temp: 98.4 F (36.9 C) (02/19 0426) Temp Source: Oral (02/19 0426) BP: 147/80 (02/19 0426) Pulse Rate: 76 (02/19 0426)  Labs:  Recent Labs  08/20/16 0535 08/21/16 0607 08/22/16 0455  HGB 11.3* 11.0*  --   HCT 33.7* 32.5*  --   PLT 97* 108*  --   LABPROT 26.2* 29.1* 36.2*  INR 2.36 2.68 3.53  CREATININE 0.78 0.79 0.71    Estimated Creatinine Clearance: 127.3 mL/min (by C-G formula based on SCr of 0.71 mg/dL).    Assessment: Patient wth St. Jude valve with goal INR 2.5-3.5 per prior anti-coag clinic notes.   Admitted with ALM and URI symptoms and sepsis.    Home dose warfarin 7.5mg  daily except 8mg  on wednesdays  08/22/2016  INR with sharp rise overnight, now slightly supratherapeutic  CBC: (2/18) Hgb decreased but stable, pltc = 108 (improving)   Diet: regular, eating 100%  Drug-drug interactions: no major interactions.  Started on toradol 30mg  IV q8h prn (requesting ~8-10hr).  Based on age and concomitant warfarin, suggest reducing dose to 15mg  IV q8h PRN and use for shortest time possible.    Goal of Therapy:  INR 2.5-3.5   Plan:   Hold warfarin. Would normally reduce dose given INR only slightly high; however with sharp rise overnight that is not consistent with recent trend, will skip dose altogether today. Note recent sepsis, abx, and skipped meals on 2/16-17, so anticipate reduced warfarin need for a time.  Daily INR; CBC at least q72 hr  Monitor for bleeding  Reuel Boom, PharmD, BCPS Pager: [phone] 08/22/2016, 12:26 PM

## 2016-08-22 NOTE — Procedures (Signed)
Placed patient on CPAP for the night.  Patient is tolerating well at this time. 

## 2016-08-23 ENCOUNTER — Inpatient Hospital Stay (HOSPITAL_COMMUNITY): Payer: 59

## 2016-08-23 LAB — PROTIME-INR
INR: 3.48
PROTHROMBIN TIME: 35.8 s — AB (ref 11.4–15.2)

## 2016-08-23 MED ORDER — TRAMADOL HCL 50 MG PO TABS
50.0000 mg | ORAL_TABLET | Freq: Three times a day (TID) | ORAL | Status: DC | PRN
  Administered 2016-08-23: 50 mg via ORAL
  Filled 2016-08-23: qty 1

## 2016-08-23 MED ORDER — WARFARIN SODIUM 2.5 MG PO TABS
2.5000 mg | ORAL_TABLET | Freq: Once | ORAL | Status: AC
  Administered 2016-08-23: 2.5 mg via ORAL
  Filled 2016-08-23: qty 1

## 2016-08-23 MED ORDER — GADOBENATE DIMEGLUMINE 529 MG/ML IV SOLN
20.0000 mL | Freq: Once | INTRAVENOUS | Status: AC | PRN
  Administered 2016-08-23: 20 mL via INTRAVENOUS

## 2016-08-23 MED ORDER — HYDROCODONE-ACETAMINOPHEN 5-325 MG PO TABS
1.0000 | ORAL_TABLET | Freq: Four times a day (QID) | ORAL | Status: DC | PRN
  Administered 2016-08-23: 1 via ORAL
  Administered 2016-08-24: 2 via ORAL
  Administered 2016-08-25: 1 via ORAL
  Filled 2016-08-23 (×5): qty 1

## 2016-08-23 MED ORDER — MORPHINE SULFATE (PF) 4 MG/ML IV SOLN: 1.0000 mg | INTRAVENOUS | Status: DC | PRN

## 2016-08-23 MED ORDER — METHOCARBAMOL 500 MG PO TABS
500.0000 mg | ORAL_TABLET | Freq: Three times a day (TID) | ORAL | Status: DC | PRN
  Administered 2016-08-23 – 2016-08-25 (×2): 500 mg via ORAL
  Filled 2016-08-23 (×2): qty 1

## 2016-08-23 NOTE — Progress Notes (Signed)
[facility] Coordinator will follow pt with ID team to support home IV ABX at DC if/as ordered.  If patient discharges after hours, please call [phone].   Larry Sierras 08/23/2016, 11:28 AM

## 2016-08-23 NOTE — Progress Notes (Signed)
I was asked to check in on the patient.  Patient was somewhat SOB but in NAD.  No other significant complaints.  Pain was controlled and he did not desire additional pain medication.  CXR ordered and without acute disease.  No further intervention needed at this time.  Carlyon Shadow, M.D.

## 2016-08-23 NOTE — Care Management Note (Signed)
Case Management Note  Patient Details  Name: Jeffery Whitehead MRN: [id number] Date of Birth: [birth year]  Subjective/Objective: for picc today & mri today.tee in am. [facility] already following for ling term iv abx-[facility] infusion nurse-Pam aware, awaiting iv abx manual script, also patient chose [facility] for HHRN-instruction-rep [facility] able to accept & following for HHRN,[id number] order.                   Action/Plan:d/c home w/HHC.   Expected Discharge Date:                  Expected Discharge Plan:  [place]  In-House Referral:     Discharge planning Services  CM Consult  Post Acute Care Choice:  Home Health Choice offered to:  Patient  DME Arranged:    DME Agency:     HH Arranged:    [place] Agency:  [facility]  Status of Service:  In process, will continue to follow  If discussed at Long Length of Stay Meetings, dates discussed:    Additional Comments:  Dessa Phi, RN 08/23/2016, 12:24 PM

## 2016-08-23 NOTE — Progress Notes (Signed)
    [facility] has been requested to perform a transesophageal echocardiogram on 08/24/16 for Bacteremia.  After careful review of history and examination, the risks and benefits of transesophageal echocardiogram have been explained including risks of esophageal damage, perforation (1:10,000 risk), bleeding, pharyngeal hematoma as well as other potential complications associated with conscious sedation including aspiration, arrhythmia, respiratory failure and death. Alternatives to treatment were discussed, questions were answered. Patient is willing to proceed.   Reino Bellis, NP-C 08/23/2016 11:37 AM

## 2016-08-23 NOTE — Progress Notes (Signed)
Placed patient on CPAP for the night.  Patient is tolerating well at this time. 

## 2016-08-23 NOTE — Progress Notes (Signed)
[place] for warfarin Indication: Mechanical Valve St. Jude  No Known Allergies  Patient Measurements: Height: 6' (182.9 cm) Weight: 255 lb (115.7 kg) IBW/kg (Calculated) : 77.6 Heparin Dosing Weight:   Vital Signs: Temp: 98.9 F (37.2 C) (02/20 0530) Temp Source: Oral (02/20 0530) BP: 152/87 (02/20 0530) Pulse Rate: 73 (02/20 0530)  Labs:  Recent Labs  08/21/16 0607 08/22/16 0455 08/23/16 0506  HGB 11.0*  --   --   HCT 32.5*  --   --   PLT 108*  --   --   LABPROT 29.1* 36.2* 35.8*  INR 2.68 3.53 3.48  CREATININE 0.79 0.71  --     Estimated Creatinine Clearance: 127.3 mL/min (by C-G formula based on SCr of 0.71 mg/dL).    Assessment: Patient wth St. Jude valve with goal INR 2.5-3.5 per prior anti-coag clinic notes.   Admitted with ALM and URI symptoms and sepsis.    Home dose warfarin 7.5mg  daily except 8mg  on wednesdays  08/23/2016  INR with sharp rise yesterday, appears to have peaked overnight after skipping dose 2/19  CBC: (2/18) Hgb decreased but stable, pltc = 108 (improving)   Diet: regular, eating 100%  Drug-drug interactions: no major interactions.  Started on toradol 30mg  IV q8h prn (requesting ~8-10hr).  Based on age and concomitant warfarin, suggest reducing dose to 15mg  IV q8h PRN and use for shortest time possible.    Goal of Therapy:  INR 2.5-3.5   Plan:   Warfarin 2.5 mg tonight. Note recent sepsis, abx, and skipped meals on 2/16-17, so anticipate reduced warfarin need for a time.  Daily INR; CBC at least q72 hr  Monitor for bleeding  Reuel Boom, PharmD, BCPS Pager: [phone] 08/23/2016, 1:57 PM

## 2016-08-23 NOTE — Progress Notes (Signed)
Discussed placement of PICC line with patient including risks, benefits, and alternatives. Patient states wanting to wait until 2-21 after his TEE is complete.

## 2016-08-23 NOTE — Progress Notes (Signed)
PROGRESS NOTE  Jeffery Whitehead  [id number] DOB: 06-Nov-1955 DOA: 08/17/2016 PCP: Charolette Forward, PA-C Outpatient Specialists:  Subjective: Continues to complain about low back pain, MRI to be done later today. PIC line today and TEE in AM   Brief Narrative:  Jeffery Whitehead is a 61 y.o. 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 Patient reported fever, for some time, blood cultures showed MSSA, no known source for now. Await TEE and lumbar MRI to determine if there is infection in his mechanical aortic valve over his lumbar spine. PICC line to be placed later today. Await TEE/lumbar MRI to determine the length of the antibiotics.  Assessment & Plan:   Principal Problem:   Staphylococcus aureus bacteremia with sepsis ([place]) Active Problems:   OSA (obstructive sleep apnea)   S/P AVR (aortic valve replacement)   Hypokalemia   Elevated LFTs   Asthma, chronic, moderate persistent, uncomplicated   Normocytic anemia   Thrombocytopenia (H[facility])   HTN (hypertension)   Back pain   Sepsis. Presented with temperature of 101.3, respiratory rate of 33 and presence of bacteremia. Patient given IV fluids, blood pressure was soft on admission. Started on broad-spectrum antibiotics and Tamiflu. Sepsis physiology resolved, continue current antibiotics.   MSSA bacteremia Unclear source, patient has St. Jude aortic mechanical valve, will obtain TTE. No evidence of pneumonia or other potential sources like skin ulcers, patient mentioned he has some sinus problems. 2-D echo showed no evidence of vegetations on his mechanical valve, will obtain a TEE. TEE to be done on Wednesday, lumbar MRI to be done later today, PICC line to be placed.  Acute encephalopathy Likely acute metabolic encephalopathy secondary to sepsis. This is resolved patient is back to his  baseline.  Hypokalemia:  Presented with potassium of 2.9, repleted with oral supplements.  Elevated LFTs:  Possibly related to sepsis Ultrasound showed fatty infiltration of his liver.  OSA on CPAP:  Continue home CPAP  St. Jude AVR:  Continue warfarin, dosed per pharmacy, INR is 2.1 today.  check 2-D echo to rule out any vegetation in the presence of MSSA bacteremia  Anemia:  This is new. No evidence of bleeding. Trend hemoglobin  Asthma: -Scant wheezing with exam. -Continue Symbicort -Albuterol when necessary  Hypertension: -Hypotensive at admission. -Hold lisinopril until hemodynamics clearer.  Thrombocytopenia Platelets was 143 on admission, went down to 82, this is likely secondary to sepsis. This is secondary to sepsis line proved 108 today.   DVT prophylaxis: On full anticoagulation with warfarin.  Code Status: Full Code Family Communication:  Disposition Plan: To discharge tomorrow after TEE or day after tomorrow. Diet: Diet Heart Room service appropriate? Yes; Fluid consistency: Thin  Consultants:   None  Procedures:   None  Antimicrobials:   None   Objective: Vitals:   08/22/16 2140 08/22/16 2313 08/23/16 0530 08/23/16 1026  BP: (!) 141/79  (!) 152/87   Pulse: 76 77 73   Resp: 20 18 20    Temp: 99.5 F (37.5 C)  98.9 F (37.2 C)   TempSrc: Oral  Oral   SpO2: 96% 98% 98% 95%  Weight:      Height:        Intake/Output Summary (Last 24 hours) at 08/23/16 1137 Last data filed at 08/23/16 0526  Gross per 24 hour  Intake  1084 ml  Output              400 ml  Net              684 ml   Filed Weights   08/17/16 2048  Weight: 115.7 kg (255 lb)    Examination: General exam: Appears calm and comfortable  Respiratory system: Clear to auscultation. Respiratory effort normal. Cardiovascular system: S1 & S2 heard, RRR. No JVD, murmurs, rubs, gallops or clicks. No pedal edema. Gastrointestinal system: Abdomen is  nondistended, soft and nontender. No organomegaly or masses felt. Normal bowel sounds heard. Central nervous system: Alert and oriented. No focal neurological deficits. Extremities: Symmetric 5 x 5 power. Skin: No rashes, lesions or ulcers Psychiatry: Judgement and insight appear normal. Mood & affect appropriate.   Data Reviewed: I have personally reviewed following labs and imaging studies  CBC:  Recent Labs Lab 08/17/16 2104 08/18/16 0351 08/19/16 0403 08/20/16 0535 08/21/16 0607  WBC 12.4* 11.3* 4.0 4.4 4.6  NEUTROABS 11.9*  --   --   --   --   HGB 11.5* 12.0* 11.2* 11.3* 11.0*  HCT 34.8* 36.1* 34.3* 33.7* 32.5*  MCV 84.1 86.0 86.4 84.3 84.4  PLT 143* 142* 82* 97* [id number]*   Basic Metabolic Panel:  Recent Labs Lab 08/18/16 0351 08/19/16 0403 08/20/16 0535 08/21/16 0607 08/22/16 0455  NA 136 136 136 138 136  K 3.4* 3.5 3.6 3.6 3.4*  CL 106 109 105 107 104  CO2 23 22 26 24 22   GLUCOSE 111* 129* 114* 111* 99  BUN 21* 14 11 22* 21*  CREATININE 1.09 0.99 0.78 0.79 0.71  CALCIUM 7.9* 7.9* 8.4* 8.7* 8.4*   GFR: Estimated Creatinine Clearance: 127.3 mL/min (by C-G formula based on SCr of 0.71 mg/dL). Liver Function Tests:  Recent Labs Lab 08/17/16 2104 08/18/16 0351 08/20/16 0535  AST 72* 100* 68*  ALT 59 77* 69*  ALKPHOS 62 63 109  BILITOT 1.6* 1.8* 1.3*  PROT 6.4* 6.4* 6.2*  ALBUMIN 3.8 3.5 3.1*   No results for input(s): LIPASE, AMYLASE in the last 168 hours. No results for input(s): AMMONIA in the last 168 hours. Coagulation Profile:  Recent Labs Lab 08/19/16 0403 08/20/16 0535 08/21/16 0607 08/22/16 0455 08/23/16 0506  INR 2.18 2.36 2.68 3.53 3.48   Cardiac Enzymes: No results for input(s): CKTOTAL, CKMB, CKMBINDEX, TROPONINI in the last 168 hours. BNP (last 3 results) No results for input(s): PROBNP in the last 8760 hours. HbA1C: No results for input(s): HGBA1C in the last 72 hours. CBG: No results for input(s): GLUCAP in the last 168  hours. Lipid Profile: No results for input(s): CHOL, HDL, LDLCALC, TRIG, CHOLHDL, LDLDIRECT in the last 72 hours. Thyroid Function Tests: No results for input(s): TSH, T4TOTAL, FREET4, T3FREE, THYROIDAB in the last 72 hours. Anemia Panel: No results for input(s): VITAMINB12, FOLATE, FERRITIN, TIBC, IRON, RETICCTPCT in the last 72 hours. Urine analysis:    Component Value Date/Time   COLORURINE YELLOW 08/18/2016 0044   APPEARANCEUR HAZY (A) 08/18/2016 0044   LABSPEC 1.019 08/18/2016 0044   PHURINE 5.0 08/18/2016 0044   GLUCOSEU NEGATIVE 08/18/2016 0044   HGBUR MODERATE (A) 08/18/2016 0044   BILIRUBINUR NEGATIVE 08/18/2016 0044   KETONESUR NEGATIVE 08/18/2016 0044   PROTEINUR 30 (A) 08/18/2016 0044   NITRITE NEGATIVE 08/18/2016 0044   LEUKOCYTESUR NEGATIVE 08/18/2016 0044   Sepsis Labs: @LABRCNTIP (procalcitonin:4,lacticidven:4)  ) Recent Results (from the past 240 hour(s))  Culture, blood (Routine x 2)  Status: Abnormal   Collection Time: 08/17/16  9:05 PM  Result Value Ref Range Status   Specimen Description BLOOD RIGHT ANTECUBITAL  Final   Special Requests BOTTLES DRAWN AEROBIC AND ANAEROBIC 5 CC  Final   Culture  Setup Time   Final    GRAM POSITIVE COCCI IN CLUSTERS IN BOTH AEROBIC AND ANAEROBIC BOTTLES CRITICAL RESULT CALLED TO, READ BACK BY AND VERIFIED WITH: Christean Grief Pharm.D. 13:00 08/18/16  ([email or web address])    Culture (A)  Final    STAPHYLOCOCCUS AUREUS SUSCEPTIBILITIES PERFORMED ON PREVIOUS CULTURE WITHIN THE LAST 5 DAYS. Performed at [facility], [place] [address], [place], [place] [address]    Report Status 08/20/2016 FINAL  Final  Culture, blood (Routine x 2)     Status: Abnormal   Collection Time: 08/17/16  9:05 PM  Result Value Ref Range Status   Specimen Description BLOOD LEFT HAND  Final   Special Requests BOTTLES DRAWN AEROBIC AND ANAEROBIC 5 CC  Final   Culture  Setup Time   Final    GRAM POSITIVE COCCI IN CLUSTERS IN BOTH AEROBIC AND ANAEROBIC  BOTTLES CRITICAL VALUE NOTED.  VALUE IS CONSISTENT WITH PREVIOUSLY REPORTED AND CALLED VALUE. Performed at [facility], [place] [address], [place], [place] [address]    Culture STAPHYLOCOCCUS AUREUS (A)  Final   Report Status 08/20/2016 FINAL  Final   Organism ID, Bacteria STAPHYLOCOCCUS AUREUS  Final      Susceptibility   Staphylococcus aureus - MIC*    CIPROFLOXACIN <=0.5 SENSITIVE Sensitive     ERYTHROMYCIN 0.5 SENSITIVE Sensitive     GENTAMICIN <=0.5 SENSITIVE Sensitive     OXACILLIN 0.5 SENSITIVE Sensitive     TETRACYCLINE <=1 SENSITIVE Sensitive     VANCOMYCIN 1 SENSITIVE Sensitive     TRIMETH/SULFA <=10 SENSITIVE Sensitive     CLINDAMYCIN <=0.25 SENSITIVE Sensitive     RIFAMPIN <=0.5 SENSITIVE Sensitive     Inducible Clindamycin NEGATIVE Sensitive     * STAPHYLOCOCCUS AUREUS  Blood Culture ID Panel (Reflexed)     Status: Abnormal   Collection Time: 08/17/16  9:05 PM  Result Value Ref Range Status   Enterococcus species NOT DETECTED NOT DETECTED Final   Listeria monocytogenes NOT DETECTED NOT DETECTED Final   Staphylococcus species DETECTED (A) NOT DETECTED Final    Comment: CRITICAL RESULT CALLED TO, READ BACK BY AND VERIFIED WITH: Mila Merry.D. 13:00 08/18/16 ([email or web address])    Staphylococcus aureus DETECTED (A) NOT DETECTED Final    Comment: Methicillin (oxacillin) susceptible Staphylococcus aureus (MSSA). Preferred therapy is anti staphylococcal beta lactam antibiotic (Cefazolin or Nafcillin), unless clinically contraindicated. CRITICAL RESULT CALLED TO, READ BACK BY AND VERIFIED WITH: Christean Grief Pharm.D. 13:00 08/18/16 ([email or web address])    Methicillin resistance NOT DETECTED NOT DETECTED Final   Streptococcus species NOT DETECTED NOT DETECTED Final   Streptococcus agalactiae NOT DETECTED NOT DETECTED Final   Streptococcus pneumoniae NOT DETECTED NOT DETECTED Final   Streptococcus pyogenes NOT DETECTED NOT DETECTED Final   Acinetobacter baumannii NOT DETECTED NOT DETECTED Final    Enterobacteriaceae species NOT DETECTED NOT DETECTED Final   Enterobacter cloacae complex NOT DETECTED NOT DETECTED Final   Escherichia coli NOT DETECTED NOT DETECTED Final   Klebsiella oxytoca NOT DETECTED NOT DETECTED Final   Klebsiella pneumoniae NOT DETECTED NOT DETECTED Final   Proteus species NOT DETECTED NOT DETECTED Final   Serratia marcescens NOT DETECTED NOT DETECTED Final   Haemophilus influenzae NOT DETECTED NOT DETECTED Final   Neisseria meningitidis  NOT DETECTED NOT DETECTED Final   Pseudomonas aeruginosa NOT DETECTED NOT DETECTED Final   Candida albicans NOT DETECTED NOT DETECTED Final   Candida glabrata NOT DETECTED NOT DETECTED Final   Candida krusei NOT DETECTED NOT DETECTED Final   Candida parapsilosis NOT DETECTED NOT DETECTED Final   Candida tropicalis NOT DETECTED NOT DETECTED Final    Comment: Performed at [facility], [place] [address], [place], [place] [address]  Urine culture     Status: Abnormal   Collection Time: 08/18/16 12:52 AM  Result Value Ref Range Status   Specimen Description URINE, CLEAN CATCH  Final   Special Requests NONE  Final   Culture (A)  Final    50,000 COLONIES/mL STAPHYLOCOCCUS SPECIES (COAGULASE NEGATIVE)   Report Status 08/21/2016 FINAL  Final   Organism ID, Bacteria STAPHYLOCOCCUS SPECIES (COAGULASE NEGATIVE) (A)  Final      Susceptibility   Staphylococcus species (coagulase negative) - MIC*    CIPROFLOXACIN <=0.5 SENSITIVE Sensitive     GENTAMICIN <=0.5 SENSITIVE Sensitive     NITROFURANTOIN <=16 SENSITIVE Sensitive     OXACILLIN >=4 RESISTANT Resistant     TETRACYCLINE 2 SENSITIVE Sensitive     VANCOMYCIN 2 SENSITIVE Sensitive     TRIMETH/SULFA <=10 SENSITIVE Sensitive     CLINDAMYCIN >=8 RESISTANT Resistant     RIFAMPIN <=0.5 SENSITIVE Sensitive     Inducible Clindamycin NEGATIVE Sensitive     * 50,000 COLONIES/mL STAPHYLOCOCCUS SPECIES (COAGULASE NEGATIVE)  MRSA PCR Screening     Status: None   Collection Time:  08/18/16  2:00 AM  Result Value Ref Range Status   MRSA by PCR NEGATIVE NEGATIVE Final    Comment:        The GeneXpert MRSA Assay (FDA approved for NASAL specimens only), is one component of a comprehensive MRSA colonization surveillance program. It is not intended to diagnose MRSA infection nor to guide or monitor treatment for MRSA infections.   Culture, blood (Routine X 2) w Reflex to ID Panel     Status: None (Preliminary result)   Collection Time: 08/19/16  7:56 AM  Result Value Ref Range Status   Specimen Description BLOOD RIGHT ANTECUBITAL  Final   Special Requests BOTTLES DRAWN AEROBIC AND ANAEROBIC 10CC  Final   Culture   Final    NO GROWTH 4 DAYS Performed at [facility], [place] [address], [place], [place] [address]    Report Status PENDING  Incomplete  Culture, blood (Routine X 2) w Reflex to ID Panel     Status: Abnormal   Collection Time: 08/19/16  8:01 AM  Result Value Ref Range Status   Specimen Description BLOOD RIGHT HAND  Final   Special Requests IN PEDIATRIC BOTTLE 3CC  Final   Culture  Setup Time   Final    GRAM POSITIVE COCCI IN CLUSTERS AEROBIC BOTTLE ONLY CRITICAL VALUE NOTED.  VALUE IS CONSISTENT WITH PREVIOUSLY REPORTED AND CALLED VALUE.    Culture (A)  Final    STAPHYLOCOCCUS AUREUS SUSCEPTIBILITIES PERFORMED ON PREVIOUS CULTURE WITHIN THE LAST 5 DAYS. Performed at [facility], [place] [address] [place], [place] [address]    Report Status 08/21/2016 FINAL  Final  Culture, blood (Routine X 2) w Reflex to ID Panel     Status: None (Preliminary result)   Collection Time: 08/20/16 10:30 AM  Result Value Ref Range Status   Specimen Description BLOOD RIGHT ARM  8 ML IN [facility] BOTTLE  Final   Special  Requests Immunocompromised  Final   Culture   Final    NO GROWTH 3 DAYS Performed at [facility], [place] [address] [place], [place] [address]    Report Status PENDING  Incomplete  Culture, blood (Routine X 2) w Reflex to ID Panel      Status: None (Preliminary result)   Collection Time: 08/20/16 10:30 AM  Result Value Ref Range Status   Specimen Description BLOOD RIGHT HAND  3 ML IN YELLOW  Final   Special Requests Immunocompromised  Final   Culture   Final    NO GROWTH 3 DAYS Performed at [facility], [place] [address] [place],  [address]    Report Status PENDING  Incomplete     Invalid input(s): PROCALCITONIN, LACTICACIDVEN   Radiology Studies: No results found.      Scheduled Meds: .  ceFAZolin (ANCEF) IV  2 g Intravenous Q8H  . clonazePAM  0.25 mg Oral QHS  . mometasone-formoterol  2 puff Inhalation BID  . pantoprazole  40 mg Oral Daily  . Warfarin - Pharmacist Dosing Inpatient   Does not apply q1800  . zolpidem  10 mg Oral QHS   Continuous Infusions:    LOS: 5 days    Time spent: 35 minutes    Tatum Massman A, MD [facility] Pager [phone]  If 7PM-7AM, please contact night-coverage [email or web address] Password [facility] 08/23/2016, 11:37 AM

## 2016-08-23 NOTE — Progress Notes (Signed)
Patient ID: Jeffery Whitehead, male   DOB: [birth year], 61 y.o.   MRN: [id number]          [facility]  Date of Admission:  08/17/2016           Day 6 cefazolin  Principal Problem:   Staphylococcus aureus bacteremia with sepsis [facility]) Active Problems:   S/P AVR (aortic valve replacement)   OSA (obstructive sleep apnea)   Hypokalemia   Elevated LFTs   Asthma, chronic, moderate persistent, uncomplicated   Normocytic anemia   Thrombocytopenia (HCC)   HTN (hypertension)   Back pain   .  ceFAZolin (ANCEF) IV  2 g Intravenous Q8H  . clonazePAM  0.25 mg Oral QHS  . mometasone-formoterol  2 puff Inhalation BID  . pantoprazole  40 mg Oral Daily  . warfarin  2.5 mg Oral ONCE-1800  . Warfarin - Pharmacist Dosing Inpatient   Does not apply q1800  . zolpidem  10 mg Oral QHS    SUBJECTIVE: He had a severe, 10+ out of 10, episode of back pain last night.  Review of Systems: Review of Systems  Constitutional: Positive for diaphoresis and malaise/fatigue. Negative for chills, fever and weight loss.  HENT: Negative for sore throat.   Respiratory: Positive for cough. Negative for sputum production and shortness of breath.   Cardiovascular: Negative for chest pain.  Gastrointestinal: Negative for abdominal pain, diarrhea, heartburn, nausea and vomiting.  Genitourinary: Negative for dysuria and frequency.  Musculoskeletal: Positive for back pain. Negative for joint pain and myalgias.  Skin: Negative for rash.  Neurological: Negative for dizziness and headaches.    Past Medical History:  Diagnosis Date  . Cold   . Decreased anal sphincter tone   . Heart valve replaced    1983   [facility]  . Insomnia   . Shortness of breath dyspnea   . Sleep apnea    cpap  . Varicose vein of leg   . Varicose veins     Social History  Substance Use Topics  . Smoking status: Never Smoker  . Smokeless tobacco: Never Used  . Alcohol use 0.0 oz/week     Comment: wine at  night    Family History  Problem Relation Age of Onset  . Heart disease Father    No Known Allergies  OBJECTIVE: Vitals:   08/22/16 2140 08/22/16 2313 08/23/16 0530 08/23/16 1026  BP: (!) 141/79  (!) 152/87   Pulse: 76 77 73   Resp: 20 18 20    Temp: 99.5 F (37.5 C)  98.9 F (37.2 C)   TempSrc: Oral  Oral   SpO2: 96% 98% 98% 95%  Weight:      Height:       Body mass index is 34.58 kg/m.  Physical Exam  Constitutional: He is oriented to person, place, and time.  He is sitting up in a chair attempting to read a book. He looks exhausted and worn out.  HENT:  Mouth/Throat: No oropharyngeal exudate.  Eyes: Conjunctivae are normal.  Cardiovascular: Normal rate and regular rhythm.   Murmur heard. 1/6 early systolic ejection murmur. Crisp valve sounds.  Pulmonary/Chest: Effort normal and breath sounds normal. He has no wheezes. He has no rales.  His frequent dry cough is more noticeable today.  Abdominal: Soft. He exhibits no mass. There is no tenderness.  Musculoskeletal: Normal range of motion.  Neurological: He is alert and oriented to person, place, and time.  Skin: No rash noted.  Psychiatric:  Mood and affect normal.    Lab Results Lab Results  Component Value Date   WBC 4.6 08/21/2016   HGB 11.0 (L) 08/21/2016   HCT 32.5 (L) 08/21/2016   MCV 84.4 08/21/2016   PLT 108 (L) 08/21/2016    Lab Results  Component Value Date   CREATININE 0.71 08/22/2016   BUN 21 (H) 08/22/2016   NA 136 08/22/2016   K 3.4 (L) 08/22/2016   CL 104 08/22/2016   CO2 22 08/22/2016    Lab Results  Component Value Date   ALT 69 (H) 08/20/2016   AST 68 (H) 08/20/2016   ALKPHOS 109 08/20/2016   BILITOT 1.3 (H) 08/20/2016     Microbiology: Recent Results (from the past 240 hour(s))  Culture, blood (Routine x 2)     Status: Abnormal   Collection Time: 08/17/16  9:05 PM  Result Value Ref Range Status   Specimen Description BLOOD RIGHT ANTECUBITAL  Final   Special Requests BOTTLES  DRAWN AEROBIC AND ANAEROBIC 5 CC  Final   Culture  Setup Time   Final    GRAM POSITIVE COCCI IN CLUSTERS IN BOTH AEROBIC AND ANAEROBIC BOTTLES CRITICAL RESULT CALLED TO, READ BACK BY AND VERIFIED WITH: Christean Grief Pharm.D. 13:00 08/18/16  ([email or web address])    Culture (A)  Final    STAPHYLOCOCCUS AUREUS SUSCEPTIBILITIES PERFORMED ON PREVIOUS CULTURE WITHIN THE LAST 5 DAYS. Performed at [facility], [place] [address] [place], [place] [address]    Report Status 08/20/2016 FINAL  Final  Culture, blood (Routine x 2)     Status: Abnormal   Collection Time: 08/17/16  9:05 PM  Result Value Ref Range Status   Specimen Description BLOOD LEFT HAND  Final   Special Requests BOTTLES DRAWN AEROBIC AND ANAEROBIC 5 CC  Final   Culture  Setup Time   Final    GRAM POSITIVE COCCI IN CLUSTERS IN BOTH AEROBIC AND ANAEROBIC BOTTLES CRITICAL VALUE NOTED.  VALUE IS CONSISTENT WITH PREVIOUSLY REPORTED AND CALLED VALUE. Performed at [facility], [place] [address] [place], [place] [address]    Culture STAPHYLOCOCCUS AUREUS (A)  Final   Report Status 08/20/2016 FINAL  Final   Organism ID, Bacteria STAPHYLOCOCCUS AUREUS  Final      Susceptibility   Staphylococcus aureus - MIC*    CIPROFLOXACIN <=0.5 SENSITIVE Sensitive     ERYTHROMYCIN 0.5 SENSITIVE Sensitive     GENTAMICIN <=0.5 SENSITIVE Sensitive     OXACILLIN 0.5 SENSITIVE Sensitive     TETRACYCLINE <=1 SENSITIVE Sensitive     VANCOMYCIN 1 SENSITIVE Sensitive     TRIMETH/SULFA <=10 SENSITIVE Sensitive     CLINDAMYCIN <=0.25 SENSITIVE Sensitive     RIFAMPIN <=0.5 SENSITIVE Sensitive     Inducible Clindamycin NEGATIVE Sensitive     * STAPHYLOCOCCUS AUREUS  Blood Culture ID Panel (Reflexed)     Status: Abnormal   Collection Time: 08/17/16  9:05 PM  Result Value Ref Range Status   Enterococcus species NOT DETECTED NOT DETECTED Final   Listeria monocytogenes NOT DETECTED NOT DETECTED Final   Staphylococcus species DETECTED (A) NOT DETECTED Final     Comment: CRITICAL RESULT CALLED TO, READ BACK BY AND VERIFIED WITH: Mila Merry.D. 13:00 08/18/16 ([email or web address])    Staphylococcus aureus DETECTED (A) NOT DETECTED Final    Comment: Methicillin (oxacillin) susceptible Staphylococcus aureus (MSSA). Preferred therapy is anti staphylococcal beta lactam antibiotic (Cefazolin or Nafcillin), unless clinically contraindicated. CRITICAL RESULT CALLED TO, READ BACK BY AND VERIFIED WITH:  Christean Grief Pharm.D. 13:00 08/18/16 ([email or web address])    Methicillin resistance NOT DETECTED NOT DETECTED Final   Streptococcus species NOT DETECTED NOT DETECTED Final   Streptococcus agalactiae NOT DETECTED NOT DETECTED Final   Streptococcus pneumoniae NOT DETECTED NOT DETECTED Final   Streptococcus pyogenes NOT DETECTED NOT DETECTED Final   Acinetobacter baumannii NOT DETECTED NOT DETECTED Final   Enterobacteriaceae species NOT DETECTED NOT DETECTED Final   Enterobacter cloacae complex NOT DETECTED NOT DETECTED Final   Escherichia coli NOT DETECTED NOT DETECTED Final   Klebsiella oxytoca NOT DETECTED NOT DETECTED Final   Klebsiella pneumoniae NOT DETECTED NOT DETECTED Final   Proteus species NOT DETECTED NOT DETECTED Final   Serratia marcescens NOT DETECTED NOT DETECTED Final   Haemophilus influenzae NOT DETECTED NOT DETECTED Final   Neisseria meningitidis NOT DETECTED NOT DETECTED Final   Pseudomonas aeruginosa NOT DETECTED NOT DETECTED Final   Candida albicans NOT DETECTED NOT DETECTED Final   Candida glabrata NOT DETECTED NOT DETECTED Final   Candida krusei NOT DETECTED NOT DETECTED Final   Candida parapsilosis NOT DETECTED NOT DETECTED Final   Candida tropicalis NOT DETECTED NOT DETECTED Final    Comment: Performed at [facility], [address] [place], [place] [address]  Urine culture     Status: Abnormal   Collection Time: 08/18/16 12:52 AM  Result Value Ref Range Status   Specimen Description URINE, CLEAN CATCH  Final   Special Requests NONE  Final    Culture (A)  Final    50,000 COLONIES/mL STAPHYLOCOCCUS SPECIES (COAGULASE NEGATIVE)   Report Status 08/21/2016 FINAL  Final   Organism ID, Bacteria STAPHYLOCOCCUS SPECIES (COAGULASE NEGATIVE) (A)  Final      Susceptibility   Staphylococcus species (coagulase negative) - MIC*    CIPROFLOXACIN <=0.5 SENSITIVE Sensitive     GENTAMICIN <=0.5 SENSITIVE Sensitive     NITROFURANTOIN <=16 SENSITIVE Sensitive     OXACILLIN >=4 RESISTANT Resistant     TETRACYCLINE 2 SENSITIVE Sensitive     VANCOMYCIN 2 SENSITIVE Sensitive     TRIMETH/SULFA <=10 SENSITIVE Sensitive     CLINDAMYCIN >=8 RESISTANT Resistant     RIFAMPIN <=0.5 SENSITIVE Sensitive     Inducible Clindamycin NEGATIVE Sensitive     * 50,000 COLONIES/mL STAPHYLOCOCCUS SPECIES (COAGULASE NEGATIVE)  MRSA PCR Screening     Status: None   Collection Time: 08/18/16  2:00 AM  Result Value Ref Range Status   MRSA by PCR NEGATIVE NEGATIVE Final    Comment:        The GeneXpert MRSA Assay (FDA approved for NASAL specimens only), is one component of a comprehensive MRSA colonization surveillance program. It is not intended to diagnose MRSA infection nor to guide or monitor treatment for MRSA infections.   Culture, blood (Routine X 2) w Reflex to ID Panel     Status: None (Preliminary result)   Collection Time: 08/19/16  7:56 AM  Result Value Ref Range Status   Specimen Description BLOOD RIGHT ANTECUBITAL  Final   Special Requests BOTTLES DRAWN AEROBIC AND ANAEROBIC 10CC  Final   Culture   Final    NO GROWTH 4 DAYS Performed at [facility], [place] [address], [place], [place] [address]    Report Status PENDING  Incomplete  Culture, blood (Routine X 2) w Reflex to ID Panel     Status: Abnormal   Collection Time: 08/19/16  8:01 AM  Result Value Ref Range Status   Specimen Description BLOOD RIGHT HAND  Final   Special Requests IN PEDIATRIC BOTTLE 3CC  Final   Culture  Setup Time   Final    GRAM POSITIVE COCCI IN CLUSTERS AEROBIC  BOTTLE ONLY CRITICAL VALUE NOTED.  VALUE IS CONSISTENT WITH PREVIOUSLY REPORTED AND CALLED VALUE.    Culture (A)  Final    STAPHYLOCOCCUS AUREUS SUSCEPTIBILITIES PERFORMED ON PREVIOUS CULTURE WITHIN THE LAST 5 DAYS. Performed at [facility], [place] [address], [place], [place] [address]    Report Status 08/21/2016 FINAL  Final  Culture, blood (Routine X 2) w Reflex to ID Panel     Status: None (Preliminary result)   Collection Time: 08/20/16 10:30 AM  Result Value Ref Range Status   Specimen Description BLOOD RIGHT ARM  8 ML IN [facility] BOTTLE  Final   Special Requests Immunocompromised  Final   Culture   Final    NO GROWTH 3 DAYS Performed at [facility], [place] [address] [place], [place] [address]    Report Status PENDING  Incomplete  Culture, blood (Routine X 2) w Reflex to ID Panel     Status: None (Preliminary result)   Collection Time: 08/20/16 10:30 AM  Result Value Ref Range Status   Specimen Description BLOOD RIGHT HAND  3 ML IN YELLOW  Final   Special Requests Immunocompromised  Final   Culture   Final    NO GROWTH 3 DAYS Performed at [facility], [place] [address], [place], [place] [address]    Report Status PENDING  Incomplete     ASSESSMENT: He has MSSA bacteremia almost certainly complicated by early lumbar infection. His repeat blood cultures are negative at 72 hours so it is safe to have a PICC placed. I talked to his wife again today by phone about her current plans.  PLAN: 1. Continue cefazolin  2. PICC placement 3. Await results of lumbar MRI 4. Await results of transesophageal echocardiogram  Michel Bickers, MD [facility] for [place] [phone] pager   [phone] cell 08/23/2016, 3:27 PM

## 2016-08-23 NOTE — Care Management Note (Signed)
Case Management Note  Patient Details  Name: Jeffery Whitehead MRN: [id number] Date of Birth: December 25, 1955  Subjective/Objective:                    Action/Plan:   Expected Discharge Date:                  Expected Discharge Plan:  [place]  In-House Referral:     Discharge planning Services  CM Consult  Post Acute Care Choice:  Home Health Choice offered to:  Patient  DME Arranged:    DME Agency:     HH Arranged:    [place] Agency:     Status of Service:  In process, will continue to follow  If discussed at Long Length of Stay Meetings, dates discussed:    Additional Comments:  Dessa Phi, RN 08/23/2016, 12:02 PM

## 2016-08-24 ENCOUNTER — Encounter (HOSPITAL_COMMUNITY)
Admission: EM | Disposition: A | Discharge status: Skilled Nursing Facility | Payer: Self-pay | Source: Home / Self Care | Attending: Internal Medicine

## 2016-08-24 ENCOUNTER — Inpatient Hospital Stay (HOSPITAL_COMMUNITY): Payer: 59

## 2016-08-24 ENCOUNTER — Encounter (HOSPITAL_COMMUNITY): Payer: Self-pay

## 2016-08-24 DIAGNOSIS — I340 Nonrheumatic mitral (valve) insufficiency: Secondary | ICD-10-CM

## 2016-08-24 DIAGNOSIS — E876 Hypokalemia: Secondary | ICD-10-CM

## 2016-08-24 DIAGNOSIS — A4101 Sepsis due to Methicillin susceptible Staphylococcus aureus: Secondary | ICD-10-CM

## 2016-08-24 DIAGNOSIS — J4540 Moderate persistent asthma, uncomplicated: Secondary | ICD-10-CM

## 2016-08-24 DIAGNOSIS — I38 Endocarditis, valve unspecified: Secondary | ICD-10-CM

## 2016-08-24 DIAGNOSIS — R7989 Other specified abnormal findings of blood chemistry: Secondary | ICD-10-CM

## 2016-08-24 HISTORY — PX: TEE WITHOUT CARDIOVERSION: SHX5443

## 2016-08-24 HISTORY — PX: FLUOROSCOPY GUIDANCE: CATH118240

## 2016-08-24 LAB — CBC
HCT: 30.2 % — ABNORMAL LOW (ref 39.0–52.0)
Hemoglobin: 10 g/dL — ABNORMAL LOW (ref 13.0–17.0)
MCH: 27.6 pg (ref 26.0–34.0)
MCHC: 33.1 g/dL (ref 30.0–36.0)
MCV: 83.4 fL (ref 78.0–100.0)
PLATELETS: 218 10*3/uL (ref 150–400)
RBC: 3.62 MIL/uL — AB (ref 4.22–5.81)
RDW: 14 % (ref 11.5–15.5)
WBC: 7 10*3/uL (ref 4.0–10.5)

## 2016-08-24 LAB — BASIC METABOLIC PANEL
Anion gap: 6 (ref 5–15)
BUN: 13 mg/dL (ref 6–20)
CALCIUM: 8.7 mg/dL — AB (ref 8.9–10.3)
CHLORIDE: 104 mmol/L (ref 101–111)
CO2: 26 mmol/L (ref 22–32)
Creatinine, Ser: 0.74 mg/dL (ref 0.61–1.24)
GFR calc Af Amer: >60 mL/min (ref >60)
GFR calc non Af Amer: >60 mL/min (ref >60)
GLUCOSE: 114 mg/dL — AB (ref 65–99)
Potassium: 4.4 mmol/L (ref 3.5–5.1)
Sodium: 136 mmol/L (ref 135–145)

## 2016-08-24 LAB — CULTURE, BLOOD (ROUTINE X 2): Culture: NO GROWTH

## 2016-08-24 LAB — PROTIME-INR
INR: 2.33
PROTHROMBIN TIME: 26 s — AB (ref 11.4–15.2)

## 2016-08-24 SURGERY — ECHOCARDIOGRAM, TRANSESOPHAGEAL
Anesthesia: Moderate Sedation

## 2016-08-24 SURGERY — FLUOROSCOPY GUIDANCE
Anesthesia: LOCAL

## 2016-08-24 MED ORDER — KETOROLAC TROMETHAMINE 30 MG/ML IJ SOLN
INTRAMUSCULAR | Status: AC
  Filled 2016-08-24: qty 1

## 2016-08-24 MED ORDER — LIDOCAINE VISCOUS 2 % MT SOLN
OROMUCOSAL | Status: DC | PRN
Start: 2016-08-24 — End: 2016-08-24
  Administered 2016-08-24: 1 via OROMUCOSAL

## 2016-08-24 MED ORDER — WARFARIN SODIUM 5 MG PO TABS
7.5000 mg | ORAL_TABLET | Freq: Once | ORAL | Status: AC
  Administered 2016-08-24: 18:00:00 7.5 mg via ORAL
  Filled 2016-08-24: qty 1

## 2016-08-24 MED ORDER — SODIUM CHLORIDE 0.9% FLUSH
10.0000 mL | INTRAVENOUS | Status: DC | PRN
  Administered 2016-08-26 (×2): 10 mL
  Filled 2016-08-24 (×2): qty 40

## 2016-08-24 MED ORDER — SODIUM CHLORIDE 0.9 % IV SOLN: 250.0000 mL | INTRAVENOUS | Status: DC | PRN

## 2016-08-24 MED ORDER — BUTAMBEN-TETRACAINE-BENZOCAINE 2-2-14 % EX AERO
INHALATION_SPRAY | CUTANEOUS | Status: DC | PRN
  Administered 2016-08-24: 2 via TOPICAL
  Administered 2016-08-24: 1 via TOPICAL

## 2016-08-24 MED ORDER — MIDAZOLAM HCL 5 MG/ML IJ SOLN
INTRAMUSCULAR | Status: AC
  Filled 2016-08-24: qty 2

## 2016-08-24 MED ORDER — FENTANYL CITRATE (PF) 100 MCG/2ML IJ SOLN
INTRAMUSCULAR | Status: AC
  Filled 2016-08-24: qty 2

## 2016-08-24 MED ORDER — SODIUM CHLORIDE 0.9 % IV SOLN: INTRAVENOUS | Status: DC

## 2016-08-24 MED ORDER — CEFAZOLIN SODIUM-DEXTROSE 2-4 GM/100ML-% IV SOLN
2.0000 g | Freq: Three times a day (TID) | INTRAVENOUS | Status: DC
  Administered 2016-08-24 – 2016-08-26 (×6): 2 g via INTRAVENOUS
  Filled 2016-08-24 (×7): qty 100

## 2016-08-24 MED ORDER — SODIUM CHLORIDE 0.9% FLUSH: 3.0000 mL | INTRAVENOUS | Status: DC | PRN

## 2016-08-24 MED ORDER — LIDOCAINE VISCOUS 2 % MT SOLN
OROMUCOSAL | Status: AC
  Filled 2016-08-24: qty 15

## 2016-08-24 MED ORDER — SODIUM CHLORIDE 0.9% FLUSH
3.0000 mL | Freq: Two times a day (BID) | INTRAVENOUS | Status: DC
  Administered 2016-08-24: 3 mL via INTRAVENOUS

## 2016-08-24 MED ORDER — FENTANYL CITRATE (PF) 100 MCG/2ML IJ SOLN
INTRAMUSCULAR | Status: DC | PRN
  Administered 2016-08-24: 50 ug via INTRAVENOUS
  Administered 2016-08-24: 25 ug via INTRAVENOUS

## 2016-08-24 MED ORDER — RIFAMPIN 300 MG PO CAPS
300.0000 mg | ORAL_CAPSULE | Freq: Three times a day (TID) | ORAL | Status: DC
  Administered 2016-08-24 – 2016-08-26 (×5): 300 mg via ORAL
  Filled 2016-08-24 (×6): qty 1

## 2016-08-24 MED ORDER — MIDAZOLAM HCL 10 MG/2ML IJ SOLN
INTRAMUSCULAR | Status: DC | PRN
Start: 2016-08-24 — End: 2016-08-24
  Administered 2016-08-24: 1 mg via INTRAVENOUS
  Administered 2016-08-24 (×2): 2 mg via INTRAVENOUS

## 2016-08-24 NOTE — CV Procedure (Signed)
TRANSESOPHAGEAL ECHOCARDIOGRAM (TEE) NOTE  INDICATIONS: infective endocarditis  PROCEDURE:   Informed consent was obtained prior to the procedure. The risks, benefits and alternatives for the procedure were discussed and the patient comprehended these risks.  Risks include, but are not limited to, cough, sore throat, vomiting, nausea, somnolence, esophageal and stomach trauma or perforation, bleeding, low blood pressure, aspiration, pneumonia, infection, trauma to the teeth and death.    After a procedural time-out, the patient was given 5 mg versed and 100 mcg fentanyl for moderate sedation.  The patient's heart rate, blood pressure, and oxygen saturation are monitored continuously during the procedure.The oropharynx was anesthetized 10 cc of topical 1% viscous lidocaine and 2 cetacaine sprays.  The transesophageal probe was inserted in the esophagus and stomach without difficulty and multiple views were obtained.  The patient was kept under observation until the patient left the procedure room.  The period of conscious sedation is 34 minutes, of which I was present face-to-face 100% of this time. The patient left the procedure room in stable condition.   Agitated microbubble saline contrast was not administered.  COMPLICATIONS:    There were no immediate complications.  Findings:  1. LEFT VENTRICLE: The left ventricular wall thickness is mildly increased.  The left ventricular cavity is normal in size. Wall motion is normal.  LVEF is 55-60%.  2. RIGHT VENTRICLE:  The right ventricle is normal in structure and function without any thrombus or masses.    3. LEFT ATRIUM:  The left atrium is dilated in size without any thrombus or masses.  There is not spontaneous echo contrast ("smoke") in the left atrium consistent with a low flow state.  4. LEFT ATRIAL APPENDAGE:  The left atrial appendage is free of any thrombus or masses. The appendage has single lobes. Pulse doppler indicates  moderate flow in the appendage.  5. ATRIAL SEPTUM:  The atrial septum appears intact and is free of thrombus and/or masses.  There is no evidence for interatrial shunting by color doppler and saline microbubble.  6. RIGHT ATRIUM:  The right atrium is normal in size and function without any thrombus or masses.  7. MITRAL VALVE:  The mitral valve demonstrates mild prolapse of the anterior leaflet with Mild regurgitation and multiple jets.  There were no vegetations or stenosis.  8. AORTIC VALVE:  The aortic valve is a [facility]. Jude bileaflet valve, although the tilting bileaflets are not well-visualized. There is trivial regurgitation.  There is a small, mobile mass on the LVOT side of the valve which could represent vegetation. The mean aortic valve gradient is increased, possibly suggesting obstruction.  9. TRICUSPID VALVE:  The tricuspid valve is normal in structure and function with Mild regurgitation.  There were no vegetations or stenosis  10.  PULMONIC VALVE:  The pulmonic valve is normal in structure and function with trivial regurgitation.  There were no vegetations or stenosis.   11. AORTIC ARCH, ASCENDING AND DESCENDING AORTA:  There was no Ron Parker et. Al, 1992) atherosclerosis of the ascending aorta, aortic arch, or proximal descending aorta.  12. PULMONARY VEINS: Anomalous pulmonary venous return was not noted.  13. PERICARDIUM: The pericardium appeared normal and non-thickened.  There is no pericardial effusion.  IMPRESSION:   1. Mechanical St. Jude bileaflet aortic valve with possible vegetation and increased gradients. Bileaflet motion was not well-visualized. Recommend fluoroscopy to visualize the leaflets further. I cannot exclude endocarditis - there is a small, mobile mass attached to the LVOT side of the  valve which could be vegetation.  RECOMMENDATIONS:    1. Increased aortic valve gradient and possible small vegetation on the LVOT side of the valve. Would treat as if this is  endocarditis. Recommend fluoroscopy of the valve to r/o leaflet obstruction.  Time Spent Directly with the Patient:  60 minutes   Pixie Casino, MD, [facility] Attending Cardiologist [facility]  08/24/2016, 1:28 PM

## 2016-08-24 NOTE — Procedures (Signed)
Placed patient on CPAP for the night.  Patient tolerating well at this time. 

## 2016-08-24 NOTE — Progress Notes (Signed)
PROGRESS NOTE  Jeffery Whitehead  [id number] DOB: May 24, 1956 DOA: 08/17/2016 PCP: Charolette Forward, PA-C  Subjective: Continues to complain about low back pain, MRI with concerns for discitis on L5-S1 region. Had TEE suggesting endocarditis and small vegetation on prosthetic valve. No CP, no SOB.  Brief Narrative:  Jeffery Whitehead is a 61 y.o. 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 Patient reported fever, for some time, blood cultures showed MSSA, no known source for now. Await TEE and lumbar MRI to determine if there is infection in his mechanical aortic valve over his lumbar spine. PICC line to be placed later today. TEE with endocarditis and small vegetation. MRI with discitis at L5-S1 level.  Assessment & Plan:   Principal Problem:   Staphylococcus aureus bacteremia with sepsis ([place]) Active Problems:   OSA (obstructive sleep apnea)   S/P AVR (aortic valve replacement)   Hypokalemia   Elevated LFTs   Asthma, chronic, moderate persistent, uncomplicated   Normocytic anemia   Thrombocytopenia (HCC)   HTN (hypertension)   Back pain   Sepsis. -Presented with temperature of 101.3, respiratory rate of 33 and presence of bacteremia. -Patient given IV fluids, blood pressure was soft on admission. -Sepsis physiology resolved, continue current antibiotics. Patient on ancef for MSSA infection. Appears to be secondary to Lumbar discitis.   MSSA bacteremia with endocarditis  -presumed to be secondary to Lumbar discitis  -TEE with small vegetation seen -discussed with ID; planning 6 weeks of IV Ancef and Oral Rifampin  -PICC line to be placed either today or tomorrow  Acute encephalopathy -Likely acute metabolic encephalopathy secondary to sepsis. -This is resolved patient is back to his baseline.  Hypokalemia:  -Presented with potassium of 2.9, repleted  with oral supplements. -will monitor and replete as needed   Elevated LFTs:  -Possibly related to sepsis -Ultrasound showed fatty infiltration of his liver.  OSA on CPAP:  -Continue home CPAP  St. Jude AVR:  -Continue warfarin, dosed per pharmacy, INR goal 2.5-3.5 -given use of rifampin anticipate need to adjust home coumadin dose  Anemia:  -This is new. No evidence of bleeding.  -Trend hemoglobin  Asthma: -Continue Symbicort -Albuterol when necessary -good air movement and no wheezing   Hypertension: -Hypotensive on admission -BP stable now -continue monitoring off antihypertensive agents currently  Thrombocytopenia -Platelets was 143 on admission, went down to 82, this is likely secondary to sepsis. -improved 218 today.   DVT prophylaxis: On full anticoagulation with warfarin.  Code Status: Full Code Family Communication: Wife by Phone Disposition Plan: To be determine. Will assess PT and OT capability for safety. Diet: Diet Heart Room service appropriate? Yes; Fluid consistency: Thin  Consultants:   None  Procedures:   None  Antimicrobials:   Ancef and Rifampin   Objective: Vitals:   08/24/16 1410 08/24/16 1420 08/24/16 1500 08/24/16 1618  BP:    (!) 144/76  Pulse: 88 84  76  Resp: 18 18    Temp:  (!) 100.9 F (38.3 C) 98.8 F (37.1 C) 98.1 F (36.7 C)  TempSrc:  Oral Oral   SpO2: 95% 95%  99%  Weight:      Height:        Intake/Output Summary (Last 24 hours) at 08/24/16 1944 Last data filed at 08/24/16 1408  Gross per 24 hour  Intake  200 ml  Output              925 ml  Net             -725 ml   Filed Weights   08/17/16 2048  Weight: 115.7 kg (255 lb)    Examination: General exam: Appears calm and comfortable. Denies CP and SOB. Patient spike fever after TEE. Respiratory system: Clear to auscultation. Respiratory effort normal. Cardiovascular system: S1 & S2 heard, RRR. No JVD, positive soft SEM and metallic  click, NO rubs, NO gallops. Gastrointestinal system: Abdomen is nondistended, soft and nontender. No organomegaly or masses felt. Normal bowel sounds heard. Central nervous system: Alert and oriented. No focal neurological deficits. Extremities: Symmetric 4 x 5 power. Skin: No rashes, lesions or ulcers Psychiatry: Judgement and insight appear normal. Mood & affect appropriate.   Data Reviewed: I have personally reviewed following labs and imaging studies  CBC:  Recent Labs Lab 08/17/16 2104 08/18/16 0351 08/19/16 0403 08/20/16 0535 08/21/16 0607 08/24/16 0541  WBC 12.4* 11.3* 4.0 4.4 4.6 7.0  NEUTROABS 11.9*  --   --   --   --   --   HGB 11.5* 12.0* 11.2* 11.3* 11.0* 10.0*  HCT 34.8* 36.1* 34.3* 33.7* 32.5* 30.2*  MCV 84.1 86.0 86.4 84.3 84.4 83.4  PLT 143* 142* 82* 97* 108* [id number]   Basic Metabolic Panel:  Recent Labs Lab 08/19/16 0403 08/20/16 0535 08/21/16 0607 08/22/16 0455 08/24/16 0541  NA 136 136 138 136 136  K 3.5 3.6 3.6 3.4* 4.4  CL 109 105 107 104 104  CO2 22 26 24 22 26   GLUCOSE 129* 114* 111* 99 114*  BUN 14 11 22* 21* 13  CREATININE 0.99 0.78 0.79 0.71 0.74  CALCIUM 7.9* 8.4* 8.7* 8.4* 8.7*   GFR: Estimated Creatinine Clearance: 127.3 mL/min (by C-G formula based on SCr of 0.74 mg/dL).   Liver Function Tests:  Recent Labs Lab 08/17/16 2104 08/18/16 0351 08/20/16 0535  AST 72* 100* 68*  ALT 59 77* 69*  ALKPHOS 62 63 109  BILITOT 1.6* 1.8* 1.3*  PROT 6.4* 6.4* 6.2*  ALBUMIN 3.8 3.5 3.1*   Coagulation Profile:  Recent Labs Lab 08/20/16 0535 08/21/16 0607 08/22/16 0455 08/23/16 0506 08/24/16 0541  INR 2.36 2.68 3.53 3.48 2.33   Urine analysis:    Component Value Date/Time   COLORURINE YELLOW 08/18/2016 0044   APPEARANCEUR HAZY (A) 08/18/2016 0044   LABSPEC 1.019 08/18/2016 0044   PHURINE 5.0 08/18/2016 0044   GLUCOSEU NEGATIVE 08/18/2016 0044   HGBUR MODERATE (A) 08/18/2016 0044   BILIRUBINUR NEGATIVE 08/18/2016 [place] 08/18/2016 0044   PROTEINUR 30 (A) 08/18/2016 0044   NITRITE NEGATIVE 08/18/2016 0044   LEUKOCYTESUR NEGATIVE 08/18/2016 0044   Sepsis Labs: Recent Results (from the past 240 hour(s))  Culture, blood (Routine x 2)     Status: Abnormal   Collection Time: 08/17/16  9:05 PM  Result Value Ref Range Status   Specimen Description BLOOD RIGHT ANTECUBITAL  Final   Special Requests BOTTLES DRAWN AEROBIC AND ANAEROBIC 5 CC  Final   Culture  Setup Time   Final    GRAM POSITIVE COCCI IN CLUSTERS IN BOTH AEROBIC AND ANAEROBIC BOTTLES CRITICAL RESULT CALLED TO, READ BACK BY AND VERIFIED WITH: Christean Grief Pharm.D. 13:00 08/18/16  ([email or web address])    Culture (A)  Final    STAPHYLOCOCCUS AUREUS SUSCEPTIBILITIES PERFORMED ON PREVIOUS CULTURE WITHIN THE LAST 5 DAYS. Performed at [facility], [place] [address], [place], [place] [address]    Report Status 08/20/2016 FINAL  Final  Culture, blood (Routine x 2)     Status: Abnormal   Collection Time: 08/17/16  9:05 PM  Result Value Ref Range Status   Specimen Description BLOOD LEFT HAND  Final   Special Requests BOTTLES DRAWN AEROBIC AND ANAEROBIC 5 CC  Final   Culture  Setup Time   Final    GRAM POSITIVE COCCI IN CLUSTERS IN BOTH AEROBIC AND ANAEROBIC BOTTLES CRITICAL VALUE NOTED.  VALUE IS CONSISTENT WITH PREVIOUSLY REPORTED AND CALLED VALUE. Performed at [facility], [place] [address], [place], [place] [address]    Culture STAPHYLOCOCCUS AUREUS (A)  Final   Report Status 08/20/2016 FINAL  Final   Organism ID, Bacteria STAPHYLOCOCCUS AUREUS  Final      Susceptibility   Staphylococcus aureus - MIC*    CIPROFLOXACIN <=0.5 SENSITIVE Sensitive     ERYTHROMYCIN 0.5 SENSITIVE Sensitive     GENTAMICIN <=0.5 SENSITIVE Sensitive     OXACILLIN 0.5 SENSITIVE Sensitive     TETRACYCLINE <=1 SENSITIVE Sensitive     VANCOMYCIN 1 SENSITIVE Sensitive     TRIMETH/SULFA <=10 SENSITIVE Sensitive     CLINDAMYCIN <=0.25 SENSITIVE Sensitive     RIFAMPIN  <=0.5 SENSITIVE Sensitive     Inducible Clindamycin NEGATIVE Sensitive     * STAPHYLOCOCCUS AUREUS  Blood Culture ID Panel (Reflexed)     Status: Abnormal   Collection Time: 08/17/16  9:05 PM  Result Value Ref Range Status   Enterococcus species NOT DETECTED NOT DETECTED Final   Listeria monocytogenes NOT DETECTED NOT DETECTED Final   Staphylococcus species DETECTED (A) NOT DETECTED Final    Comment: CRITICAL RESULT CALLED TO, READ BACK BY AND VERIFIED WITH: Mila Merry.D. 13:00 08/18/16 ([email or web address])    Staphylococcus aureus DETECTED (A) NOT DETECTED Final    Comment: Methicillin (oxacillin) susceptible Staphylococcus aureus (MSSA). Preferred therapy is anti staphylococcal beta lactam antibiotic (Cefazolin or Nafcillin), unless clinically contraindicated. CRITICAL RESULT CALLED TO, READ BACK BY AND VERIFIED WITH: Christean Grief Pharm.D. 13:00 08/18/16 ([email or web address])    Methicillin resistance NOT DETECTED NOT DETECTED Final   Streptococcus species NOT DETECTED NOT DETECTED Final   Streptococcus agalactiae NOT DETECTED NOT DETECTED Final   Streptococcus pneumoniae NOT DETECTED NOT DETECTED Final   Streptococcus pyogenes NOT DETECTED NOT DETECTED Final   Acinetobacter baumannii NOT DETECTED NOT DETECTED Final   Enterobacteriaceae species NOT DETECTED NOT DETECTED Final   Enterobacter cloacae complex NOT DETECTED NOT DETECTED Final   Escherichia coli NOT DETECTED NOT DETECTED Final   Klebsiella oxytoca NOT DETECTED NOT DETECTED Final   Klebsiella pneumoniae NOT DETECTED NOT DETECTED Final   Proteus species NOT DETECTED NOT DETECTED Final   Serratia marcescens NOT DETECTED NOT DETECTED Final   Haemophilus influenzae NOT DETECTED NOT DETECTED Final   Neisseria meningitidis NOT DETECTED NOT DETECTED Final   Pseudomonas aeruginosa NOT DETECTED NOT DETECTED Final   Candida albicans NOT DETECTED NOT DETECTED Final   Candida glabrata NOT DETECTED NOT DETECTED Final   Candida krusei NOT DETECTED NOT  DETECTED Final   Candida parapsilosis NOT DETECTED NOT DETECTED Final   Candida tropicalis NOT DETECTED NOT DETECTED Final    Comment: Performed at [facility], [address] [place], [place] [address]  Urine culture     Status: Abnormal   Collection Time: 08/18/16 12:52 AM  Result Value Ref Range Status   Specimen Description URINE,  CLEAN CATCH  Final   Special Requests NONE  Final   Culture (A)  Final    50,000 COLONIES/mL STAPHYLOCOCCUS SPECIES (COAGULASE NEGATIVE)   Report Status 08/21/2016 FINAL  Final   Organism ID, Bacteria STAPHYLOCOCCUS SPECIES (COAGULASE NEGATIVE) (A)  Final      Susceptibility   Staphylococcus species (coagulase negative) - MIC*    CIPROFLOXACIN <=0.5 SENSITIVE Sensitive     GENTAMICIN <=0.5 SENSITIVE Sensitive     NITROFURANTOIN <=16 SENSITIVE Sensitive     OXACILLIN >=4 RESISTANT Resistant     TETRACYCLINE 2 SENSITIVE Sensitive     VANCOMYCIN 2 SENSITIVE Sensitive     TRIMETH/SULFA <=10 SENSITIVE Sensitive     CLINDAMYCIN >=8 RESISTANT Resistant     RIFAMPIN <=0.5 SENSITIVE Sensitive     Inducible Clindamycin NEGATIVE Sensitive     * 50,000 COLONIES/mL STAPHYLOCOCCUS SPECIES (COAGULASE NEGATIVE)  MRSA PCR Screening     Status: None   Collection Time: 08/18/16  2:00 AM  Result Value Ref Range Status   MRSA by PCR NEGATIVE NEGATIVE Final    Comment:        The GeneXpert MRSA Assay (FDA approved for NASAL specimens only), is one component of a comprehensive MRSA colonization surveillance program. It is not intended to diagnose MRSA infection nor to guide or monitor treatment for MRSA infections.   Culture, blood (Routine X 2) w Reflex to ID Panel     Status: None   Collection Time: 08/19/16  7:56 AM  Result Value Ref Range Status   Specimen Description BLOOD RIGHT ANTECUBITAL  Final   Special Requests BOTTLES DRAWN AEROBIC AND ANAEROBIC 10CC  Final   Culture   Final    NO GROWTH 5 DAYS Performed at [facility], [place]  [address] [place], [place] [address]    Report Status 08/24/2016 FINAL  Final  Culture, blood (Routine X 2) w Reflex to ID Panel     Status: Abnormal   Collection Time: 08/19/16  8:01 AM  Result Value Ref Range Status   Specimen Description BLOOD RIGHT HAND  Final   Special Requests IN PEDIATRIC BOTTLE 3CC  Final   Culture  Setup Time   Final    GRAM POSITIVE COCCI IN CLUSTERS AEROBIC BOTTLE ONLY CRITICAL VALUE NOTED.  VALUE IS CONSISTENT WITH PREVIOUSLY REPORTED AND CALLED VALUE.    Culture (A)  Final    STAPHYLOCOCCUS AUREUS SUSCEPTIBILITIES PERFORMED ON PREVIOUS CULTURE WITHIN THE LAST 5 DAYS. Performed at [facility], [place] [address], [place], [place] [address]    Report Status 08/21/2016 FINAL  Final  Culture, blood (Routine X 2) w Reflex to ID Panel     Status: None (Preliminary result)   Collection Time: 08/20/16 10:30 AM  Result Value Ref Range Status   Specimen Description BLOOD RIGHT ARM  8 ML IN [facility] BOTTLE  Final   Special Requests Immunocompromised  Final   Culture   Final    NO GROWTH 4 DAYS Performed at [facility], [place] [address], [place],  [address]    Report Status PENDING  Incomplete  Culture, blood (Routine X 2) w Reflex to ID Panel     Status: None (Preliminary result)   Collection Time: 08/20/16 10:30 AM  Result Value Ref Range Status   Specimen Description BLOOD RIGHT HAND  3 ML IN YELLOW  Final   Special Requests Immunocompromised  Final   Culture   Final    NO GROWTH 4 DAYS  Performed at [facility], [place] [address] [place], [place] [address]    Report Status PENDING  Incomplete     Invalid input(s): PROCALCITONIN, LACTICACIDVEN   Radiology Studies: Dg Chest 2 View  Result Date: 08/23/2016 CLINICAL DATA:  Shortness of breath EXAM: CHEST  2 VIEW COMPARISON:  08/17/2016 FINDINGS: Post sternotomy changes. No acute infiltrate or effusion. Stable borderline cardiomegaly. No overt edema. No pneumothorax. IMPRESSION: No radiographic  evidence for acute cardiopulmonary abnormality Electronically Signed   By: Donavan Foil M.D.   On: 08/23/2016 21:24   Mr Lumbar Spine W Wo Contrast  Result Date: 08/23/2016 CLINICAL DATA:  61 y/o M; MSSA bacteremia with concern for lumbar spine infection. EXAM: MRI LUMBAR SPINE WITHOUT AND WITH CONTRAST TECHNIQUE: Multiplanar and multiecho pulse sequences of the lumbar spine were obtained without and with intravenous contrast. CONTRAST:  49mL MULTIHANCE GADOBENATE DIMEGLUMINE 529 MG/ML IV SOLN COMPARISON:  None. FINDINGS: Segmentation:  Standard. Alignment: Grade 1 L5-S1 anterolisthesis and chronic L5 pars defects. Vertebrae: Mild edema and enhancement of the L5-S1 disc space and endplates. Small bilateral L4-5 facet effusions without significant edema or enhancement in the facets. No evidence for acute fracture. Conus medullaris: Extends to the L1 level and appears normal. No epidural fluid collection or enhancement of the cauda equina. Paraspinal and other soft tissues: Negative. Disc levels: L1-2: Small disc bulge and mild facet hypertrophy with mild foraminal narrowing. No significant canal stenosis. L2-3: Small disc bulge and mild facet hypertrophy with mild foraminal narrowing. No significant canal stenosis. L3-4: Small disc bulge and mild facet hypertrophy with mild bilateral foraminal narrowing. No significant canal stenosis. Small right facet effusion. L4-5: Small disc bulge and moderate left-greater-than-right facet hypertrophy with mild left-greater-than-right foraminal narrowing. No significant canal stenosis. Small bilateral facet effusions. L5-S1: Anterolisthesis with uncovered disc bulge and moderate bilateral facet hypertrophy greater on the right. Mild left and moderate right foraminal narrowing. There is probable impingement of the right L5 nerve root in the right neural foramen (series 7, image 3). IMPRESSION: 1. Mild edema and enhancement at the L5-S1 disc space and endplates is likely  degenerative related to chronic L5 pars defects and grade 1 anterolisthesis. Early discitis osteomyelitis could have this appearance but is considered less likely. 2. Right C3-4 and bilateral C4-5 facet effusions, likely degenerative. 3. Lumbar spondylosis with multilevel mild foraminal narrowing. 4. At the right L5-S1 neural foramen there is moderate narrowing and probable impingement of the exiting L5 nerve root. Electronically Signed   By: Kristine Garbe M.D.   On: 08/23/2016 19:01    Scheduled Meds: .  ceFAZolin (ANCEF) IV  2 g Intravenous Q8H  . clonazePAM  0.25 mg Oral QHS  . ketorolac      . mometasone-formoterol  2 puff Inhalation BID  . pantoprazole  40 mg Oral Daily  . rifampin  300 mg Oral TID  . sodium chloride flush  3 mL Intravenous Q12H  . Warfarin - Pharmacist Dosing Inpatient   Does not apply q1800  . zolpidem  10 mg Oral QHS   Continuous Infusions: . [START ON 08/25/2016] sodium chloride       LOS: 6 days    Time spent: 30 minutes    Barton Dubois, MD [facility] Pager [phone]  If 7PM-7AM, please contact night-coverage [email or web address] Password [facility] 08/24/2016, 7:44 PM

## 2016-08-24 NOTE — Progress Notes (Signed)
Peripherally Inserted Central Catheter/Midline Placement  The IV Nurse has discussed with the patient and/or persons authorized to consent for the patient, the purpose of this procedure and the potential benefits and risks involved with this procedure.  The benefits include less needle sticks, lab draws from the catheter, and the patient may be discharged home with the catheter. Risks include, but not limited to, infection, bleeding, blood clot (thrombus formation), and puncture of an artery; nerve damage and irregular heartbeat and possibility to perform a PICC exchange if needed/ordered by physician.  Alternatives to this procedure were also discussed.  Bard Power PICC patient education guide, fact sheet on infection prevention and patient information card has been provided to patient /or left at bedside.    PICC/Midline Placement Documentation  PICC Single Lumen 08/24/16 PICC Right Cephalic 40 cm 0 cm (Active)  Indication for Insertion or Continuance of Line Home intravenous therapies (PICC only) 08/24/2016  9:15 PM  Exposed Catheter (cm) 0 cm 08/24/2016  9:15 PM  Site Assessment Clean;Dry;Intact 08/24/2016  9:15 PM  Line Status Blood return noted;Flushed;Saline locked 08/24/2016  9:15 PM  Dressing Type Transparent 08/24/2016  9:15 PM  Dressing Status Clean;Dry;Intact;Antimicrobial disc in place 08/24/2016  9:15 PM  Dressing Intervention New dressing 08/24/2016  9:15 PM  Dressing Change Due 08/31/16 08/24/2016  9:15 PM       Aldona Lento L 08/24/2016, 9:17 PM

## 2016-08-24 NOTE — Progress Notes (Signed)
Received back from [facility] via [facility]. Awake, alert and oriented. C/O pain in lower back but refuses prn pain med at this time. VSS. Eulas Post, RN

## 2016-08-24 NOTE — Progress Notes (Signed)
Patient post TEE, temp 101.8. PRN dose of tylenol PO 650mg  given. Procedural MD aware. Will continue to monitor patient and will recheck temp in 30 minutes.

## 2016-08-24 NOTE — Interval H&P Note (Signed)
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 The various methods of treatment have been discussed with the patient and family. After consideration of risks, benefits and other options for treatment, the patient has consented to  Procedure(s): Fluoroscopy Guidance (N/A) as a surgical intervention .  The patient's history has been reviewed, patient examined, no change in status, stable for surgery.  I have reviewed the patient's chart and labs.  Questions were answered to the patient's satisfaction.     Glenetta Hew

## 2016-08-24 NOTE — Progress Notes (Signed)
Patient ID: Jeffery Whitehead, male   DOB: April 08, 1956, 61 y.o.   MRN: [id number]          [facility] for Infectious Disease    Date of Admission:  08/17/2016   Day 7 cefazolin         He is currently out of his room for his transesophageal echocardiogram. His nurse reports that he is still having some low back pain. His MRI shows some mild edema and enhancement at L5-S1. I strongly suspect that he has early vertebral infection related to his recent MSSA bacteremia. There is no better explanation for his severe, acute back pain. His repeat blood cultures remain negative. PICC placement is scheduled for later this afternoon. He will need at least 6 weeks of IV antibiotic therapy. If he has evidence of endocarditis on his prosthetic aortic valve I will consider adding oral rifampin.         Michel Bickers, MD [facility] for Infectious [facility] [phone] pager   [phone] cell 08/24/2016, 11:30 AM

## 2016-08-24 NOTE — Progress Notes (Signed)
[place] for warfarin Indication: Mechanical Valve St. Jude  No Known Allergies  Patient Measurements: Height: 6' (182.9 cm) Weight: 255 lb (115.7 kg) IBW/kg (Calculated) : 77.6 Heparin Dosing Weight:   Vital Signs: Temp: 99.9 F (37.7 C) (02/21 0600) Temp Source: Oral (02/21 0600) BP: 148/87 (02/21 0600) Pulse Rate: 87 (02/21 0600)  Labs:  Recent Labs  08/22/16 0455 08/23/16 0506 08/24/16 0541  HGB  --   --  10.0*  HCT  --   --  30.2*  PLT  --   --  218  LABPROT 36.2* 35.8* 26.0*  INR 3.53 3.48 2.33  CREATININE 0.71  --  0.74    Estimated Creatinine Clearance: 127.3 mL/min (by C-G formula based on SCr of 0.74 mg/dL).    Assessment: Patient wth St. Jude aortic valve with goal INR 2.5-3.5 per prior anti-coag clinic notes.   Admitted with ALM and URI symptoms and sepsis.    Home dose warfarin 7.5mg  daily except 8mg  on wednesdays  08/24/2016  INR 2.33  CBC: (2/18) Hgb downtrend, currently at 10. plt wnl (218). No active bleeding.  Diet: regular, eating 100%  Drug-drug interactions: no major interactions.  Started on toradol 30mg  IV q8h prn (requesting ~8-10hr).  Based on age and concomitant warfarin, suggest reducing dose to 15mg  IV q8h PRN and use for shortest time possible.    Goal of Therapy:  INR 2.5-3.5   Plan:   Warfarin 7.5 mg today.   Daily INR; CBC at least q72 hr  Monitor for bleeding  Modena Morrow, pharmacy student

## 2016-08-24 NOTE — H&P (Signed)
    INTERVAL PROCEDURE H&P  History and Physical Interval Note:  08/24/2016 12:15 PM  Jeffery Whitehead has presented today for their planned procedure. The various methods of treatment have been discussed with the patient and family. After consideration of risks, benefits and other options for treatment, the patient has consented to the procedure.  The patients' outpatient history has been reviewed, patient examined, and no change in status from most recent office note within the past 30 days. I have reviewed the patients' chart and labs and will proceed as planned. Questions were answered to the patient's satisfaction.   Pixie Casino, MD, [facility] Attending Cardiologist [place] C Marcheta Horsey 08/24/2016, 12:15 PM

## 2016-08-24 NOTE — CV Procedure (Signed)
   Prosthetic Aortic Valve Fluoroscopy  The patient was placed supine on the Cath Lab table multiple fluoroscopic images were obtained in the straight AP, RAO cranial, LAO caudal and AP caudal views.  Both prosthetic leaflets appear to be fully mobile without significant obstruction noted.   No sedation was administered. The patient is stable and will be transported back to endoscopy.   Glenetta Hew, M.D., M.S. Interventional Cardiologist   Pager # [phone] Phone # [phone] [address] [place], [place] [address]

## 2016-08-25 ENCOUNTER — Inpatient Hospital Stay (HOSPITAL_COMMUNITY): Payer: 59

## 2016-08-25 ENCOUNTER — Encounter (HOSPITAL_COMMUNITY): Payer: Self-pay | Admitting: Cardiology

## 2016-08-25 DIAGNOSIS — B9561 Methicillin susceptible Staphylococcus aureus infection as the cause of diseases classified elsewhere: Secondary | ICD-10-CM

## 2016-08-25 DIAGNOSIS — Z95828 Presence of other vascular implants and grafts: Secondary | ICD-10-CM

## 2016-08-25 DIAGNOSIS — I330 Acute and subacute infective endocarditis: Secondary | ICD-10-CM

## 2016-08-25 DIAGNOSIS — R509 Fever, unspecified: Secondary | ICD-10-CM

## 2016-08-25 DIAGNOSIS — T826XXA Infection and inflammatory reaction due to cardiac valve prosthesis, initial encounter: Principal | ICD-10-CM

## 2016-08-25 DIAGNOSIS — D696 Thrombocytopenia, unspecified: Secondary | ICD-10-CM

## 2016-08-25 DIAGNOSIS — I38 Endocarditis, valve unspecified: Secondary | ICD-10-CM

## 2016-08-25 DIAGNOSIS — I159 Secondary hypertension, unspecified: Secondary | ICD-10-CM

## 2016-08-25 DIAGNOSIS — M4646 Discitis, unspecified, lumbar region: Secondary | ICD-10-CM

## 2016-08-25 DIAGNOSIS — Y712 Prosthetic and other implants, materials and accessory cardiovascular devices associated with adverse incidents: Secondary | ICD-10-CM

## 2016-08-25 LAB — URINALYSIS, ROUTINE W REFLEX MICROSCOPIC
Bilirubin Urine: NEGATIVE
GLUCOSE, UA: NEGATIVE mg/dL
KETONES UR: NEGATIVE mg/dL
LEUKOCYTES UA: NEGATIVE
Nitrite: NEGATIVE
PH: 5 (ref 5.0–8.0)
PROTEIN: NEGATIVE mg/dL
SQUAMOUS EPITHELIAL / LPF: NONE SEEN
Specific Gravity, Urine: 1.003 — ABNORMAL LOW (ref 1.005–1.030)

## 2016-08-25 LAB — BASIC METABOLIC PANEL
Anion gap: 7 (ref 5–15)
BUN: 16 mg/dL (ref 6–20)
CHLORIDE: 102 mmol/L (ref 101–111)
CO2: 27 mmol/L (ref 22–32)
Calcium: 8.5 mg/dL — ABNORMAL LOW (ref 8.9–10.3)
Creatinine, Ser: 0.77 mg/dL (ref 0.61–1.24)
GFR calc Af Amer: >60 mL/min (ref >60)
GFR calc non Af Amer: >60 mL/min (ref >60)
Glucose, Bld: 102 mg/dL — ABNORMAL HIGH (ref 65–99)
POTASSIUM: 4.3 mmol/L (ref 3.5–5.1)
SODIUM: 136 mmol/L (ref 135–145)

## 2016-08-25 LAB — CULTURE, BLOOD (ROUTINE X 2)
CULTURE: NO GROWTH
Culture: NO GROWTH

## 2016-08-25 LAB — CBC
HEMATOCRIT: 30.5 % — AB (ref 39.0–52.0)
HEMOGLOBIN: 10 g/dL — AB (ref 13.0–17.0)
MCH: 28.2 pg (ref 26.0–34.0)
MCHC: 32.8 g/dL (ref 30.0–36.0)
MCV: 85.9 fL (ref 78.0–100.0)
Platelets: 241 10*3/uL (ref 150–400)
RBC: 3.55 MIL/uL — AB (ref 4.22–5.81)
RDW: 14.3 % (ref 11.5–15.5)
WBC: 6.7 10*3/uL (ref 4.0–10.5)

## 2016-08-25 LAB — PROTIME-INR
INR: 1.91
PROTHROMBIN TIME: 22.1 s — AB (ref 11.4–15.2)

## 2016-08-25 MED ORDER — WARFARIN SODIUM 5 MG PO TABS
10.0000 mg | ORAL_TABLET | Freq: Once | ORAL | Status: AC
  Administered 2016-08-25: 10 mg via ORAL
  Filled 2016-08-25: qty 2

## 2016-08-25 MED ORDER — GENTAMICIN SULFATE 40 MG/ML IJ SOLN
3.0000 mg/kg | INTRAVENOUS | Status: DC
  Administered 2016-08-25 – 2016-08-26 (×2): 280 mg via INTRAVENOUS
  Filled 2016-08-25 (×2): qty 7

## 2016-08-25 MED ORDER — KETOROLAC TROMETHAMINE 30 MG/ML IJ SOLN
30.0000 mg | Freq: Four times a day (QID) | INTRAMUSCULAR | Status: DC | PRN
  Administered 2016-08-25 – 2016-08-26 (×4): 30 mg via INTRAVENOUS
  Filled 2016-08-25 (×4): qty 1

## 2016-08-25 MED ORDER — ENOXAPARIN SODIUM 120 MG/0.8ML ~~LOC~~ SOLN
115.0000 mg | Freq: Two times a day (BID) | SUBCUTANEOUS | Status: DC
  Administered 2016-08-25 – 2016-08-26 (×3): 115 mg via SUBCUTANEOUS
  Filled 2016-08-25 (×3): qty 0.8

## 2016-08-25 MED ORDER — FUROSEMIDE 10 MG/ML IJ SOLN
40.0000 mg | Freq: Every day | INTRAMUSCULAR | Status: AC
  Administered 2016-08-25 – 2016-08-26 (×2): 40 mg via INTRAVENOUS
  Filled 2016-08-25 (×2): qty 4

## 2016-08-25 NOTE — Care Management Note (Addendum)
Case Management Note  Patient Details  Name: Jeffery Whitehead MRN: [id number] Date of Birth: January 16, 1956  Subjective/Objective: Noted PT recc CIR/SNF-I have contacted CIR coordinator-they will screen case. [facility] rep Pam following for iv infusion therapy. [facility] rep Maudie Mercury following for [employer] abx instruction, & [facility] infusion therapy rep Pam following if d/c plan is for home w/HHC.                Action/Plan:d/c CIR   Expected Discharge Date:                  Expected Discharge Plan:  [facility]  In-House Referral:     Discharge planning Services  CM Consult  Post Acute Care Choice:  Home Health Choice offered to:  Patient  DME Arranged:    DME Agency:     HH Arranged:    [place] Agency:  [place]  Status of Service:  In process, will continue to follow  If discussed at Long Length of Stay Meetings, dates discussed:    Additional Comments:  Dessa Phi, RN 08/25/2016, 12:10 PM

## 2016-08-25 NOTE — Clinical Social Work Note (Signed)
Clinical Social Work Assessment  Patient Details  Name: Jeffery Whitehead MRN: [id number] Date of Birth: 1956-02-25  Date of referral:  08/25/16               Reason for consult:  Facility Placement                Permission sought to share information with:  Chartered certified accountant granted to share information::  Yes, Verbal Permission Granted  Name::        Agency::     Relationship::     Contact Information:     Housing/Transportation Living arrangements for the past 2 months:  Single Family Home Source of Information:  Patient Patient Interpreter Needed:  None Criminal Activity/Legal Involvement Pertinent to Current Situation/Hospitalization:  No - Comment as needed Significant Relationships:  Spouse Lives with:  Spouse Do you feel safe going back to the place where you live?  No Need for family participation in patient care:  Yes (Comment)  Care giving concerns:  CSW received consult for SNF placement.    Social Worker assessment / plan:  CSW spoke with patient who states that his wife is currently in [place] taking care of her mother, but will need  6 weeks of IV Ancef and Oral Rifampin. CSW sent information out to [facility] and will follow-up with SNF bed offers.   Employment status:  Retired Surveyor, [employer] Care PT Recommendations:  [place] / Referral to community resources:  [place]  Patient/Family's Response to care:    Patient/Family's Understanding of and Emotional Response to Diagnosis, Current Treatment, and Prognosis:    Emotional Assessment Appearance:    Attitude/Demeanor/Rapport:    Affect (typically observed):    Orientation:  Oriented to Self, Oriented to Place, Oriented to  Time, Oriented to Situation Alcohol / Substance use:    Psych involvement (Current and /or in the community):     Discharge Needs  Concerns to be addressed:    Readmission within the  last 30 days:    Current discharge risk:    Barriers to Discharge:      Jeffery Whitehead 08/25/2016, 4:38 PM

## 2016-08-25 NOTE — Progress Notes (Signed)
I agree with the assessment by Erling Conte RN.

## 2016-08-25 NOTE — Progress Notes (Addendum)
Patient ID: Jeffery Whitehead, male   DOB: [birth year], 61 y.o.   MRN: [id number]          [facility]  Date of Admission:  08/17/2016           Day 8 cefazolin  Principal Problem:   Staphylococcus aureus bacteremia with sepsis [facility]) Active Problems:   Prosthetic valve endocarditis (HCC)   Lumbar discitis   S/P AVR (aortic valve replacement)   OSA (obstructive sleep apnea)   Hypokalemia   Elevated LFTs   Asthma, chronic, moderate persistent, uncomplicated   Normocytic anemia   Thrombocytopenia (HCC)   HTN (hypertension)   .  ceFAZolin (ANCEF) IV  2 g Intravenous Q8H  . clonazePAM  0.25 mg Oral QHS  . enoxaparin (LOVENOX) injection  115 mg Subcutaneous BID  . furosemide  40 mg Intravenous Daily  . mometasone-formoterol  2 puff Inhalation BID  . pantoprazole  40 mg Oral Daily  . rifampin  300 mg Oral TID  . sodium chloride flush  3 mL Intravenous Q12H  . Warfarin - Pharmacist Dosing Inpatient   Does not apply q1800  . zolpidem  10 mg Oral QHS    SUBJECTIVE: His low back pain is unchanged. He is still having some dry cough.  Review of Systems: Review of Systems  Constitutional: Positive for chills, diaphoresis, fever and malaise/fatigue.  Respiratory: Positive for cough. Negative for sputum production and shortness of breath.   Cardiovascular: Negative for chest pain.  Gastrointestinal: Negative for abdominal pain, diarrhea, nausea and vomiting.  Musculoskeletal: Positive for back pain.    Past Medical History:  Diagnosis Date  . Cold   . Decreased anal sphincter tone   . Heart valve replaced    1983   [facility]  . Insomnia   . Shortness of breath dyspnea   . Sleep apnea    cpap  . Varicose vein of leg   . Varicose veins     Social History  Substance Use Topics  . Smoking status: Never Smoker  . Smokeless tobacco: Never Used  . Alcohol use 0.0 oz/week     Comment: wine at night    Family History  Problem Relation Age of Onset    . Heart disease Father    No Known Allergies  OBJECTIVE: Vitals:   08/25/16 0511 08/25/16 0637 08/25/16 0928 08/25/16 1418  BP: (!) 155/85   (!) 146/83  Pulse:    89  Resp: 18   18  Temp: 100 F (37.8 C) 98.3 F (36.8 C)  (!) 102 F (38.9 C)  TempSrc: Oral Oral  Oral  SpO2: 97%  96% 94%  Weight:      Height:       Body mass index is 34.58 kg/m.  Physical Exam  Constitutional: He is oriented to person, place, and time.  He looks weak and tired. He is sitting up in a chair.  Cardiovascular: Normal rate and regular rhythm.   No murmur heard. Pulmonary/Chest: Effort normal and breath sounds normal.  Abdominal: Soft. There is no tenderness.  Neurological: He is alert and oriented to person, place, and time.  Skin: No rash noted.  New right arm PICC.  Psychiatric: Mood and affect normal.    Lab Results Lab Results  Component Value Date   WBC 6.7 08/25/2016   HGB 10.0 (L) 08/25/2016   HCT 30.5 (L) 08/25/2016   MCV 85.9 08/25/2016   PLT 241 08/25/2016    Lab Results  Component Value Date   CREATININE 0.77 08/25/2016   BUN 16 08/25/2016   NA 136 08/25/2016   K 4.3 08/25/2016   CL 102 08/25/2016   CO2 27 08/25/2016    Lab Results  Component Value Date   ALT 69 (H) 08/20/2016   AST 68 (H) 08/20/2016   ALKPHOS 109 08/20/2016   BILITOT 1.3 (H) 08/20/2016     Microbiology: Recent Results (from the past 240 hour(s))  Culture, blood (Routine x 2)     Status: Abnormal   Collection Time: 08/17/16  9:05 PM  Result Value Ref Range Status   Specimen Description BLOOD RIGHT ANTECUBITAL  Final   Special Requests BOTTLES DRAWN AEROBIC AND ANAEROBIC 5 CC  Final   Culture  Setup Time   Final    GRAM POSITIVE COCCI IN CLUSTERS IN BOTH AEROBIC AND ANAEROBIC BOTTLES CRITICAL RESULT CALLED TO, READ BACK BY AND VERIFIED WITH: Christean Grief Pharm.D. 13:00 08/18/16  ([email or web address])    Culture (A)  Final    STAPHYLOCOCCUS AUREUS SUSCEPTIBILITIES PERFORMED ON PREVIOUS CULTURE WITHIN  THE LAST 5 DAYS. Performed at [facility], [place] [address], [place], [place] [address]    Report Status 08/20/2016 FINAL  Final  Culture, blood (Routine x 2)     Status: Abnormal   Collection Time: 08/17/16  9:05 PM  Result Value Ref Range Status   Specimen Description BLOOD LEFT HAND  Final   Special Requests BOTTLES DRAWN AEROBIC AND ANAEROBIC 5 CC  Final   Culture  Setup Time   Final    GRAM POSITIVE COCCI IN CLUSTERS IN BOTH AEROBIC AND ANAEROBIC BOTTLES CRITICAL VALUE NOTED.  VALUE IS CONSISTENT WITH PREVIOUSLY REPORTED AND CALLED VALUE. Performed at [facility], [place] [address] [place], [place] [address]    Culture STAPHYLOCOCCUS AUREUS (A)  Final   Report Status 08/20/2016 FINAL  Final   Organism ID, Bacteria STAPHYLOCOCCUS AUREUS  Final      Susceptibility   Staphylococcus aureus - MIC*    CIPROFLOXACIN <=0.5 SENSITIVE Sensitive     ERYTHROMYCIN 0.5 SENSITIVE Sensitive     GENTAMICIN <=0.5 SENSITIVE Sensitive     OXACILLIN 0.5 SENSITIVE Sensitive     TETRACYCLINE <=1 SENSITIVE Sensitive     VANCOMYCIN 1 SENSITIVE Sensitive     TRIMETH/SULFA <=10 SENSITIVE Sensitive     CLINDAMYCIN <=0.25 SENSITIVE Sensitive     RIFAMPIN <=0.5 SENSITIVE Sensitive     Inducible Clindamycin NEGATIVE Sensitive     * STAPHYLOCOCCUS AUREUS  Blood Culture ID Panel (Reflexed)     Status: Abnormal   Collection Time: 08/17/16  9:05 PM  Result Value Ref Range Status   Enterococcus species NOT DETECTED NOT DETECTED Final   Listeria monocytogenes NOT DETECTED NOT DETECTED Final   Staphylococcus species DETECTED (A) NOT DETECTED Final    Comment: CRITICAL RESULT CALLED TO, READ BACK BY AND VERIFIED WITH: Mila Merry.D. 13:00 08/18/16 ([email or web address])    Staphylococcus aureus DETECTED (A) NOT DETECTED Final    Comment: Methicillin (oxacillin) susceptible Staphylococcus aureus (MSSA). Preferred therapy is anti staphylococcal beta lactam antibiotic (Cefazolin or Nafcillin), unless clinically  contraindicated. CRITICAL RESULT CALLED TO, READ BACK BY AND VERIFIED WITH: Christean Grief Pharm.D. 13:00 08/18/16 ([email or web address])    Methicillin resistance NOT DETECTED NOT DETECTED Final   Streptococcus species NOT DETECTED NOT DETECTED Final   Streptococcus agalactiae NOT DETECTED NOT DETECTED Final   Streptococcus pneumoniae NOT DETECTED NOT DETECTED Final   Streptococcus pyogenes NOT DETECTED NOT DETECTED  Final   Acinetobacter baumannii NOT DETECTED NOT DETECTED Final   Enterobacteriaceae species NOT DETECTED NOT DETECTED Final   Enterobacter cloacae complex NOT DETECTED NOT DETECTED Final   Escherichia coli NOT DETECTED NOT DETECTED Final   Klebsiella oxytoca NOT DETECTED NOT DETECTED Final   Klebsiella pneumoniae NOT DETECTED NOT DETECTED Final   Proteus species NOT DETECTED NOT DETECTED Final   Serratia marcescens NOT DETECTED NOT DETECTED Final   Haemophilus influenzae NOT DETECTED NOT DETECTED Final   Neisseria meningitidis NOT DETECTED NOT DETECTED Final   Pseudomonas aeruginosa NOT DETECTED NOT DETECTED Final   Candida albicans NOT DETECTED NOT DETECTED Final   Candida glabrata NOT DETECTED NOT DETECTED Final   Candida krusei NOT DETECTED NOT DETECTED Final   Candida parapsilosis NOT DETECTED NOT DETECTED Final   Candida tropicalis NOT DETECTED NOT DETECTED Final    Comment: Performed at [facility], [place] [address] [place], [place] [address]  Urine culture     Status: Abnormal   Collection Time: 08/18/16 12:52 AM  Result Value Ref Range Status   Specimen Description URINE, CLEAN CATCH  Final   Special Requests NONE  Final   Culture (A)  Final    50,000 COLONIES/mL STAPHYLOCOCCUS SPECIES (COAGULASE NEGATIVE)   Report Status 08/21/2016 FINAL  Final   Organism ID, Bacteria STAPHYLOCOCCUS SPECIES (COAGULASE NEGATIVE) (A)  Final      Susceptibility   Staphylococcus species (coagulase negative) - MIC*    CIPROFLOXACIN <=0.5 SENSITIVE Sensitive     GENTAMICIN <=0.5 SENSITIVE  Sensitive     NITROFURANTOIN <=16 SENSITIVE Sensitive     OXACILLIN >=4 RESISTANT Resistant     TETRACYCLINE 2 SENSITIVE Sensitive     VANCOMYCIN 2 SENSITIVE Sensitive     TRIMETH/SULFA <=10 SENSITIVE Sensitive     CLINDAMYCIN >=8 RESISTANT Resistant     RIFAMPIN <=0.5 SENSITIVE Sensitive     Inducible Clindamycin NEGATIVE Sensitive     * 50,000 COLONIES/mL STAPHYLOCOCCUS SPECIES (COAGULASE NEGATIVE)  MRSA PCR Screening     Status: None   Collection Time: 08/18/16  2:00 AM  Result Value Ref Range Status   MRSA by PCR NEGATIVE NEGATIVE Final    Comment:        The GeneXpert MRSA Assay (FDA approved for NASAL specimens only), is one component of a comprehensive MRSA colonization surveillance program. It is not intended to diagnose MRSA infection nor to guide or monitor treatment for MRSA infections.   Culture, blood (Routine X 2) w Reflex to ID Panel     Status: None   Collection Time: 08/19/16  7:56 AM  Result Value Ref Range Status   Specimen Description BLOOD RIGHT ANTECUBITAL  Final   Special Requests BOTTLES DRAWN AEROBIC AND ANAEROBIC 10CC  Final   Culture   Final    NO GROWTH 5 DAYS Performed at [facility], [place] [address] [place], [place] [address]    Report Status 08/24/2016 FINAL  Final  Culture, blood (Routine X 2) w Reflex to ID Panel     Status: Abnormal   Collection Time: 08/19/16  8:01 AM  Result Value Ref Range Status   Specimen Description BLOOD RIGHT HAND  Final   Special Requests IN PEDIATRIC BOTTLE 3CC  Final   Culture  Setup Time   Final    GRAM POSITIVE COCCI IN CLUSTERS AEROBIC BOTTLE ONLY CRITICAL VALUE NOTED.  VALUE IS CONSISTENT WITH PREVIOUSLY REPORTED AND CALLED VALUE.    Culture (A)  Final  STAPHYLOCOCCUS AUREUS SUSCEPTIBILITIES PERFORMED ON PREVIOUS CULTURE WITHIN THE LAST 5 DAYS. Performed at [facility], [place] [address] [place], [place] [address]    Report Status 08/21/2016 FINAL  Final  Culture, blood (Routine X 2) w  Reflex to ID Panel     Status: None   Collection Time: 08/20/16 10:30 AM  Result Value Ref Range Status   Specimen Description BLOOD RIGHT ARM  8 ML IN [facility] BOTTLE  Final   Special Requests Immunocompromised  Final   Culture   Final    NO GROWTH 5 DAYS Performed at [facility], [place] [address], [place], [place] [address]    Report Status 08/25/2016 FINAL  Final  Culture, blood (Routine X 2) w Reflex to ID Panel     Status: None   Collection Time: 08/20/16 10:30 AM  Result Value Ref Range Status   Specimen Description BLOOD RIGHT HAND  3 ML IN YELLOW  Final   Special Requests Immunocompromised  Final   Culture   Final    NO GROWTH 5 DAYS Performed at [facility], [place] [address] [place],  [address]    Report Status 08/25/2016 FINAL  Final     ASSESSMENT: He has MSSA bacteremia complicated by early lumbar infection and a prosthetic aortic valve endocarditis. He is now having some recurrent fevers after his TEE yesterday. If the fevers continue I will repeat his blood cultures. Because of the vegetation seen on his bioprosthetic valve I will add IV gentamicin. Problems guidelines call for the addition of oral rifampin as well but because of the drug drug interaction with warfarin I will not add it at this time.Marland Kitchen He will need a minimum of 6 weeks of antibiotic therapy.  PLAN: 1. Continue cefazolin 2. Start IV gentamicin  3. Check sedimentation rate and C-reactive protein 4. Repeat blood cultures if fevers persist  Michel Bickers, MD [facility] [place] [phone] pager   [phone] cell 08/25/2016, 4:59 PM

## 2016-08-25 NOTE — Progress Notes (Signed)
PROGRESS NOTE  Jeffery Whitehead  [id number] DOB: 10-06-1955 DOA: 08/17/2016 PCP: Charolette Forward, PA-C  Subjective: Continues to complain about low back pain, MRI with concerns for discitis on L5-S1 region. Had TEE suggesting endocarditis and small vegetation on prosthetic valve. No CP, no SOB.  Brief Narrative:  Jeffery Whitehead is a 61 y.o. 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 Patient reported fever, for some time, blood cultures showed MSSA, no known source for now. Await TEE and lumbar MRI to determine if there is infection in his mechanical aortic valve over his lumbar spine. PICC line to be placed later today. TEE with endocarditis and small vegetation. MRI with discitis at L5-S1 level.  Assessment & Plan:   Principal Problem:   Staphylococcus aureus bacteremia with sepsis ([place]) Active Problems:   OSA (obstructive sleep apnea)   S/P AVR (aortic valve replacement)   Hypokalemia   Elevated LFTs   Asthma, chronic, moderate persistent, uncomplicated   Normocytic anemia   Thrombocytopenia (HCC)   HTN (hypertension)   Back pain   Sepsis (due to MSSA infection). -Presented with temperature of 101.3, respiratory rate of 33 and presence of bacteremia. -Patient given IV fluids, blood pressure was soft on admission. -Sepsis physiology resolved, continue current antibiotics. Patient on ancef for MSSA infection. Appears to be secondary to Lumbar discitis/endocarditis   MSSA bacteremia with endocarditis  -presumed to be secondary to Lumbar discitis  -TEE with small vegetation seen -discussed with ID; planning 6 weeks of IV Ancef and Oral Rifampin  -PICC line in place now.  Acute encephalopathy -Likely acute metabolic encephalopathy secondary to sepsis. -This is resolved patient is back to his baseline.  Hypokalemia:  -Presented with potassium of  2.9, repleted with oral supplements. -will monitor and replete as needed   Elevated LFTs:  -Possibly related to sepsis -Ultrasound showed fatty infiltration of his liver. -weight loss recommended   OSA on CPAP:  -Continue home CPAP  St. Jude AVR:  -Continue warfarin, dosed per pharmacy, INR goal 2.5-3.5 -given use of rifampin anticipate need to adjust home coumadin dose and use lovenox for bridging if needed   Anemia:  -This is new. No evidence of bleeding.  -Trend hemoglobin  Asthma: -Continue Symbicort -Albuterol when necessary -good air movement and no wheezing   Hypertension: -Hypotensive on admission -BP stable now -continue monitoring off antihypertensive agents currently  Thrombocytopenia -Platelets was 143 on admission, went down to 82, this is likely secondary to sepsis. -Back to normal range -Will monitor closely due to need for bridge   SOB/LE edema and Crackles on auscultation -Probably secondary to acute vascular congestion  -TED hoses ordered -Started on Lasix -will check CXR   DVT prophylaxis: On full anticoagulation with warfarin; given low INR today (2/22) adding lovenox bridging.  Code Status: Full Code Family Communication: Wife by Phone Disposition Plan: To be determine. Will follow assessment from PT and OT; to evaluate discharge plans. Diet: Diet Heart Room service appropriate? Yes; Fluid consistency: Thin  Consultants:   None  Procedures:   See below for x-ray reports  TEE: 2/21 - Possible small vegetation noted on the LVOT side of the   mechanical AVR with increased gradients, suggestive of   endocarditis. Adequate bileaflet motion was not apparent in this   study. Flouroscopy of the leaflets is recommended.  Antimicrobials:   Ancef and Rifampin  Objective: Vitals:   08/25/16 0511 08/25/16 0637 08/25/16 0928 08/25/16 1418  BP: (!) 155/85   (!) 146/83  Pulse:    89  Resp: 18   18  Temp: 100 F (37.8 C) 98.3 F  (36.8 C)  (!) 102 F (38.9 C)  TempSrc: Oral Oral  Oral  SpO2: 97%  96% 94%  Weight:      Height:        Intake/Output Summary (Last 24 hours) at 08/25/16 1448 Last data filed at 08/25/16 0600  Gross per 24 hour  Intake              200 ml  Output              550 ml  Net             -350 ml   Filed Weights   08/17/16 2048  Weight: 115.7 kg (255 lb)    Examination: General exam: Appears calm and comfortable. Denies CP. Patient with fever overnight and reported SOB' especially on exertion. Respiratory system: bibasilar fine crackles, no wheezing, good air movement otherwise. Cardiovascular system: S1 & S2 heard, RRR. No JVD, positive soft SEM and metallic click, NO rubs, NO gallops. Gastrointestinal system: Abdomen is nondistended, soft and nontender. No organomegaly or masses felt. Normal bowel sounds heard. Central nervous system: Alert and oriented. No focal neurological deficits. Extremities: Symmetric 4 x 5 power. Patient 1-2+ edema bilaterally. Skin: No rashes, lesions or ulcers Psychiatry: Judgement and insight appear normal. Mood & affect appropriate.   Data Reviewed: I have personally reviewed following labs and imaging studies  CBC:  Recent Labs Lab 08/19/16 0403 08/20/16 0535 08/21/16 0607 08/24/16 0541 08/25/16 0405  WBC 4.0 4.4 4.6 7.0 6.7  HGB 11.2* 11.3* 11.0* 10.0* 10.0*  HCT 34.3* 33.7* 32.5* 30.2* 30.5*  MCV 86.4 84.3 84.4 83.4 85.9  PLT 82* 97* 108* 218 [id number]   Basic Metabolic Panel:  Recent Labs Lab 08/20/16 0535 08/21/16 0607 08/22/16 0455 08/24/16 0541 08/25/16 0405  NA 136 138 136 136 136  K 3.6 3.6 3.4* 4.4 4.3  CL 105 107 104 104 102  CO2 26 24 22 26 27   GLUCOSE 114* 111* 99 114* 102*  BUN 11 22* 21* 13 16  CREATININE 0.78 0.79 0.71 0.74 0.77  CALCIUM 8.4* 8.7* 8.4* 8.7* 8.5*   GFR: Estimated Creatinine Clearance: 127.3 mL/min (by C-G formula based on SCr of 0.77 mg/dL).   Liver Function Tests:  Recent Labs Lab  08/20/16 0535  AST 68*  ALT 69*  ALKPHOS 109  BILITOT 1.3*  PROT 6.2*  ALBUMIN 3.1*   Coagulation Profile:  Recent Labs Lab 08/21/16 0607 08/22/16 0455 08/23/16 0506 08/24/16 0541 08/25/16 0405  INR 2.68 3.53 3.48 2.33 1.91   Urine analysis:    Component Value Date/Time   COLORURINE YELLOW 08/18/2016 0044   APPEARANCEUR HAZY (A) 08/18/2016 0044   LABSPEC 1.019 08/18/2016 0044   PHURINE 5.0 08/18/2016 0044   GLUCOSEU NEGATIVE 08/18/2016 0044   HGBUR MODERATE (A) 08/18/2016 0044   BILIRUBINUR NEGATIVE 08/18/2016 0044   KETONESUR NEGATIVE 08/18/2016 0044   PROTEINUR 30 (A) 08/18/2016 0044   NITRITE NEGATIVE 08/18/2016 0044   LEUKOCYTESUR NEGATIVE 08/18/2016 0044   Sepsis Labs: Recent Results (from the past 240 hour(s))  Culture, blood (Routine x 2)     Status: Abnormal   Collection Time: 08/17/16  9:05 PM  Result Value Ref Range Status   Specimen Description BLOOD RIGHT ANTECUBITAL  Final  Special Requests BOTTLES DRAWN AEROBIC AND ANAEROBIC 5 CC  Final   Culture  Setup Time   Final    GRAM POSITIVE COCCI IN CLUSTERS IN BOTH AEROBIC AND ANAEROBIC BOTTLES CRITICAL RESULT CALLED TO, READ BACK BY AND VERIFIED WITH: Christean Grief Pharm.D. 13:00 08/18/16  ([email or web address])    Culture (A)  Final    STAPHYLOCOCCUS AUREUS SUSCEPTIBILITIES PERFORMED ON PREVIOUS CULTURE WITHIN THE LAST 5 DAYS. Performed at [facility], [place] [address] [place], [place] [address]    Report Status 08/20/2016 FINAL  Final  Culture, blood (Routine x 2)     Status: Abnormal   Collection Time: 08/17/16  9:05 PM  Result Value Ref Range Status   Specimen Description BLOOD LEFT HAND  Final   Special Requests BOTTLES DRAWN AEROBIC AND ANAEROBIC 5 CC  Final   Culture  Setup Time   Final    GRAM POSITIVE COCCI IN CLUSTERS IN BOTH AEROBIC AND ANAEROBIC BOTTLES CRITICAL VALUE NOTED.  VALUE IS CONSISTENT WITH PREVIOUSLY REPORTED AND CALLED VALUE. Performed at [facility], [place] [address]  [place], [place] [address]    Culture STAPHYLOCOCCUS AUREUS (A)  Final   Report Status 08/20/2016 FINAL  Final   Organism ID, Bacteria STAPHYLOCOCCUS AUREUS  Final      Susceptibility   Staphylococcus aureus - MIC*    CIPROFLOXACIN <=0.5 SENSITIVE Sensitive     ERYTHROMYCIN 0.5 SENSITIVE Sensitive     GENTAMICIN <=0.5 SENSITIVE Sensitive     OXACILLIN 0.5 SENSITIVE Sensitive     TETRACYCLINE <=1 SENSITIVE Sensitive     VANCOMYCIN 1 SENSITIVE Sensitive     TRIMETH/SULFA <=10 SENSITIVE Sensitive     CLINDAMYCIN <=0.25 SENSITIVE Sensitive     RIFAMPIN <=0.5 SENSITIVE Sensitive     Inducible Clindamycin NEGATIVE Sensitive     * STAPHYLOCOCCUS AUREUS  Blood Culture ID Panel (Reflexed)     Status: Abnormal   Collection Time: 08/17/16  9:05 PM  Result Value Ref Range Status   Enterococcus species NOT DETECTED NOT DETECTED Final   Listeria monocytogenes NOT DETECTED NOT DETECTED Final   Staphylococcus species DETECTED (A) NOT DETECTED Final    Comment: CRITICAL RESULT CALLED TO, READ BACK BY AND VERIFIED WITH: Mila Merry.D. 13:00 08/18/16 ([email or web address])    Staphylococcus aureus DETECTED (A) NOT DETECTED Final    Comment: Methicillin (oxacillin) susceptible Staphylococcus aureus (MSSA). Preferred therapy is anti staphylococcal beta lactam antibiotic (Cefazolin or Nafcillin), unless clinically contraindicated. CRITICAL RESULT CALLED TO, READ BACK BY AND VERIFIED WITH: Christean Grief Pharm.D. 13:00 08/18/16 ([email or web address])    Methicillin resistance NOT DETECTED NOT DETECTED Final   Streptococcus species NOT DETECTED NOT DETECTED Final   Streptococcus agalactiae NOT DETECTED NOT DETECTED Final   Streptococcus pneumoniae NOT DETECTED NOT DETECTED Final   Streptococcus pyogenes NOT DETECTED NOT DETECTED Final   Acinetobacter baumannii NOT DETECTED NOT DETECTED Final   Enterobacteriaceae species NOT DETECTED NOT DETECTED Final   Enterobacter cloacae complex NOT DETECTED NOT DETECTED Final   Escherichia coli NOT  DETECTED NOT DETECTED Final   Klebsiella oxytoca NOT DETECTED NOT DETECTED Final   Klebsiella pneumoniae NOT DETECTED NOT DETECTED Final   Proteus species NOT DETECTED NOT DETECTED Final   Serratia marcescens NOT DETECTED NOT DETECTED Final   Haemophilus influenzae NOT DETECTED NOT DETECTED Final   Neisseria meningitidis NOT DETECTED NOT DETECTED Final   Pseudomonas aeruginosa NOT DETECTED NOT DETECTED Final   Candida albicans NOT DETECTED NOT DETECTED Final   Candida glabrata  NOT DETECTED NOT DETECTED Final   Candida krusei NOT DETECTED NOT DETECTED Final   Candida parapsilosis NOT DETECTED NOT DETECTED Final   Candida tropicalis NOT DETECTED NOT DETECTED Final    Comment: Performed at [facility], [place] [address] [place], [place] [address]  Urine culture     Status: Abnormal   Collection Time: 08/18/16 12:52 AM  Result Value Ref Range Status   Specimen Description URINE, CLEAN CATCH  Final   Special Requests NONE  Final   Culture (A)  Final    50,000 COLONIES/mL STAPHYLOCOCCUS SPECIES (COAGULASE NEGATIVE)   Report Status 08/21/2016 FINAL  Final   Organism ID, Bacteria STAPHYLOCOCCUS SPECIES (COAGULASE NEGATIVE) (A)  Final      Susceptibility   Staphylococcus species (coagulase negative) - MIC*    CIPROFLOXACIN <=0.5 SENSITIVE Sensitive     GENTAMICIN <=0.5 SENSITIVE Sensitive     NITROFURANTOIN <=16 SENSITIVE Sensitive     OXACILLIN >=4 RESISTANT Resistant     TETRACYCLINE 2 SENSITIVE Sensitive     VANCOMYCIN 2 SENSITIVE Sensitive     TRIMETH/SULFA <=10 SENSITIVE Sensitive     CLINDAMYCIN >=8 RESISTANT Resistant     RIFAMPIN <=0.5 SENSITIVE Sensitive     Inducible Clindamycin NEGATIVE Sensitive     * 50,000 COLONIES/mL STAPHYLOCOCCUS SPECIES (COAGULASE NEGATIVE)  MRSA PCR Screening     Status: None   Collection Time: 08/18/16  2:00 AM  Result Value Ref Range Status   MRSA by PCR NEGATIVE NEGATIVE Final    Comment:        The GeneXpert MRSA Assay (FDA approved for  NASAL specimens only), is one component of a comprehensive MRSA colonization surveillance program. It is not intended to diagnose MRSA infection nor to guide or monitor treatment for MRSA infections.   Culture, blood (Routine X 2) w Reflex to ID Panel     Status: None   Collection Time: 08/19/16  7:56 AM  Result Value Ref Range Status   Specimen Description BLOOD RIGHT ANTECUBITAL  Final   Special Requests BOTTLES DRAWN AEROBIC AND ANAEROBIC 10CC  Final   Culture   Final    NO GROWTH 5 DAYS Performed at [facility], [place] [address], [place], [place] [address]    Report Status 08/24/2016 FINAL  Final  Culture, blood (Routine X 2) w Reflex to ID Panel     Status: Abnormal   Collection Time: 08/19/16  8:01 AM  Result Value Ref Range Status   Specimen Description BLOOD RIGHT HAND  Final   Special Requests IN PEDIATRIC BOTTLE 3CC  Final   Culture  Setup Time   Final    GRAM POSITIVE COCCI IN CLUSTERS AEROBIC BOTTLE ONLY CRITICAL VALUE NOTED.  VALUE IS CONSISTENT WITH PREVIOUSLY REPORTED AND CALLED VALUE.    Culture (A)  Final    STAPHYLOCOCCUS AUREUS SUSCEPTIBILITIES PERFORMED ON PREVIOUS CULTURE WITHIN THE LAST 5 DAYS. Performed at [facility], [place] [address], [place],  [address]    Report Status 08/21/2016 FINAL  Final  Culture, blood (Routine X 2) w Reflex to ID Panel     Status: None (Preliminary result)   Collection Time: 08/20/16 10:30 AM  Result Value Ref Range Status   Specimen Description BLOOD RIGHT ARM  8 ML IN [facility] BOTTLE  Final   Special Requests Immunocompromised  Final   Culture   Final    NO GROWTH 4 DAYS Performed at [facility], [place],  [place] [address]    Report Status PENDING  Incomplete  Culture, blood (Routine X 2) w Reflex to ID Panel     Status: None (Preliminary result)   Collection Time: 08/20/16 10:30 AM  Result Value Ref Range Status   Specimen Description BLOOD RIGHT HAND  3 ML IN YELLOW  Final    Special Requests Immunocompromised  Final   Culture   Final    NO GROWTH 4 DAYS Performed at [facility], [place] [address], [place], [place] [address]    Report Status PENDING  Incomplete     Invalid input(s): PROCALCITONIN, LACTICACIDVEN   Radiology Studies: Dg Chest 2 View  Result Date: 08/23/2016 CLINICAL DATA:  Shortness of breath EXAM: CHEST  2 VIEW COMPARISON:  08/17/2016 FINDINGS: Post sternotomy changes. No acute infiltrate or effusion. Stable borderline cardiomegaly. No overt edema. No pneumothorax. IMPRESSION: No radiographic evidence for acute cardiopulmonary abnormality Electronically Signed   By: Donavan Foil M.D.   On: 08/23/2016 21:24   Mr Lumbar Spine W Wo Contrast  Result Date: 08/23/2016 CLINICAL DATA:  61 y/o M; MSSA bacteremia with concern for lumbar spine infection. EXAM: MRI LUMBAR SPINE WITHOUT AND WITH CONTRAST TECHNIQUE: Multiplanar and multiecho pulse sequences of the lumbar spine were obtained without and with intravenous contrast. CONTRAST:  43mL MULTIHANCE GADOBENATE DIMEGLUMINE 529 MG/ML IV SOLN COMPARISON:  None. FINDINGS: Segmentation:  Standard. Alignment: Grade 1 L5-S1 anterolisthesis and chronic L5 pars defects. Vertebrae: Mild edema and enhancement of the L5-S1 disc space and endplates. Small bilateral L4-5 facet effusions without significant edema or enhancement in the facets. No evidence for acute fracture. Conus medullaris: Extends to the L1 level and appears normal. No epidural fluid collection or enhancement of the cauda equina. Paraspinal and other soft tissues: Negative. Disc levels: L1-2: Small disc bulge and mild facet hypertrophy with mild foraminal narrowing. No significant canal stenosis. L2-3: Small disc bulge and mild facet hypertrophy with mild foraminal narrowing. No significant canal stenosis. L3-4: Small disc bulge and mild facet hypertrophy with mild bilateral foraminal narrowing. No significant canal stenosis. Small right facet effusion.  L4-5: Small disc bulge and moderate left-greater-than-right facet hypertrophy with mild left-greater-than-right foraminal narrowing. No significant canal stenosis. Small bilateral facet effusions. L5-S1: Anterolisthesis with uncovered disc bulge and moderate bilateral facet hypertrophy greater on the right. Mild left and moderate right foraminal narrowing. There is probable impingement of the right L5 nerve root in the right neural foramen (series 7, image 3). IMPRESSION: 1. Mild edema and enhancement at the L5-S1 disc space and endplates is likely degenerative related to chronic L5 pars defects and grade 1 anterolisthesis. Early discitis osteomyelitis could have this appearance but is considered less likely. 2. Right C3-4 and bilateral C4-5 facet effusions, likely degenerative. 3. Lumbar spondylosis with multilevel mild foraminal narrowing. 4. At the right L5-S1 neural foramen there is moderate narrowing and probable impingement of the exiting L5 nerve root. Electronically Signed   By: Kristine Garbe M.D.   On: 08/23/2016 19:01    Scheduled Meds: .  ceFAZolin (ANCEF) IV  2 g Intravenous Q8H  . clonazePAM  0.25 mg Oral QHS  . enoxaparin (LOVENOX) injection  115 mg Subcutaneous BID  . furosemide  40 mg Intravenous Daily  . mometasone-formoterol  2 puff Inhalation BID  . pantoprazole  40 mg Oral Daily  . rifampin  300 mg Oral TID  . sodium chloride flush  3 mL Intravenous Q12H  . warfarin  10 mg Oral Once  . Warfarin -  Pharmacist Dosing Inpatient   Does not apply q1800  . zolpidem  10 mg Oral QHS   Continuous Infusions: . sodium chloride       LOS: 7 days    Time spent: 30 minutes    Barton Dubois, MD [facility] Pager [phone]  If 7PM-7AM, please contact night-coverage [email or web address] Password [email or web address] 08/25/2016, 2:48 PM

## 2016-08-25 NOTE — Progress Notes (Signed)
[place] for Warfarin & Lovenox bridging Indication: Mechanical Valve St. Jude  No Known Allergies  Patient Measurements: Height: 6' (182.9 cm) Weight: 255 lb (115.7 kg) IBW/kg (Calculated) : 77.6  Vital Signs: Temp: 98.3 F (36.8 C) (02/22 0637) Temp Source: Oral (02/22 0637) BP: 155/85 (02/22 0511)  Labs:  Recent Labs  08/23/16 0506 08/24/16 0541 08/25/16 0405  HGB  --  10.0* 10.0*  HCT  --  30.2* 30.5*  PLT  --  218 241  LABPROT 35.8* 26.0* 22.1*  INR 3.48 2.33 1.91  CREATININE  --  0.74 0.77   Estimated Creatinine Clearance: 127.3 mL/min (by C-G formula based on SCr of 0.77 mg/dL).  Assessment: Patient wth St. Jude aortic valve with goal INR 2.5-3.5 per prior anti-coag clinic notes.   Admitted with ALM and URI symptoms and sepsis.   TEE 2/21: Vegetation noted AVR Home dose warfarin 7.5mg  daily except 8mg  on wednesdays  08/25/2016  INR decreased further to 1.91  CBC: (2/18) Hgb downtrend, currently at 10. plt wnl (218). No active bleeding.  Diet: regular, eating 100%  Drug-drug interactions: Rifampin from 2/21 - can increase Warfarin requirements  Started on toradol 30mg  IV q8h prn (requesting ~8-10hr). Patient c/o pain, continued 30mg  q6hr prn  Goal of Therapy:  INR 2.5-3.5   Plan:   Warfarin 10 mg today  Begin Lovenox 115mg  SQ q12 until INR back into therapeutic range (2.5-3.5)  Daily INR; CBC at least q72 hr  Monitor for bleeding  Minda Ditto PharmD Pager [phone] 08/25/2016, 12:22 PM

## 2016-08-25 NOTE — Clinical Social Work Placement (Signed)
   CLINICAL SOCIAL WORK PLACEMENT  NOTE  Date:  08/25/2016  Patient Details  Name: Jeffery Whitehead MRN: [id number] Date of Birth: 15-Nov-1955  Clinical Social Work is seeking post-discharge placement for this patient at the [place] level of care (*CSW will initial, date and re-position this form in  chart as items are completed):  Yes   Patient/family provided with [facility] Work Department's list of facilities offering this level of care within the geographic area requested by the patient (or if unable, by the patient's family).  Yes   Patient/family informed of their freedom to choose among providers that offer the needed level of care, that participate in Medicare, Medicaid or managed care program needed by the patient, have an available bed and are willing to accept the patient.  Yes   Patient/family informed of [facility]'s ownership interest in [facility] and [facility], as well as of the fact that they are under no obligation to receive care at these facilities.  PASRR submitted to EDS on 08/25/16     PASRR number received on 08/25/16     Existing PASRR number confirmed on       FL2 transmitted to all facilities in geographic area requested by pt/family on 08/25/16     FL2 transmitted to all facilities within larger geographic area on       Patient informed that his/her managed care company has contracts with or will negotiate with certain facilities, including the following:            Patient/family informed of bed offers received.  Patient chooses bed at       Physician recommends and patient chooses bed at      Patient to be transferred to   on  .  Patient to be transferred to facility by       Patient family notified on   of transfer.  Name of family member notified:        PHYSICIAN       Additional Comment:    _______________________________________________ Standley Brooking, LCSW 08/25/2016, 4:39 PM

## 2016-08-25 NOTE — Evaluation (Signed)
Physical Therapy Evaluation Patient Details Name: Jeffery Whitehead MRN: [id number] DOB: Apr 27, 1956 Today's Date: 08/25/2016   History of Present Illness  61 y.o. male with a past medical history significant for St Jude valve on warfarin, OSA on CPAP, persistent asthma, and HTN and admitted for sepsis, MSSA bacteremia with endocarditis, and MRI with discitis at L5-S1 level  Clinical Impression  Pt admitted with above diagnosis. Pt currently with functional limitations due to the deficits listed below (see PT Problem List).  Pt will benefit from skilled PT to increase their independence and safety with mobility to allow discharge to the venue listed below.  Pt reporting increased back pain more on right side of lumbar area and having increased difficulty tolerating mobility at this time.  Pt would benefit from CIR upon d/c, if not CIR, pt may need SNF.     Follow Up Recommendations CIR (if not CIR, pt may need SNF)    Equipment Recommendations  Rolling walker with 5" wheels    Recommendations for Other Services Rehab consult     Precautions / Restrictions Precautions Precautions: Back Precaution Comments: back for comfort      Mobility  Bed Mobility               General bed mobility comments: pt up in recliner on arrival, states getting OOB was effortful and painful earlier, demonstrated log roll technique   Transfers Overall transfer level: Needs assistance Equipment used: Rolling walker (2 wheeled) Transfers: Sit to/from Stand Sit to Stand: Min guard         General transfer comment: verbal cues for using LEs for transfer, very slow and effortful mobility -requires increased time  Ambulation/Gait Ambulation/Gait assistance: Min guard Ambulation Distance (Feet): 25 Feet Assistive device: Rolling walker (2 wheeled) Gait Pattern/deviations: Step-through pattern;Decreased stride length     General Gait Details: very slow speed, unable to tolerate distance due to  pain, distance to tolerance, pt reports he has typically has been holding hand rail during this admission however provided RW for better upper body support today.  Stairs            Wheelchair Mobility    Modified Rankin (Stroke Patients Only)       Balance                                             Pertinent Vitals/Pain Pain Assessment: Faces Faces Pain Scale: Hurts whole lot Pain Location: R sided back pain - lumbar area Pain Descriptors / Indicators: Discomfort;Grimacing;Guarding Pain Intervention(s): Limited activity within patient's tolerance;Monitored during session;Repositioned    Home Living Family/patient expects to be discharged to:: Private residence Living Arrangements: Spouse/significant other Available Help at Discharge: Family;Available PRN/intermittently Type of Home: House Home Access: Stairs to enter Entrance Stairs-Rails: None Entrance Stairs-Number of Steps: 3-4 Home Layout: Two level;Able to live on main level with bedroom/bathroom Home Equipment: None Additional Comments: [place] care of her mom, daughter could check in    Prior Function Level of Independence: Independent               Hand Dominance        Extremity/Trunk Assessment        Lower Extremity Assessment Lower Extremity Assessment: Generalized weakness (observed slow AROM, not MMT due to back pain)       Communication   Communication: No  difficulties  Cognition Arousal/Alertness: Awake/alert Behavior During Therapy: WFL for tasks assessed/performed Overall Cognitive Status: Within Functional Limits for tasks assessed                      General Comments      Exercises     Assessment/Plan    PT Assessment Patient needs continued PT services  PT Problem List Decreased strength;Decreased activity tolerance;Decreased knowledge of use of DME;Decreased mobility;Pain       PT Treatment Interventions DME  instruction;Gait training;Therapeutic activities;Functional mobility training;Therapeutic exercise;Patient/family education    PT Goals (Current goals can be found in the Care Plan section)  Acute Rehab PT Goals PT Goal Formulation: With patient Time For Goal Achievement: 09/08/16 Potential to Achieve Goals: Good    Frequency Min 3X/week   Barriers to discharge        Co-evaluation               End of Session   Activity Tolerance: Patient limited by pain Patient left: in chair;with call bell/phone within reach Nurse Communication: Mobility status PT Visit Diagnosis: Pain;Difficulty in walking, not elsewhere classified (R26.2) Pain - Right/Left: Right Pain - part of body:  (back)         Time: 1130-1151 PT Time Calculation (min) (ACUTE ONLY): 21 min   Charges:   PT Evaluation $PT Eval Moderate Complexity: 1 Procedure     PT G Codes:         Korey Arroyo,KATHrine E 08/25/2016, 12:09 PM Carmelia Bake, PT, DPT 08/25/2016 Pager: [phone]

## 2016-08-25 NOTE — NC FL2 (Signed)
[place] LEVEL OF CARE SCREENING TOOL     IDENTIFICATION  Patient Name: Jeffery Whitehead Birthdate: [birth year] Sex: male Admission Date (Current Location): 08/17/2016  [facility] and [place] Number:  Herbalist and Address:  [facility],  [place] [address] [place]      Provider Number: [id number]  Attending Physician Name and Address:  Barton Dubois, MD  Relative Name and Phone Number:       Current Level of Care: Hospital Recommended Level of Care: [place] Prior Approval Number:    Date Approved/Denied:   PASRR Number: [id number]  Discharge Plan: SNF    Current Diagnoses: Patient Active Problem List   Diagnosis Date Noted  . Back pain   . Staphylococcus aureus bacteremia with sepsis ([place]) 08/19/2016  . Thrombocytopenia ([place]) 08/19/2016  . HTN (hypertension) 08/19/2016  . Hypokalemia 08/18/2016  . Elevated LFTs 08/18/2016  . Asthma, chronic, moderate persistent, uncomplicated [id number]  . Normocytic anemia 08/18/2016  . S/P AVR (aortic valve replacement) 06/19/2014  . OSA (obstructive sleep apnea) 05/21/2014  . Varicose veins of leg with complications [id number]    Orientation RESPIRATION BLADDER Height & Weight     Self, Time, Situation, Place  Normal Continent Weight: 255 lb (115.7 kg) Height:  6' (182.9 cm)  BEHAVIORAL SYMPTOMS/MOOD NEUROLOGICAL BOWEL NUTRITION STATUS      Continent Diet (Heart)  AMBULATORY STATUS COMMUNICATION OF NEEDS Skin   Extensive Assist Verbally Normal                       Personal Care Assistance Level of Assistance  Bathing, Dressing Bathing Assistance: Limited assistance   Dressing Assistance: Limited assistance     Functional Limitations Info             SPECIAL CARE FACTORS FREQUENCY  OT (By licensed OT), PT (By licensed PT)     PT Frequency: 5 OT Frequency: 5            Contractures      Additional Factors Info  Code Status, Allergies  Code Status Info: Fullcode Allergies Info: NKDA           Current Medications (08/25/2016):  This is the current hospital active medication list Current Facility-Administered Medications  Medication Dose Route Frequency Provider Last Rate Last Dose  . 0.9 %  sodium chloride infusion  250 mL Intravenous PRN Pixie Casino, MD      . 0.9 %  sodium chloride infusion   Intravenous Continuous Pixie Casino, MD      . acetaminophen (TYLENOL) tablet 650 mg  650 mg Oral Q6H PRN Edwin Dada, MD   650 mg at 08/25/16 1425   Or  . acetaminophen (TYLENOL) suppository 650 mg  650 mg Rectal Q6H PRN Edwin Dada, MD      . albuterol (PROVENTIL) (2.5 MG/3ML) 0.083% nebulizer solution 2.5 mg  2.5 mg Nebulization Q2H PRN Edwin Dada, MD      . ceFAZolin (ANCEF) IVPB 2g/100 mL premix  2 g Intravenous Q8H Berton Mount, RPH   2 g at 08/25/16 0943  . clonazePAM (KLONOPIN) tablet 0.25 mg  0.25 mg Oral QHS Verlee Monte, MD   0.25 mg at 08/24/16 2233  . enoxaparin (LOVENOX) injection 115 mg  115 mg Subcutaneous BID Minda Ditto, RPH   115 mg at 08/25/16 1513  . furosemide (LASIX) injection 40 mg  40 mg Intravenous Daily Barton Dubois, MD  40 mg at 08/25/16 1513  . guaiFENesin-dextromethorphan (ROBITUSSIN DM) 100-10 MG/5ML syrup 5 mL  5 mL Oral Q4H PRN Verlee Monte, MD   5 mL at 08/24/16 1845  . HYDROcodone-acetaminophen (NORCO/VICODIN) 5-325 MG per tablet 1-2 tablet  1-2 tablet Oral Q6H PRN Verlee Monte, MD   1 tablet at 08/25/16 0425  . ketorolac (TORADOL) 30 MG/ML injection 30 mg  30 mg Intravenous Q6H PRN Barton Dubois, MD   30 mg at 08/25/16 1454  . methocarbamol (ROBAXIN) tablet 500 mg  500 mg Oral Q8H PRN Verlee Monte, MD   500 mg at 08/25/16 0425  . mometasone-formoterol (DULERA) 200-5 MCG/ACT inhaler 2 puff  2 puff Inhalation BID Edwin Dada, MD   2 puff at 08/25/16 [phone]  . ondansetron (ZOFRAN) tablet 4 mg  4 mg Oral Q6H PRN Edwin Dada, MD       Or   . ondansetron (ZOFRAN) injection 4 mg  4 mg Intravenous Q6H PRN Edwin Dada, MD   4 mg at 08/18/16 1609  . pantoprazole (PROTONIX) EC tablet 40 mg  40 mg Oral Daily Edwin Dada, MD   40 mg at 08/25/16 0943  . rifampin (RIFADIN) capsule 300 mg  300 mg Oral TID Barton Dubois, MD   300 mg at 08/25/16 1513  . sodium chloride (OCEAN) 0.65 % nasal spray 1 spray  1 spray Each Nare PRN Edwin Dada, MD      . sodium chloride flush (NS) 0.9 % injection 10-40 mL  10-40 mL Intracatheter PRN Barton Dubois, MD      . sodium chloride flush (NS) 0.9 % injection 3 mL  3 mL Intravenous Q12H Pixie Casino, MD   3 mL at 08/24/16 2235  . sodium chloride flush (NS) 0.9 % injection 3 mL  3 mL Intravenous PRN Pixie Casino, MD      . Warfarin - Pharmacist Dosing Inpatient   Does not apply [id number] Edwin Dada, MD      . zolpidem (AMBIEN) tablet 10 mg  10 mg Oral QHS Verlee Monte, MD   10 mg at 08/24/16 2233     Discharge Medications: Please see discharge summary for a list of discharge medications.  Relevant Imaging Results:  Relevant Lab Results:   Additional Information SSN: [id number]   6 weeks of IV Ancef and Oral Rifampin   Standley Brooking, LCSW

## 2016-08-25 NOTE — Progress Notes (Addendum)
Pharmacy Antibiotic Note  Jeffery Whitehead is a 61 y.o. male admitted on 08/17/2016 with sepsis.  Pharmacy has been consulted for Vancomycin and Zosyn dosing. Blood cultures grew MSSA and antibiotics de-escalated to cefazolin.  ID now adding Gentamicin for synergy per pharmacy and Rifampin with vegetation seen on TEE.   Today, 08/25/2016  D8 Ancef (Day 6 from first clear cultures)   Renal: SCr WNL  WBC WNL  Fever to 102 today, previously afebrile  Urine cx: 50K MR-CoNS - suspect colonization  Plan:  Ancef 2g IV q8 hr and Rifampin 300 mg PO tid per MD; dosing appropriate  Gentamicin 3 mg/kg (using ABW) q24 hr for synergy  Usual course is 2 weeks of gentamicin; would attempt to draw trough if at [facility] prior to discharge.  Watch for ototoxicity with concomitant furosemide (ordered x2 doses only)  Per IDSA IE guidelines, gent should be given close to beta lactam dose, so have timed along with 18:00 Ancef dose   Height: 6' (182.9 cm) Weight: 255 lb (115.7 kg) IBW/kg (Calculated) : 77.6  Temp (24hrs), Avg:99.7 F (37.6 C), Min:98.3 F (36.8 C), Max:102 F (38.9 C)   Recent Labs Lab 08/19/16 0403 08/20/16 0535 08/21/16 0607 08/22/16 0455 08/24/16 0541 08/25/16 0405  WBC 4.0 4.4 4.6  --  7.0 6.7  CREATININE 0.99 0.78 0.79 0.71 0.74 0.77    Estimated Creatinine Clearance: 127.3 mL/min (by C-G formula based on SCr of 0.77 mg/dL).    No Known Allergies  Antimicrobials this admission:  2/15 Vanc >> 2/15 2/15 Zosyn >> 2/15 2/15 Ancef >> 2/15 Tamiflu >> 2/16 2/22 Gent >>  Dose adjustments this admission:   Microbiology results:  2/14 BCx: MSSA S-gent 2/15 UCx: 50K MR-CoNS 2/15 MRSA PCR: neg 2/15 Influenza panel: neg 2/16 BCx: 1/2 MSSA 2/17 BCx: ngtd   Thank you for allowing pharmacy to be a part of this patient's care.  Reuel Boom, PharmD, BCPS Pager: [phone] 08/25/2016, 6:50 PM

## 2016-08-25 NOTE — Procedures (Signed)
Placed patient on CPAP for the night.  Patient is tolerating well at this time. 

## 2016-08-25 NOTE — Evaluation (Signed)
Occupational Therapy Evaluation Patient Details Name: Jeffery Whitehead MRN: [id number] DOB: 1956-05-13 Today's Date: 08/25/2016    History of Present Illness 61 y.o. male with a past medical history significant for St Jude valve on warfarin, OSA on CPAP, persistent asthma, and HTN and admitted for sepsis, MSSA bacteremia with endocarditis, and MRI with discitis at L5-S1 level   Clinical Impression   Pt was admitted for the above.  He will benefit from continued OT in acute.  Prior to hospitalization, pt was independent with all ADLs/IADLs.  He currently needs min guard assist for transfers and up to mod A for adls.  Educated on AE and practiced with this today.  He tried all activities; limited by pain.  Goals are for min guard to supervision in acute setting.  Pt only has intermittent assistance available (not daily) as his wife is out of state caring for her mother.  He will likely need SNF for rehab after acute stay    Follow Up Recommendations  SNF    Equipment Recommendations  3 in 1 bedside commode    Recommendations for Other Services       Precautions / Restrictions Precautions Precautions: Back Precaution Comments: back for comfort Restrictions Weight Bearing Restrictions: No      Mobility Bed Mobility               General bed mobility comments: pt up in recliner on arrival, states getting OOB was effortful and painful earlier, demonstrated log roll technique   Transfers Overall transfer level: Needs assistance Equipment used: Rolling walker (2 wheeled) Transfers: Sit to/from Stand Sit to Stand: Min guard         General transfer comment: cues to scoot forward, hand placement and keeping back as straight as possible when standing up/sitting down    Balance                                            ADL Overall ADL's : Needs assistance/impaired     Grooming: Set up;Sitting   Upper Body Bathing: Set up;Sitting   Lower Body  Bathing: Minimal assistance;Sit to/from stand;With adaptive equipment   Upper Body Dressing : Minimal assistance;Sitting   Lower Body Dressing: Moderate assistance;With adaptive equipment;Sit to/from stand   Toilet Transfer: Min guard;Ambulation;RW (chair)             General ADL Comments: educated on AE and pt practiced with reacher and sock aide (with min A).  Educated on back precautions for comfort.  Pt did not want to get back into bed. Reports that he is much more comfortable in chair.  Pt wanted to take a short walk in the hall, so we did this after assessment (min guard for safety)     Vision         Perception     Praxis      Pertinent Vitals/Pain Pain Assessment: 0-10 Pain Score: 7  Faces Pain Scale: Hurts whole lot Pain Location: back and headache Pain Descriptors / Indicators: Discomfort;Grimacing;Guarding Pain Intervention(s): Limited activity within patient's tolerance;Monitored during session;Repositioned;Patient requesting pain meds-RN notified;Heat applied     Hand Dominance     Extremity/Trunk Assessment Upper Extremity Assessment Upper Extremity Assessment: Overall WFL for tasks assessed          Communication Communication Communication: No difficulties   Cognition Arousal/Alertness: Awake/alert Behavior During Therapy: WFL for tasks  assessed/performed Overall Cognitive Status: Within Functional Limits for tasks assessed                     General Comments       Exercises       Shoulder Instructions      Home Living Family/patient expects to be discharged to:: Private residence Living Arrangements: Spouse/significant other Available Help at Discharge: Family;Available PRN/intermittently Type of Home: House Home Access: Stairs to enter [employer] of Steps: 3-4 Entrance Stairs-Rails: None Home Layout: Two level;Able to live on main level with bedroom/bathroom         Bathroom Toilet: Standard     Home  Equipment: None   Additional Comments: spouse has been in [place] taking care of her mother. Daughter could stop by periodically--has a lot going on      Prior Functioning/Environment Level of Independence: Independent                 OT Problem List: Decreased strength;Decreased activity tolerance;Decreased knowledge of use of DME or AE;Decreased knowledge of precautions;Pain      OT Treatment/Interventions: Self-care/ADL training;DME and/or AE instruction;Patient/family education;Therapeutic activities    OT Goals(Current goals can be found in the care plan section) Acute Rehab OT Goals Patient Stated Goal: decreased pain; return to independence OT Goal Formulation: With patient Time For Goal Achievement: 09/01/16 Potential to Achieve Goals: Good ADL Goals Pt Will Perform Lower Body Bathing: sit to/from stand;with adaptive equipment;with supervision Pt Will Perform Lower Body Dressing: with adaptive equipment;sit to/from stand;with supervision Pt Will Transfer to Toilet: with supervision;ambulating;bedside commode Pt Will Perform Toileting - Clothing Manipulation and hygiene: with supervision;sit to/from stand Additional ADL Goal #1: pt will perform bed mobility at min guard level in preparation for adls  OT Frequency: Min 2X/week   Barriers to D/C:            Co-evaluation              End of Session    Activity Tolerance: Patient limited by pain Patient left: in chair;with call bell/phone within reach  OT Visit Diagnosis: Pain Pain - part of body:  (back)                ADL either performed or assessed with clinical judgement  Time: 1347-1410 OT Time Calculation (min): 23 min Charges:  OT General Charges $OT Visit: 1 Procedure OT Evaluation $OT Eval Moderate Complexity: 1 Procedure G-Codes:     [place], OTR/L [id number] 08/25/2016  Jeffery Whitehead 08/25/2016, 3:25 PM

## 2016-08-25 NOTE — Progress Notes (Signed)
Inpatient Rehabilitation  Per PT request patient was screened by Gunnar Fusi for appropriateness for an Inpatient Acute Rehab consult.  Note that patient is Min guard for transfers and gait; as a result, it is unlikely that [facility] Medicare would authorize and IP Rehab stay.  At this time we are recommending [place] if patient has 24/7 care otherwise recommend SNF level of post acute rehab.  Notified Juliann Pulse, RNCM.  Please call with questions.   Carmelia Roller., CCC/SLP Admission Coordinator  [place]  Cell [phone]

## 2016-08-26 ENCOUNTER — Inpatient Hospital Stay
Admission: RE | Admit: 2016-08-26 | Discharge: 2016-09-15 | Disposition: A | Discharge status: Home / Self Care | Payer: 59 | Source: Ambulatory Visit | Attending: Internal Medicine | Admitting: Internal Medicine

## 2016-08-26 DIAGNOSIS — T826XXD Infection and inflammatory reaction due to cardiac valve prosthesis, subsequent encounter: Secondary | ICD-10-CM

## 2016-08-26 DIAGNOSIS — I38 Endocarditis, valve unspecified: Principal | ICD-10-CM

## 2016-08-26 DIAGNOSIS — R2681 Unsteadiness on feet: Secondary | ICD-10-CM

## 2016-08-26 LAB — C-REACTIVE PROTEIN: CRP: 17.3 mg/dL — AB (ref <1.0)

## 2016-08-26 LAB — PROTIME-INR
INR: 2.62
PROTHROMBIN TIME: 28.6 s — AB (ref 11.4–15.2)

## 2016-08-26 LAB — SEDIMENTATION RATE: Sed Rate: 72 mm/hr — ABNORMAL HIGH (ref 0–16)

## 2016-08-26 MED ORDER — HEPARIN SOD (PORK) LOCK FLUSH 100 UNIT/ML IV SOLN
250.0000 [IU] | INTRAVENOUS | Status: AC | PRN
  Administered 2016-08-26: 250 [IU]

## 2016-08-26 MED ORDER — GENTAMICIN SULFATE 40 MG/ML IJ SOLN: 3.0000 mg/kg | INTRAVENOUS | 0 refills | Status: DC

## 2016-08-26 MED ORDER — OXYCODONE HCL 5 MG PO TABS: 5.0000 mg | ORAL_TABLET | Freq: Four times a day (QID) | ORAL | 0 refills | Status: DC | PRN

## 2016-08-26 MED ORDER — KETOROLAC TROMETHAMINE 10 MG PO TABS: 15.0000 mg | ORAL_TABLET | Freq: Four times a day (QID) | ORAL | 0 refills | Status: DC | PRN

## 2016-08-26 MED ORDER — ACETAMINOPHEN 325 MG PO TABS: 650.0000 mg | ORAL_TABLET | Freq: Four times a day (QID) | ORAL | Status: DC | PRN

## 2016-08-26 MED ORDER — METHOCARBAMOL 500 MG PO TABS: 500.0000 mg | ORAL_TABLET | Freq: Three times a day (TID) | ORAL | Status: DC | PRN

## 2016-08-26 MED ORDER — WARFARIN SODIUM 5 MG PO TABS
10.0000 mg | ORAL_TABLET | Freq: Once | ORAL | Status: AC
  Administered 2016-08-26: 10 mg via ORAL
  Filled 2016-08-26: qty 2

## 2016-08-26 MED ORDER — CEFAZOLIN SODIUM-DEXTROSE 2-4 GM/100ML-% IV SOLN
2.0000 g | Freq: Three times a day (TID) | INTRAVENOUS | 0 refills | Status: AC
Start: 2016-08-26 — End: 2016-09-28

## 2016-08-26 NOTE — Progress Notes (Signed)
[place] for Warfarin  Indication: Mechanical Valve St. Jude  No Known Allergies  Patient Measurements: Height: 6' (182.9 cm) Weight: 255 lb (115.7 kg) IBW/kg (Calculated) : 77.6  Vital Signs: Temp: 98.1 F (36.7 C) (02/23 0555) Temp Source: Oral (02/23 0555) BP: 112/96 (02/23 0555) Pulse Rate: 73 (02/23 0555)  Labs:  Recent Labs  08/24/16 0541 08/25/16 0405 08/26/16 0400  HGB 10.0* 10.0*  --   HCT 30.2* 30.5*  --   PLT 218 241  --   LABPROT 26.0* 22.1* 28.6*  INR 2.33 1.91 2.62  CREATININE 0.74 0.77  --    Estimated Creatinine Clearance: 127.3 mL/min (by C-G formula based on SCr of 0.77 mg/dL).  Assessment: Patient wth St. Jude aortic valve with goal INR 2.5-3.5 per prior anti-coag clinic notes.   Admitted with ALM and URI symptoms and sepsis.   TEE 2/21: Vegetation noted AVR Home dose warfarin 7.5mg  daily except 8mg  on wednesdays  08/26/2016  INR back into therapeutic range  CBC: (2/18) Hgb downtrend, currently at 10. plt wnl (218). No active bleeding.  Diet: regular, eating 100%  Drug-drug interactions: Rifampin from 2/21 - can increase Warfarin requirements-discontinued today. Effect may be seen for 2-3 days  Started on toradol 30mg  IV q8h prn (requesting ~8-10hr). Patient c/o pain, continued 30mg  q6hr prn  Goal of Therapy:  INR 2.5-3.5   Plan:   Warfarin 10 mg today, then consider resuming home Warfarin dose  Discontiue Lovenox    Daily INR; CBC at least q72 hr  Monitor for bleeding  Minda Ditto PharmD Pager [phone] 08/26/2016, 11:08 AM

## 2016-08-26 NOTE — Progress Notes (Signed)
OT Cancellation Note  Patient Details Name: Jeffery Whitehead MRN: [id number] DOB: Feb 02, 1956   Cancelled Treatment:    Reason Eval/Treat Not Completed: Other (comment).  Attempted twice:  Pt eating then RN was with him.  Will try to check back Monday.  Jonesha Tsuchiya 08/26/2016, 3:40 PM  Lesle Chris, OTR/L [phone] 08/26/2016

## 2016-08-26 NOTE — Discharge Summary (Signed)
Physician Discharge Summary  Jeffery Whitehead [id number] DOB: 20-Oct-1955 DOA: 08/17/2016  PCP: Charolette Forward, PA-C  Admit date: 08/17/2016 Discharge date: 08/26/2016  Time spent: 35 minutes  Recommendations for Outpatient Follow-up:  BMET twice a week while using Gentamycin and then once a week CBC weekly; please fax reports to ID center [phone]) Check INR and adjust coumadin as needed (goal is 2.5-3.5)  Discharge Diagnoses:  Principal Problem:   Staphylococcus aureus bacteremia with sepsis ([place]) Active Problems:   OSA (obstructive sleep apnea)   S/P AVR (aortic valve replacement)   Hypokalemia   Elevated LFTs   Asthma, chronic, moderate persistent, uncomplicated   Normocytic anemia   Thrombocytopenia (HCC)   HTN (hypertension)   Prosthetic valve endocarditis (HCC)   Lumbar discitis   Fever   Unsteady gait   Discharge Condition: stable and improved. Will discharge to SNF for rehabilitation and conditioning. Outpatient follow up with ID and continue use of IV antibiotics as instructed.  Diet recommendation: heart healthy and low calorie diet   Filed Weights   08/17/16 2048  Weight: 115.7 kg (255 lb)    History of present illness:  As per Dr. Loleta Books on  H&P written on 08/17/16  61 y.o. 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  The patient was in his usual state of health until a few days ago when he developed URI symptoms of sinus congestion, fever.  Over the last two days this has progressed to generalized malaise, body aches, fever, chills, sore throat and headache.  Today, his wife called home in the evening to check on him and he seemed confused and slurred to her so she called 9-1-1.  EMS gave 1500 IVF en route and found him with temp 104F.  There has been no cough, sputum production, neck stiffness.  There has been no dysuria,  urinary symptoms.  On arrival, he was somewhat altered, moderate hypotension.  Hospital Course:  Sepsis (due to MSSA infection). -Presented with temperature of 101.3, respiratory rate of 33 and presence of bacteremia. -Patient given IV fluids, blood pressure was soft on admission. -Sepsis physiology resolved, continue current antibiotics. Patient on ancef and gentamicin for MSSA infection. Appears to be secondary to Lumbar discitis/endocarditis   MSSA bacteremia with endocarditis  -presumed to be secondary to Lumbar discitis  -TEE with small vegetation seen -discussed with ID; planning 6 weeks of IV Ancef and 2 weeks of IV gentamicin   -PICC line in place now. -will be follow on 09/27/16 at ID offices; keep line in place until evaluated by ID physician   Acute encephalopathy -Likely acute metabolic encephalopathy secondary to sepsis. -This is resolved patient is back to his baseline.  Hypokalemia: -Presented with potassium of 2.9 on admission; repleted with oral supplements. -will recommend BMET to monitor trend and replete as needed   Elevated LFTs: -Possibly related to sepsis -Ultrasound showed fatty infiltration of his liver. -weight loss recommended  -LFT's trending down  OSA on CPAP: -Continue home CPAP  St. Jude AVR: -Continue warfarin, dosed per pharmacy, INR goal 2.5-3.5 -close follow up of INR recommended given antibiotics usage and potential fluctuation on his level  Anemia: -This is new. No evidence of bleeding appreciated.  -Trend hemoglobin with CBC weekly -Hgb 10.0 at discharge  Asthma: -Continue Symbicort -Albuterol when necessary -good air movement and no wheezing at discharge  Hypertension: -Hypotensive on  admission -BP stable and rising -will resume home antihypertensive regimen at discharge -heart healthy diet recommended   Thrombocytopenia -Platelets was 143 on admission, went down to 82, this was likely secondary to  sepsis. -Back to normal range at dsicharge -Will recommend CBC weekly to monitor trend   SOB/LE edema and Crackles on auscultation -Probably secondary to acute vascular congestion  -TED hoses ordered 12 hours on and 12 off -received lasix X 2 doses -at discharge just trace edema on his legs and no crackles -CXR on 2/22 (mild vascular congestion; but no acute cardiopulmonary process  Procedures:  See below for x-ray reports  TEE: 2/21 - Possible small vegetation noted on the LVOT side of the mechanical AVR with increased gradients, suggestive of endocarditis. Adequate bileaflet motion was not apparent in this study. Flouroscopy of the leaflets is recommended.  Consultations:  ID  Discharge Exam: Vitals:   08/26/16 0555 08/26/16 1335  BP: (!) 112/96 131/86  Pulse: 73 77  Resp: 18 20  Temp: 98.1 F (36.7 C) 99.1 F (37.3 C)   General exam: Appears calm and comfortable. Denies CP or SOB today. Patient has now remained afebrile.feeling better overall. Respiratory system: no crackles, good air movement, no wheezing and no rhonchi. Cardiovascular system: S1 & S2 heard, RRR. No JVD, positive soft SEM and metallic click, NO rubs, NO gallops. Gastrointestinal system: Abdomen is nondistended, soft and nontender. No organomegaly or masses felt. Normal bowel sounds heard. Central nervous system: Alert and oriented. No focal neurological deficits. Extremities: Symmetric 4 x 5 power. Patient trace to 1+ edema bilaterally. Skin: No rashes, lesions or ulcers Psychiatry: Judgement and insight appear normal. Mood & affect appropriate.     Discharge Instructions   Discharge Instructions    Diet - low sodium heart healthy    Complete by:  As directed    Discharge instructions    Complete by:  As directed    Maintain adequate hydration  Please follow up with Dr. Megan Salon (ID) as instructed. 09/27/16 Take medications as prescribed BMET twice a week while using Gentamycin and  then once a week CBC weekly; please fax reports to ID center [phone]) Physical rehabilitation as per SNF protocol  Check INR and adjust coumadin as needed (goal is 2.5-3.5)     Current Discharge Medication List    START taking these medications   Details  acetaminophen (TYLENOL) 325 MG tablet Take 2 tablets (650 mg total) by mouth every 6 (six) hours as needed for mild pain, fever or headache (or Fever >/= 101).    ceFAZolin (ANCEF) 2-4 GM/100ML-% IVPB Inject 100 mLs (2 g total) into the vein every 8 (eight) hours. Qty: 9900 mL, Refills: 0    gentamicin 280 mg in dextrose 5 % 50 mL Inject 280 mg into the vein daily. Qty: 3920 mg, Refills: 0    ketorolac (TORADOL) 10 MG tablet Take 1.5 tablets (15 mg total) by mouth every 6 (six) hours as needed. Qty: 45 tablet, Refills: 0    methocarbamol (ROBAXIN) 500 MG tablet Take 1 tablet (500 mg total) by mouth every 8 (eight) hours as needed for muscle spasms.    oxyCODONE (ROXICODONE) 5 MG immediate release tablet Take 1 tablet (5 mg total) by mouth every 6 (six) hours as needed for severe pain. Qty: 20 tablet, Refills: 0      CONTINUE these medications which have NOT CHANGED   Details  budesonide-formoterol (SYMBICORT) 160-4.5 MCG/ACT inhaler Inhale 2 puffs into the lungs 2 times daily Qty:  1 Inhaler, Refills: 6    fluticasone (FLONASE) 50 MCG/ACT nasal spray Place 1 spray into both nostrils as needed for allergies or rhinitis (as needed for allergy symptoms).    lisinopril (PRINIVIL,ZESTRIL) 20 MG tablet Take 20 mg by mouth daily.    loratadine (CLARITIN) 10 MG tablet Take 10 mg by mouth daily as needed for allergies.     Multiple Vitamins-Minerals (CENTRUM SILVER ADULT 50+ PO) Take 1 tablet by mouth daily with breakfast.    omeprazole (PRILOSEC) 20 MG capsule Take 1 capsule by mouth daily.    SF 5000 PLUS 1.1 % CREA dental cream Place 1 application onto teeth at bedtime as needed (dental hygiene).     warfarin (COUMADIN)  7.5 MG tablet Take 7.5 mg by mouth as directed. Take 7.5 MG on Sunday, Monday, Tuesday, Thursday, Friday, Saturday. Take 8 MG on Wednesday    zolpidem (AMBIEN) 10 MG tablet Take 10 mg by mouth at bedtime.     albuterol (PROVENTIL HFA;VENTOLIN HFA) 108 (90 Base) MCG/ACT inhaler Inhale 2 puffs into the lungs every 6 (six) hours as needed for wheezing or shortness of breath. Qty: 1 Inhaler, Refills: 5       No Known Allergies  Contact information for follow-up providers    Michel Bickers, MD. Go on 09/27/2016.   Specialty:  Infectious Diseases Contact information: [address] [place] [address] [phone]            Contact information for after-discharge care    [facility] Follow up.   Specialty:  Skilled Nursing Facility Contact information: [address] [place] [phone]                  The results of significant diagnostics from this hospitalization (including imaging, microbiology, ancillary and laboratory) are listed below for reference.    Significant Diagnostic Studies: Dg Chest 2 View  Result Date: 08/25/2016 CLINICAL DATA:  61 y/o male inpatient with fever, SOB x1 week. Hx aortic valve replacement, HTN - on meds, non-smoker EXAM: CHEST - 2 VIEW COMPARISON:  08/23/2016 FINDINGS: Lungs clear.  Interval right PICC placement to distal SVC. Heart size and mediastinal contours are within normal limits. Tortuous thoracic aorta. No effusion. Previous median sternotomy and AVR. IMPRESSION: No acute disease post AVR. Electronically Signed   By: Lucrezia Europe M.D.   On: 08/25/2016 16:00   Dg Chest 2 View  Result Date: 08/23/2016 CLINICAL DATA:  Shortness of breath EXAM: CHEST  2 VIEW COMPARISON:  08/17/2016 FINDINGS: Post sternotomy changes. No acute infiltrate or effusion. Stable borderline cardiomegaly. No overt edema. No pneumothorax. IMPRESSION: No radiographic evidence for acute  cardiopulmonary abnormality Electronically Signed   By: Donavan Foil M.D.   On: 08/23/2016 21:24   Dg Chest 2 View  Result Date: 08/17/2016 CLINICAL DATA:  Body aches and fever EXAM: CHEST  2 VIEW COMPARISON:  None. FINDINGS: Cardiomediastinal silhouette is upper limits normal. No focal airspace consolidation or pulmonary edema. No pneumothorax or pleural effusion. IMPRESSION: No active cardiopulmonary disease. Electronically Signed   By: Ulyses Jarred M.D.   On: 08/17/2016 22:24   Ct Head Wo Contrast  Result Date: 08/17/2016 CLINICAL DATA:  Altered mental status and dizziness EXAM: CT HEAD WITHOUT CONTRAST TECHNIQUE: Contiguous axial images were obtained from the base of the skull through the vertex without intravenous contrast. COMPARISON:  None. FINDINGS: Brain: No mass lesion, intraparenchymal hemorrhage or extra-axial collection. No  evidence of acute cortical infarct. Brain parenchyma and CSF-containing spaces are normal for age. Vascular: No hyperdense vessel or unexpected calcification. Skull: Normal visualized skull base, calvarium and extracranial soft tissues. Sinuses/Orbits: No sinus fluid levels or advanced mucosal thickening. No mastoid effusion. Normal orbits. IMPRESSION: Normal CT of the brain. Electronically Signed   By: Ulyses Jarred M.D.   On: 08/17/2016 22:03   Mr Lumbar Spine W Wo Contrast  Result Date: 08/23/2016 CLINICAL DATA:  61 y/o M; MSSA bacteremia with concern for lumbar spine infection. EXAM: MRI LUMBAR SPINE WITHOUT AND WITH CONTRAST TECHNIQUE: Multiplanar and multiecho pulse sequences of the lumbar spine were obtained without and with intravenous contrast. CONTRAST:  59mL MULTIHANCE GADOBENATE DIMEGLUMINE 529 MG/ML IV SOLN COMPARISON:  None. FINDINGS: Segmentation:  Standard. Alignment: Grade 1 L5-S1 anterolisthesis and chronic L5 pars defects. Vertebrae: Mild edema and enhancement of the L5-S1 disc space and endplates. Small bilateral L4-5 facet effusions without  significant edema or enhancement in the facets. No evidence for acute fracture. Conus medullaris: Extends to the L1 level and appears normal. No epidural fluid collection or enhancement of the cauda equina. Paraspinal and other soft tissues: Negative. Disc levels: L1-2: Small disc bulge and mild facet hypertrophy with mild foraminal narrowing. No significant canal stenosis. L2-3: Small disc bulge and mild facet hypertrophy with mild foraminal narrowing. No significant canal stenosis. L3-4: Small disc bulge and mild facet hypertrophy with mild bilateral foraminal narrowing. No significant canal stenosis. Small right facet effusion. L4-5: Small disc bulge and moderate left-greater-than-right facet hypertrophy with mild left-greater-than-right foraminal narrowing. No significant canal stenosis. Small bilateral facet effusions. L5-S1: Anterolisthesis with uncovered disc bulge and moderate bilateral facet hypertrophy greater on the right. Mild left and moderate right foraminal narrowing. There is probable impingement of the right L5 nerve root in the right neural foramen (series 7, image 3). IMPRESSION: 1. Mild edema and enhancement at the L5-S1 disc space and endplates is likely degenerative related to chronic L5 pars defects and grade 1 anterolisthesis. Early discitis osteomyelitis could have this appearance but is considered less likely. 2. Right C3-4 and bilateral C4-5 facet effusions, likely degenerative. 3. Lumbar spondylosis with multilevel mild foraminal narrowing. 4. At the right L5-S1 neural foramen there is moderate narrowing and probable impingement of the exiting L5 nerve root. Electronically Signed   By: Kristine Garbe M.D.   On: 08/23/2016 19:01   US Abdomen Limited Ruq  Result Date: 08/18/2016 CLINICAL DATA:  Elevated liver function tests. EXAM: US ABDOMEN LIMITED - RIGHT UPPER QUADRANT COMPARISON:  None. FINDINGS: Gallbladder: No gallstones or wall thickening visualized. No sonographic  Murphy sign noted by sonographer. Common bile duct: Diameter: 0.2 cm Liver: No focal lesion or intrahepatic biliary ductal dilatation. Diffusely increased echogenicity is seen. IMPRESSION: Fatty infiltration of the liver. The examination is otherwise negative. Electronically Signed   By: Inge Rise M.D.   On: 08/18/2016 15:58    Microbiology: Recent Results (from the past 240 hour(s))  Culture, blood (Routine x 2)     Status: Abnormal   Collection Time: 08/17/16  9:05 PM  Result Value Ref Range Status   Specimen Description BLOOD RIGHT ANTECUBITAL  Final   Special Requests BOTTLES DRAWN AEROBIC AND ANAEROBIC 5 CC  Final   Culture  Setup Time   Final    GRAM POSITIVE COCCI IN CLUSTERS IN BOTH AEROBIC AND ANAEROBIC BOTTLES CRITICAL RESULT CALLED TO, READ BACK BY AND VERIFIED WITH: Christean Grief Pharm.D. 13:00 08/18/16  ([email or web address])    Culture (  A)  Final    STAPHYLOCOCCUS AUREUS SUSCEPTIBILITIES PERFORMED ON PREVIOUS CULTURE WITHIN THE LAST 5 DAYS. Performed at [facility], [place] [address], [place], [place] [address]    Report Status 08/20/2016 FINAL  Final  Culture, blood (Routine x 2)     Status: Abnormal   Collection Time: 08/17/16  9:05 PM  Result Value Ref Range Status   Specimen Description BLOOD LEFT HAND  Final   Special Requests BOTTLES DRAWN AEROBIC AND ANAEROBIC 5 CC  Final   Culture  Setup Time   Final    GRAM POSITIVE COCCI IN CLUSTERS IN BOTH AEROBIC AND ANAEROBIC BOTTLES CRITICAL VALUE NOTED.  VALUE IS CONSISTENT WITH PREVIOUSLY REPORTED AND CALLED VALUE. Performed at [facility], [place] [address], [place], [place] [address]    Culture STAPHYLOCOCCUS AUREUS (A)  Final   Report Status 08/20/2016 FINAL  Final   Organism ID, Bacteria STAPHYLOCOCCUS AUREUS  Final      Susceptibility   Staphylococcus aureus - MIC*    CIPROFLOXACIN <=0.5 SENSITIVE Sensitive     ERYTHROMYCIN 0.5 SENSITIVE Sensitive     GENTAMICIN <=0.5 SENSITIVE Sensitive     OXACILLIN 0.5  SENSITIVE Sensitive     TETRACYCLINE <=1 SENSITIVE Sensitive     VANCOMYCIN 1 SENSITIVE Sensitive     TRIMETH/SULFA <=10 SENSITIVE Sensitive     CLINDAMYCIN <=0.25 SENSITIVE Sensitive     RIFAMPIN <=0.5 SENSITIVE Sensitive     Inducible Clindamycin NEGATIVE Sensitive     * STAPHYLOCOCCUS AUREUS  Blood Culture ID Panel (Reflexed)     Status: Abnormal   Collection Time: 08/17/16  9:05 PM  Result Value Ref Range Status   Enterococcus species NOT DETECTED NOT DETECTED Final   Listeria monocytogenes NOT DETECTED NOT DETECTED Final   Staphylococcus species DETECTED (A) NOT DETECTED Final    Comment: CRITICAL RESULT CALLED TO, READ BACK BY AND VERIFIED WITH: Mila Merry.D. 13:00 08/18/16 ([email or web address])    Staphylococcus aureus DETECTED (A) NOT DETECTED Final    Comment: Methicillin (oxacillin) susceptible Staphylococcus aureus (MSSA). Preferred therapy is anti staphylococcal beta lactam antibiotic (Cefazolin or Nafcillin), unless clinically contraindicated. CRITICAL RESULT CALLED TO, READ BACK BY AND VERIFIED WITH: Christean Grief Pharm.D. 13:00 08/18/16 ([email or web address])    Methicillin resistance NOT DETECTED NOT DETECTED Final   Streptococcus species NOT DETECTED NOT DETECTED Final   Streptococcus agalactiae NOT DETECTED NOT DETECTED Final   Streptococcus pneumoniae NOT DETECTED NOT DETECTED Final   Streptococcus pyogenes NOT DETECTED NOT DETECTED Final   Acinetobacter baumannii NOT DETECTED NOT DETECTED Final   Enterobacteriaceae species NOT DETECTED NOT DETECTED Final   Enterobacter cloacae complex NOT DETECTED NOT DETECTED Final   Escherichia coli NOT DETECTED NOT DETECTED Final   Klebsiella oxytoca NOT DETECTED NOT DETECTED Final   Klebsiella pneumoniae NOT DETECTED NOT DETECTED Final   Proteus species NOT DETECTED NOT DETECTED Final   Serratia marcescens NOT DETECTED NOT DETECTED Final   Haemophilus influenzae NOT DETECTED NOT DETECTED Final   Neisseria meningitidis NOT DETECTED NOT DETECTED Final    Pseudomonas aeruginosa NOT DETECTED NOT DETECTED Final   Candida albicans NOT DETECTED NOT DETECTED Final   Candida glabrata NOT DETECTED NOT DETECTED Final   Candida krusei NOT DETECTED NOT DETECTED Final   Candida parapsilosis NOT DETECTED NOT DETECTED Final   Candida tropicalis NOT DETECTED NOT DETECTED Final    Comment: Performed at [facility], [address] [place], [place] [address]  Urine culture  Status: Abnormal   Collection Time: 08/18/16 12:52 AM  Result Value Ref Range Status   Specimen Description URINE, CLEAN CATCH  Final   Special Requests NONE  Final   Culture (A)  Final    50,000 COLONIES/mL STAPHYLOCOCCUS SPECIES (COAGULASE NEGATIVE)   Report Status 08/21/2016 FINAL  Final   Organism ID, Bacteria STAPHYLOCOCCUS SPECIES (COAGULASE NEGATIVE) (A)  Final      Susceptibility   Staphylococcus species (coagulase negative) - MIC*    CIPROFLOXACIN <=0.5 SENSITIVE Sensitive     GENTAMICIN <=0.5 SENSITIVE Sensitive     NITROFURANTOIN <=16 SENSITIVE Sensitive     OXACILLIN >=4 RESISTANT Resistant     TETRACYCLINE 2 SENSITIVE Sensitive     VANCOMYCIN 2 SENSITIVE Sensitive     TRIMETH/SULFA <=10 SENSITIVE Sensitive     CLINDAMYCIN >=8 RESISTANT Resistant     RIFAMPIN <=0.5 SENSITIVE Sensitive     Inducible Clindamycin NEGATIVE Sensitive     * 50,000 COLONIES/mL STAPHYLOCOCCUS SPECIES (COAGULASE NEGATIVE)  MRSA PCR Screening     Status: None   Collection Time: 08/18/16  2:00 AM  Result Value Ref Range Status   MRSA by PCR NEGATIVE NEGATIVE Final    Comment:        The GeneXpert MRSA Assay (FDA approved for NASAL specimens only), is one component of a comprehensive MRSA colonization surveillance program. It is not intended to diagnose MRSA infection nor to guide or monitor treatment for MRSA infections.   Culture, blood (Routine X 2) w Reflex to ID Panel     Status: None   Collection Time: 08/19/16  7:56 AM  Result Value Ref Range Status   Specimen  Description BLOOD RIGHT ANTECUBITAL  Final   Special Requests BOTTLES DRAWN AEROBIC AND ANAEROBIC 10CC  Final   Culture   Final    NO GROWTH 5 DAYS Performed at [facility], [place] [address] [place], [place] [address]    Report Status 08/24/2016 FINAL  Final  Culture, blood (Routine X 2) w Reflex to ID Panel     Status: Abnormal   Collection Time: 08/19/16  8:01 AM  Result Value Ref Range Status   Specimen Description BLOOD RIGHT HAND  Final   Special Requests IN PEDIATRIC BOTTLE 3CC  Final   Culture  Setup Time   Final    GRAM POSITIVE COCCI IN CLUSTERS AEROBIC BOTTLE ONLY CRITICAL VALUE NOTED.  VALUE IS CONSISTENT WITH PREVIOUSLY REPORTED AND CALLED VALUE.    Culture (A)  Final    STAPHYLOCOCCUS AUREUS SUSCEPTIBILITIES PERFORMED ON PREVIOUS CULTURE WITHIN THE LAST 5 DAYS. Performed at [facility], [place] [address], [place], [place] [address]    Report Status 08/21/2016 FINAL  Final  Culture, blood (Routine X 2) w Reflex to ID Panel     Status: None   Collection Time: 08/20/16 10:30 AM  Result Value Ref Range Status   Specimen Description BLOOD RIGHT ARM  8 ML IN [facility] BOTTLE  Final   Special Requests Immunocompromised  Final   Culture   Final    NO GROWTH 5 DAYS Performed at [facility], [place] [address] [place], [place] [address]    Report Status 08/25/2016 FINAL  Final  Culture, blood (Routine X 2) w Reflex to ID Panel     Status: None   Collection Time: 08/20/16 10:30 AM  Result Value Ref Range Status   Specimen Description BLOOD RIGHT HAND  3 ML IN YELLOW  Final   Special  Requests Immunocompromised  Final   Culture   Final    NO GROWTH 5 DAYS Performed at [facility], [place] [address] [place], [place] [address]    Report Status 08/25/2016 FINAL  Final  Culture, blood (Routine X 2) w Reflex to ID Panel     Status: None (Preliminary result)   Collection Time: 08/25/16  8:56 PM  Result Value Ref Range Status   Specimen Description LEFT  ANTECUBITAL  Final   Special Requests BOTTLES DRAWN AEROBIC AND ANAEROBIC 5CC  Final   Culture   Final    NO GROWTH < 12 HOURS Performed at [facility], [place] [address] [place], [place] [address]    Report Status PENDING  Incomplete  Culture, blood (Routine X 2) w Reflex to ID Panel     Status: None (Preliminary result)   Collection Time: 08/25/16  8:59 PM  Result Value Ref Range Status   Specimen Description BLOOD LEFT ARM  Final   Special Requests IN PEDIATRIC BOTTLE 4CC  Final   Culture   Final    NO GROWTH < 12 HOURS Performed at [facility], [place] [address], [place],  [address]    Report Status PENDING  Incomplete     Labs: Basic Metabolic Panel:  Recent Labs Lab 08/20/16 0535 08/21/16 0607 08/22/16 0455 08/24/16 0541 08/25/16 0405  NA 136 138 136 136 136  K 3.6 3.6 3.4* 4.4 4.3  CL 105 107 104 104 102  CO2 26 24 22 26 27   GLUCOSE 114* 111* 99 114* 102*  BUN 11 22* 21* 13 16  CREATININE 0.78 0.79 0.71 0.74 0.77  CALCIUM 8.4* 8.7* 8.4* 8.7* 8.5*   Liver Function Tests:  Recent Labs Lab 08/20/16 0535  AST 68*  ALT 69*  ALKPHOS 109  BILITOT 1.3*  PROT 6.2*  ALBUMIN 3.1*   CBC:  Recent Labs Lab 08/20/16 0535 08/21/16 0607 08/24/16 0541 08/25/16 0405  WBC 4.4 4.6 7.0 6.7  HGB 11.3* 11.0* 10.0* 10.0*  HCT 33.7* 32.5* 30.2* 30.5*  MCV 84.3 84.4 83.4 85.9  PLT 97* 108* 218 241     Signed:  Barton Dubois MD.  [facility] 08/26/2016, 3:17 PM

## 2016-08-26 NOTE — Progress Notes (Addendum)
Report called to Santiago Glad, Therapist, sports at [facility]. All questions answered.  Pt will be transported to the facility via daughter.  Went over paperwork with patient and family.  PICC line hep locked by IV team per MD verbal order.  Discharge packet and discharge summary given to patient.

## 2016-08-26 NOTE — Clinical Social Work Placement (Signed)
Patient has accepted bed at [facility]. CSW confirmed with Marianna Fuss at [facility] that they would be able to take patient over the weekend if ready.   Please call weekend CSW (ph#: [phone]) to facilitate discharge.     Raynaldo Opitz, [facility] Clinical Social Worker cell #: [phone]     CLINICAL SOCIAL WORK PLACEMENT  NOTE  Date:  08/26/2016  Patient Details  Name: Rimas Bertot MRN: [id number] Date of Birth: [birth year]  Clinical Social Work is seeking post-discharge placement for this patient at the [place] level of care (*CSW will initial, date and re-position this form in  chart as items are completed):  Yes   Patient/family provided with [place] Work Department's list of facilities offering this level of care within the geographic area requested by the patient (or if unable, by the patient's family).  Yes   Patient/family informed of their freedom to choose among providers that offer the needed level of care, that participate in Medicare, Medicaid or managed care program needed by the patient, have an available bed and are willing to accept the patient.  Yes   Patient/family informed of [place]'s ownership interest in [facility] and [facility], as well as of the fact that they are under no obligation to receive care at these facilities.  PASRR submitted to EDS on 08/25/16     PASRR number received on 08/25/16     Existing PASRR number confirmed on       FL2 transmitted to all facilities in geographic area requested by pt/family on 08/25/16     FL2 transmitted to all facilities within larger geographic area on       Patient informed that his/her managed care company has contracts with or will negotiate with certain facilities, including the following:        Yes   Patient/family informed of bed offers received.  Patient chooses bed at [facility]     Physician  recommends and patient chooses bed at      Patient to be transferred to [facility] on  .  Patient to be transferred to facility by       Patient family notified on   of transfer.  Name of family member notified:        PHYSICIAN       Additional Comment:    _______________________________________________ Standley Brooking, LCSW 08/26/2016, 2:36 PM

## 2016-08-26 NOTE — Progress Notes (Signed)
Patient ID: Jeffery Whitehead, male   DOB: 10/30/1955, 61 y.o.   MRN: [id number]          [place] for Infectious Disease  Date of Admission:  08/17/2016           Day 9 cefazolin        Day 2 gentamicin  Principal Problem:   Staphylococcus aureus bacteremia with sepsis [facility]) Active Problems:   Prosthetic valve endocarditis (HCC)   Lumbar discitis   S/P AVR (aortic valve replacement)   OSA (obstructive sleep apnea)   Hypokalemia   Elevated LFTs   Asthma, chronic, moderate persistent, uncomplicated   Normocytic anemia   Thrombocytopenia (HCC)   HTN (hypertension)   Fever   .  ceFAZolin (ANCEF) IV  2 g Intravenous Q8H  . clonazePAM  0.25 mg Oral QHS  . gentamicin  3 mg/kg (Adjusted) Intravenous Q24H  . mometasone-formoterol  2 puff Inhalation BID  . pantoprazole  40 mg Oral Daily  . sodium chloride flush  3 mL Intravenous Q12H  . Warfarin - Pharmacist Dosing Inpatient   Does not apply q1800  . zolpidem  10 mg Oral QHS    SUBJECTIVE: He is still having some dry cough. His back pain is better today. He was able to walk in the hall today without much difficulty. He did have some sweats again last night.  Review of Systems: Review of Systems  Constitutional: Positive for chills, diaphoresis, fever and malaise/fatigue.  Respiratory: Positive for cough. Negative for sputum production and shortness of breath.   Cardiovascular: Negative for chest pain.  Gastrointestinal: Negative for abdominal pain, diarrhea, nausea and vomiting.  Musculoskeletal: Positive for back pain.    Past Medical History:  Diagnosis Date  . Cold   . Decreased anal sphincter tone   . Heart valve replaced    1983   [facility]  . Insomnia   . Shortness of breath dyspnea   . Sleep apnea    cpap  . Varicose vein of leg   . Varicose veins     Social History  Substance Use Topics  . Smoking status: Never Smoker  . Smokeless tobacco: Never Used  . Alcohol use 0.0 oz/week     Comment: wine at  night    Family History  Problem Relation Age of Onset  . Heart disease Father    No Known Allergies  OBJECTIVE: Vitals:   08/25/16 2230 08/25/16 2241 08/26/16 0555 08/26/16 0824  BP: 132/84  (!) 112/96   Pulse: 83 81 73   Resp: '20 18 18   ' Temp: 99.4 F (37.4 C)  98.1 F (36.7 C)   TempSrc: Oral  Oral   SpO2: 95% 94% 96% 97%  Weight:      Height:       Body mass index is 34.58 kg/m.  Physical Exam  Constitutional: He is oriented to person, place, and time.  He looked much more comfortable when up walking in the hall this morning. He is now sitting up in a chair in his room.  Cardiovascular: Normal rate and regular rhythm.   No murmur heard. Pulmonary/Chest: Effort normal and breath sounds normal.  Abdominal: Soft. There is no tenderness.  Neurological: He is alert and oriented to person, place, and time.  Skin: No rash noted.  Right arm PICC.  Psychiatric: Mood and affect normal.    Lab Results Lab Results  Component Value Date   WBC 6.7 08/25/2016   HGB 10.0 (L) 08/25/2016  HCT 30.5 (L) 08/25/2016   MCV 85.9 08/25/2016   PLT 241 08/25/2016    Lab Results  Component Value Date   CREATININE 0.77 08/25/2016   BUN 16 08/25/2016   NA 136 08/25/2016   K 4.3 08/25/2016   CL 102 08/25/2016   CO2 27 08/25/2016    Lab Results  Component Value Date   ALT 69 (H) 08/20/2016   AST 68 (H) 08/20/2016   ALKPHOS 109 08/20/2016   BILITOT 1.3 (H) 08/20/2016     Microbiology: Recent Results (from the past 240 hour(s))  Culture, blood (Routine x 2)     Status: Abnormal   Collection Time: 08/17/16  9:05 PM  Result Value Ref Range Status   Specimen Description BLOOD RIGHT ANTECUBITAL  Final   Special Requests BOTTLES DRAWN AEROBIC AND ANAEROBIC 5 CC  Final   Culture  Setup Time   Final    GRAM POSITIVE COCCI IN CLUSTERS IN BOTH AEROBIC AND ANAEROBIC BOTTLES CRITICAL RESULT CALLED TO, READ BACK BY AND VERIFIED WITH: Christean Grief Pharm.D. 13:00 08/18/16  ([email or web address])     Culture (A)  Final    STAPHYLOCOCCUS AUREUS SUSCEPTIBILITIES PERFORMED ON PREVIOUS CULTURE WITHIN THE LAST 5 DAYS. Performed at [facility], [place] [address], [place], [place] [address]    Report Status 08/20/2016 FINAL  Final  Culture, blood (Routine x 2)     Status: Abnormal   Collection Time: 08/17/16  9:05 PM  Result Value Ref Range Status   Specimen Description BLOOD LEFT HAND  Final   Special Requests BOTTLES DRAWN AEROBIC AND ANAEROBIC 5 CC  Final   Culture  Setup Time   Final    GRAM POSITIVE COCCI IN CLUSTERS IN BOTH AEROBIC AND ANAEROBIC BOTTLES CRITICAL VALUE NOTED.  VALUE IS CONSISTENT WITH PREVIOUSLY REPORTED AND CALLED VALUE. Performed at [facility], [place] [address], [place], [place] [address]    Culture STAPHYLOCOCCUS AUREUS (A)  Final   Report Status 08/20/2016 FINAL  Final   Organism ID, Bacteria STAPHYLOCOCCUS AUREUS  Final      Susceptibility   Staphylococcus aureus - MIC*    CIPROFLOXACIN <=0.5 SENSITIVE Sensitive     ERYTHROMYCIN 0.5 SENSITIVE Sensitive     GENTAMICIN <=0.5 SENSITIVE Sensitive     OXACILLIN 0.5 SENSITIVE Sensitive     TETRACYCLINE <=1 SENSITIVE Sensitive     VANCOMYCIN 1 SENSITIVE Sensitive     TRIMETH/SULFA <=10 SENSITIVE Sensitive     CLINDAMYCIN <=0.25 SENSITIVE Sensitive     RIFAMPIN <=0.5 SENSITIVE Sensitive     Inducible Clindamycin NEGATIVE Sensitive     * STAPHYLOCOCCUS AUREUS  Blood Culture ID Panel (Reflexed)     Status: Abnormal   Collection Time: 08/17/16  9:05 PM  Result Value Ref Range Status   Enterococcus species NOT DETECTED NOT DETECTED Final   Listeria monocytogenes NOT DETECTED NOT DETECTED Final   Staphylococcus species DETECTED (A) NOT DETECTED Final    Comment: CRITICAL RESULT CALLED TO, READ BACK BY AND VERIFIED WITH: Mila Merry.D. 13:00 08/18/16 ([email or web address])    Staphylococcus aureus DETECTED (A) NOT DETECTED Final    Comment: Methicillin (oxacillin) susceptible Staphylococcus aureus (MSSA). Preferred  therapy is anti staphylococcal beta lactam antibiotic (Cefazolin or Nafcillin), unless clinically contraindicated. CRITICAL RESULT CALLED TO, READ BACK BY AND VERIFIED WITH: Christean Grief Pharm.D. 13:00 08/18/16 ([email or web address])    Methicillin resistance NOT DETECTED NOT DETECTED Final   Streptococcus species NOT DETECTED NOT DETECTED Final   Streptococcus agalactiae NOT DETECTED  NOT DETECTED Final   Streptococcus pneumoniae NOT DETECTED NOT DETECTED Final   Streptococcus pyogenes NOT DETECTED NOT DETECTED Final   Acinetobacter baumannii NOT DETECTED NOT DETECTED Final   Enterobacteriaceae species NOT DETECTED NOT DETECTED Final   Enterobacter cloacae complex NOT DETECTED NOT DETECTED Final   Escherichia coli NOT DETECTED NOT DETECTED Final   Klebsiella oxytoca NOT DETECTED NOT DETECTED Final   Klebsiella pneumoniae NOT DETECTED NOT DETECTED Final   Proteus species NOT DETECTED NOT DETECTED Final   Serratia marcescens NOT DETECTED NOT DETECTED Final   Haemophilus influenzae NOT DETECTED NOT DETECTED Final   Neisseria meningitidis NOT DETECTED NOT DETECTED Final   Pseudomonas aeruginosa NOT DETECTED NOT DETECTED Final   Candida albicans NOT DETECTED NOT DETECTED Final   Candida glabrata NOT DETECTED NOT DETECTED Final   Candida krusei NOT DETECTED NOT DETECTED Final   Candida parapsilosis NOT DETECTED NOT DETECTED Final   Candida tropicalis NOT DETECTED NOT DETECTED Final    Comment: Performed at [facility], [place] [address], [place], [place] [address]  Urine culture     Status: Abnormal   Collection Time: 08/18/16 12:52 AM  Result Value Ref Range Status   Specimen Description URINE, CLEAN CATCH  Final   Special Requests NONE  Final   Culture (A)  Final    50,000 COLONIES/mL STAPHYLOCOCCUS SPECIES (COAGULASE NEGATIVE)   Report Status 08/21/2016 FINAL  Final   Organism ID, Bacteria STAPHYLOCOCCUS SPECIES (COAGULASE NEGATIVE) (A)  Final      Susceptibility   Staphylococcus species  (coagulase negative) - MIC*    CIPROFLOXACIN <=0.5 SENSITIVE Sensitive     GENTAMICIN <=0.5 SENSITIVE Sensitive     NITROFURANTOIN <=16 SENSITIVE Sensitive     OXACILLIN >=4 RESISTANT Resistant     TETRACYCLINE 2 SENSITIVE Sensitive     VANCOMYCIN 2 SENSITIVE Sensitive     TRIMETH/SULFA <=10 SENSITIVE Sensitive     CLINDAMYCIN >=8 RESISTANT Resistant     RIFAMPIN <=0.5 SENSITIVE Sensitive     Inducible Clindamycin NEGATIVE Sensitive     * 50,000 COLONIES/mL STAPHYLOCOCCUS SPECIES (COAGULASE NEGATIVE)  MRSA PCR Screening     Status: None   Collection Time: 08/18/16  2:00 AM  Result Value Ref Range Status   MRSA by PCR NEGATIVE NEGATIVE Final    Comment:        The GeneXpert MRSA Assay (FDA approved for NASAL specimens only), is one component of a comprehensive MRSA colonization surveillance program. It is not intended to diagnose MRSA infection nor to guide or monitor treatment for MRSA infections.   Culture, blood (Routine X 2) w Reflex to ID Panel     Status: None   Collection Time: 08/19/16  7:56 AM  Result Value Ref Range Status   Specimen Description BLOOD RIGHT ANTECUBITAL  Final   Special Requests BOTTLES DRAWN AEROBIC AND ANAEROBIC 10CC  Final   Culture   Final    NO GROWTH 5 DAYS Performed at [facility], [place] [address] [place], [place] [address]    Report Status 08/24/2016 FINAL  Final  Culture, blood (Routine X 2) w Reflex to ID Panel     Status: Abnormal   Collection Time: 08/19/16  8:01 AM  Result Value Ref Range Status   Specimen Description BLOOD RIGHT HAND  Final   Special Requests IN PEDIATRIC BOTTLE 3CC  Final   Culture  Setup Time   Final    GRAM POSITIVE COCCI IN CLUSTERS AEROBIC BOTTLE ONLY CRITICAL  VALUE NOTED.  VALUE IS CONSISTENT WITH PREVIOUSLY REPORTED AND CALLED VALUE.    Culture (A)  Final    STAPHYLOCOCCUS AUREUS SUSCEPTIBILITIES PERFORMED ON PREVIOUS CULTURE WITHIN THE LAST 5 DAYS. Performed at [facility], [place] [address], [place], [place] [address]    Report Status 08/21/2016 FINAL  Final  Culture, blood (Routine X 2) w Reflex to ID Panel     Status: None   Collection Time: 08/20/16 10:30 AM  Result Value Ref Range Status   Specimen Description BLOOD RIGHT ARM  8 ML IN [facility] BOTTLE  Final   Special Requests Immunocompromised  Final   Culture   Final    NO GROWTH 5 DAYS Performed at [facility], [place] [address] [place], [place] [address]    Report Status 08/25/2016 FINAL  Final  Culture, blood (Routine X 2) w Reflex to ID Panel     Status: None   Collection Time: 08/20/16 10:30 AM  Result Value Ref Range Status   Specimen Description BLOOD RIGHT HAND  3 ML IN YELLOW  Final   Special Requests Immunocompromised  Final   Culture   Final    NO GROWTH 5 DAYS Performed at [facility], [place] [address] [place] [address], [place], [place] [address]    Report Status 08/25/2016 FINAL  Final  Culture, blood (Routine X 2) w Reflex to ID Panel     Status: None (Preliminary result)   Collection Time: 08/25/16  8:56 PM  Result Value Ref Range Status   Specimen Description LEFT ANTECUBITAL  Final   Special Requests BOTTLES DRAWN AEROBIC AND ANAEROBIC 5CC  Final   Culture   Final    NO GROWTH < 12 HOURS Performed at [facility], [place] [address] [place], [place] [address]    Report Status PENDING  Incomplete  Culture, blood (Routine X 2) w Reflex to ID Panel     Status: None (Preliminary result)   Collection Time: 08/25/16  8:59 PM  Result Value Ref Range Status   Specimen Description BLOOD LEFT ARM  Final   Special Requests IN PEDIATRIC BOTTLE 4CC  Final   Culture   Final    NO GROWTH < 12 HOURS Performed at [facility], [place] [address], [place], [place] [address]    Report Status PENDING  Incomplete     ASSESSMENT: He has MSSA bacteremia complicated by early lumbar infection and a prosthetic aortic valve endocarditis. I plan on 6 full weeks of IV cefazolin and 2 weeks of gentamicin. He will need  close monitoring of his renal function given that he is on gentamicin and Toradol.  PLAN: 1. Continue cefazolin 2. Start IV gentamicin  3. I will sign off now  Diagnosis: MSSA bacteremia with prosthetic aortic valve endocarditis and lumbar infection  Culture Result: MSSA  No Known Allergies  Discharge antibiotics: Per pharmacy protocol IV cefazolin and IV gentamicin Aim for Vancomycin trough 15-20 (unless otherwise indicated) Duration: Cefazolin for 6 weeks Gentamicin for 2 weeks End Date: Cefazolin end date 09/28/2016 Gentamicin end date 09/07/2016  [facility] Care Per Protocol:  Labs weekly while on IV antibiotics: _x_ CBC with differential _x_ BMP Twice weekly while on gentamicin and then once weekly thereafter __ CMP __ CRP __ ESR __ Vancomycin trough  __ Please pull PIC at completion of IV antibiotics _x_ Please leave PIC in place until doctor has seen patient or been notified  Fax weekly labs to [phone]  Clinic Follow Up Appt: 09/27/2016 @ [facility] [place] [facility] [place] [phone] pager   [phone] cell 08/26/2016, 11:50 AM

## 2016-08-26 NOTE — Clinical Social Work Placement (Signed)
Patient is set to discharge to [facility] today. Patient, wife & daughter aware. Discharge packet given to RN, Joellen Jersey. Patient's daughter to transport to SNF this evening.     Raynaldo Opitz, [facility] Clinical Social Worker cell #: [phone]    CLINICAL SOCIAL WORK PLACEMENT  NOTE  Date:  08/26/2016  Patient Details  Name: Jeffery Whitehead MRN: [id number] Date of Birth: 1955-12-03  Clinical Social Work is seeking post-discharge placement for this patient at the [place] level of care (*CSW will initial, date and re-position this form in  chart as items are completed):  Yes   Patient/family provided with [place] Work Department's list of facilities offering this level of care within the geographic area requested by the patient (or if unable, by the patient's family).  Yes   Patient/family informed of their freedom to choose among providers that offer the needed level of care, that participate in Medicare, Medicaid or managed care program needed by the patient, have an available bed and are willing to accept the patient.  Yes   Patient/family informed of [place]'s ownership interest in [facility] and [facility], as well as of the fact that they are under no obligation to receive care at these facilities.  PASRR submitted to EDS on 08/25/16     PASRR number received on 08/25/16     Existing PASRR number confirmed on       FL2 transmitted to all facilities in geographic area requested by pt/family on 08/25/16     FL2 transmitted to all facilities within larger geographic area on       Patient informed that his/her managed care company has contracts with or will negotiate with certain facilities, including the following:        Yes   Patient/family informed of bed offers received.  Patient chooses bed at [facility]     Physician recommends and patient chooses bed at      Patient to  be transferred to [facility] on 08/26/16.  Patient to be transferred to facility by patient's daughter to transport     Patient family notified on 08/26/16 of transfer.  Name of family member notified:  patient states that his daughter & wife are already aware     PHYSICIAN       Additional Comment:    _______________________________________________ Standley Brooking, LCSW 08/26/2016, 4:10 PM

## 2016-08-27 ENCOUNTER — Encounter (HOSPITAL_COMMUNITY)
Admission: RE | Admit: 2016-08-27 | Discharge: 2016-08-27 | Disposition: A | Discharge status: Home / Self Care | Payer: 59 | Source: Skilled Nursing Facility | Attending: Internal Medicine | Admitting: Internal Medicine

## 2016-08-27 DIAGNOSIS — Z7901 Long term (current) use of anticoagulants: Secondary | ICD-10-CM | POA: Insufficient documentation

## 2016-08-27 LAB — PROTIME-INR
INR: 2.73
Prothrombin Time: 29.5 seconds — ABNORMAL HIGH (ref 11.4–15.2)

## 2016-08-29 ENCOUNTER — Encounter: Payer: Self-pay | Admitting: Internal Medicine

## 2016-08-29 ENCOUNTER — Encounter (HOSPITAL_COMMUNITY)
Admission: RE | Admit: 2016-08-29 | Discharge: 2016-08-29 | Disposition: A | Discharge status: Home / Self Care | Payer: 59 | Source: Skilled Nursing Facility | Attending: Internal Medicine | Admitting: Internal Medicine

## 2016-08-29 ENCOUNTER — Non-Acute Institutional Stay (SKILLED_NURSING_FACILITY): Payer: 59 | Admitting: Internal Medicine

## 2016-08-29 DIAGNOSIS — A4101 Sepsis due to Methicillin susceptible Staphylococcus aureus: Secondary | ICD-10-CM | POA: Diagnosis not present

## 2016-08-29 DIAGNOSIS — J4540 Moderate persistent asthma, uncomplicated: Secondary | ICD-10-CM | POA: Diagnosis not present

## 2016-08-29 DIAGNOSIS — Z7901 Long term (current) use of anticoagulants: Secondary | ICD-10-CM | POA: Insufficient documentation

## 2016-08-29 DIAGNOSIS — D649 Anemia, unspecified: Secondary | ICD-10-CM

## 2016-08-29 DIAGNOSIS — T826XXD Infection and inflammatory reaction due to cardiac valve prosthesis, subsequent encounter: Secondary | ICD-10-CM | POA: Diagnosis not present

## 2016-08-29 DIAGNOSIS — I38 Endocarditis, valve unspecified: Secondary | ICD-10-CM

## 2016-08-29 DIAGNOSIS — E876 Hypokalemia: Secondary | ICD-10-CM | POA: Diagnosis not present

## 2016-08-29 LAB — COMPREHENSIVE METABOLIC PANEL
ALK PHOS: 159 U/L — AB (ref 38–126)
ALT: 48 U/L (ref 17–63)
AST: 64 U/L — ABNORMAL HIGH (ref 15–41)
Albumin: 3.2 g/dL — ABNORMAL LOW (ref 3.5–5.0)
Anion gap: 6 (ref 5–15)
BILIRUBIN TOTAL: 0.5 mg/dL (ref 0.3–1.2)
BUN: 15 mg/dL (ref 6–20)
CALCIUM: 8.9 mg/dL (ref 8.9–10.3)
CO2: 29 mmol/L (ref 22–32)
Chloride: 97 mmol/L — ABNORMAL LOW (ref 101–111)
Creatinine, Ser: 0.85 mg/dL (ref 0.61–1.24)
Glucose, Bld: 155 mg/dL — ABNORMAL HIGH (ref 65–99)
Potassium: 4.1 mmol/L (ref 3.5–5.1)
Sodium: 132 mmol/L — ABNORMAL LOW (ref 135–145)
Total Protein: 7.4 g/dL (ref 6.5–8.1)

## 2016-08-29 LAB — CBC WITH DIFFERENTIAL/PLATELET
BASOS PCT: 0 %
Basophils Absolute: 0 10*3/uL (ref 0.0–0.1)
Eosinophils Absolute: 0 10*3/uL (ref 0.0–0.7)
Eosinophils Relative: 0 %
HEMATOCRIT: 31.8 % — AB (ref 39.0–52.0)
Hemoglobin: 10.2 g/dL — ABNORMAL LOW (ref 13.0–17.0)
LYMPHS PCT: 15 %
Lymphs Abs: 1.4 10*3/uL (ref 0.7–4.0)
MCH: 28.3 pg (ref 26.0–34.0)
MCHC: 32.1 g/dL (ref 30.0–36.0)
MCV: 88.1 fL (ref 78.0–100.0)
MONO ABS: 0.4 10*3/uL (ref 0.1–1.0)
MONOS PCT: 5 %
NEUTROS ABS: 7.4 10*3/uL (ref 1.7–7.7)
Neutrophils Relative %: 80 %
Platelets: 365 10*3/uL (ref 150–400)
RBC: 3.61 MIL/uL — ABNORMAL LOW (ref 4.22–5.81)
RDW: 14 % (ref 11.5–15.5)
WBC: 9.2 10*3/uL (ref 4.0–10.5)

## 2016-08-29 LAB — PROTIME-INR
INR: 1.84
Prothrombin Time: 21.5 seconds — ABNORMAL HIGH (ref 11.4–15.2)

## 2016-08-29 NOTE — Progress Notes (Signed)
Location:   [facility]  Room 126 Place of Service:  SNF [phone]) Provider:  [place] Lewis  Couillard, Stephani Police, PA-C  Patient Care Team: Dewayne Shorter, PA-C as PCP - General (Physician Assistant)  Extended Emergency Contact Information Primary Emergency Contact: Clarisa [place] Phone: [phone] Relation: Spouse  Code Status:  Full code Goals of care: Advanced Directive information Advanced Directives 08/29/2016  Does Patient Have a Medical Advance Directive? Yes  Type of Advance Directive (No Data)  Does patient want to make changes to medical advance directive? No - Patient declined  Copy of [place] in Chart? -  Would patient like information on creating a medical advance directive? No - Patient declined     Chief Complaint  Patient presents with  . Acute Visit  Status post hospitalization for bacteremia  HPI:  Pt is a 61 y.o. male seen today for an acute visit for follow-up of hospitalization for sepsis with bacteremia with endocarditis secondary to MSSA infection.  Patient was found by his wife to have increased body aches confusion she called EMS and he was found to be febrile with a temperature of 104.  In the ER he was given IV fluids secondary to low blood pressure.  He was found to have bacteremia with M assess A-with endocarditis with a small vegetation seen on TEE.  He has been started on 6 weeks of IV Ancef and 2 weeks of IV gentamicin.  He will be followed by infectious disease he does have a PICC line.  His encephalopathy resolved.  He also had a potassium of 2.9 which was supplemented and now within normal limits on lab today was 4.1.   He also had elevated liver function tests which apparently trended down before discharge.  He does have a history of a [employer] heart valve-he is on Coumadin INR goal is 2.5-3.5 this week and INR was therapeutic however today it is somewhat low at 1.84 it  was 2.73---2 days ago current dose is 7-1/2 mg a day except 8 mg on Wednesday we will need to give him a higher dose this evening.  He also had a new diagnosis of anemia hemoglobin has stabilized at 10.2 recommendation to check a CBC weekly.  #Hypertension he was hypotensive on admission since then his lisinopril as been restarted pressures here 143/73-135/81.  He also had thrombocytopenia which appears to have resolved although there is a recommendation to check a CBC weekly.  Currently he is sitting in a chair comfortably he has no acute complaints is eating his lunch-does not complain of fever or chills-he appears to be stable he has been afebrile he does complain at times of back pain he is receiving oxycodone as well as Robaxin apparently with some relief    .    Past Medical History:  Diagnosis Date  . Cold   . Decreased anal sphincter tone   . Heart valve replaced    1983   [facility] jud[facility]  . Insomnia   . Shortness of breath dyspnea   . Sleep apnea    cpap  . Varicose vein of leg   . Varicose veins    Past Surgical History:  Procedure Laterality Date  . AORTIC VALVE REPLACEMENT  1983  . FLUOROSCOPY GUIDANCE N/A 08/24/2016   Procedure: Fluoroscopy Guidance;  Surgeon: Leonie Man, MD;  Location: [facility];  Service: Cardiovascular;  Laterality: N/A;  . TEE WITHOUT CARDIOVERSION N/A 08/24/2016  Procedure: TRANSESOPHAGEAL ECHOCARDIOGRAM (TEE);  Surgeon: Pixie Casino, MD;  Location: [facility] ENDOSCOPY;  Service: Cardiovascular;  Laterality: N/A;  . TONSILLECTOMY    . VEIN LIGATION AND STRIPPING Left 08/15/2014   Procedure: SAPHENOUS VEIN LIGATION AND EXCISION OF VARICOSITIES;  Surgeon: Mal Misty, MD;  Location: [facility];  Service: Vascular;  Laterality: Left;    No Known Allergies  Allergies as of 08/29/2016   No Known Allergies     Medication List       Accurate as of 08/29/16 11:29 AM. Always use your most recent med list.          acetaminophen 325 MG  tablet Commonly known as:  TYLENOL Take 2 tablets (650 mg total) by mouth every 6 (six) hours as needed for mild pain, fever or headache (or Fever >/= 101).   albuterol 108 (90 Base) MCG/ACT inhaler Commonly known as:  PROVENTIL HFA;VENTOLIN HFA Inhale 2 puffs into the lungs every 6 (six) hours as needed for wheezing or shortness of breath.   budesonide-formoterol 160-4.5 MCG/ACT inhaler Commonly known as:  SYMBICORT Inhale 2 puffs into the lungs 2 times daily   ceFAZolin 2-4 GM/100ML-% IVPB Commonly known as:  ANCEF Inject 100 mLs (2 g total) into the vein every 8 (eight) hours.   CENTRUM SILVER ADULT 50+ PO Take 1 tablet by mouth daily with breakfast.   CLARITIN 10 MG tablet Generic drug:  loratadine Take 10 mg by mouth daily as needed for allergies.   fluticasone 50 MCG/ACT nasal spray Commonly known as:  FLONASE Place 1 spray into both nostrils as needed for allergies or rhinitis (as needed for allergy symptoms).   gentamicin 280 mg in dextrose 5 % 50 mL Inject 280 mg into the vein daily.   lisinopril 20 MG tablet Commonly known as:  PRINIVIL,ZESTRIL Take 20 mg by mouth daily.   methocarbamol 500 MG tablet Commonly known as:  ROBAXIN Take 1 tablet (500 mg total) by mouth every 8 (eight) hours as needed for muscle spasms.   omeprazole 20 MG capsule Commonly known as:  PRILOSEC Take 1 capsule by mouth daily.   oxyCODONE 5 MG immediate release tablet Commonly known as:  ROXICODONE Take 1 tablet (5 mg total) by mouth every 6 (six) hours as needed for severe pain.   warfarin 7.5 MG tablet Commonly known as:  COUMADIN Take 7.5 mg by mouth as directed. Take 7.5 MG on Sunday, Monday, Tuesday, Thursday, Friday, Saturday. Take 8 MG on Wednesday   zolpidem 10 MG tablet Commonly known as:  AMBIEN Take 10 mg by mouth at bedtime.       Review of Systems   In general does not complaining of any fever or chills.  Skin does not complain of rashes or itching.  Head  ears nose mouth and throat does not complaining of any sore throat or visual changes  Resp- does not complain of increased shortness of breath does not complain of cough.  Cardiac does not complaining of chest pain does have some mild lower extremity edema TED hose is in place.  GI does not complain of nausea vomiting diarrhea or constipation says he is having regular bowel movements does not complain of abdominal pain.  GU is not complaining of dysuria.  Muscle skeletal does complain at times of back pain as noted above.  Neurologic is not complaining of dizziness headache or syncope.   psych does not complain of overt anxiety or depression appears-some history of insomnia he is on  Ambien.      Immunization History  Administered Date(s) Administered  . Influenza, Seasonal, Injecte, Preservative Fre 04/14/2016  . Influenza-Unspecified 04/03/2014   Pertinent  Health Maintenance Due  Topic Date Due  . COLONOSCOPY  05/18/2005  . INFLUENZA VACCINE  06/03/2017 (Originally 02/02/2016)   No flowsheet data found. Functional Status Survey:   Temperature 97.9 pulse 78 respirations 20 blood pressure 143/73-135/81 most recentl   Physical Exam   In general this is a pleasant 61 year old male in no distress completing his chair.  His skin is warm and dry. PICC line in the right arm  Eyes visual acuity appears intact sclera and conjunctiva are clear.  Oropharynx is clear mucous membranes moist.  Chest is clear to auscultation there is no labored breathing.  Heart is regular rate and rhythm--metallic click is appreciated he has mild lower extremity edema TED hose are in place pedal pulses appear to be intact.  Abdomen is soft somewhat obese nontender with positive bowel sounds.  Muscle skeletal does move all extremities 4 strength appears to be intact all 4 extremities he is ambulatory with walker.  He has mild lower extremity edema as noted above.  Neurologic is grossly intact  his speech is clear no lateralizing findings.  Psych he is alert and oriented very pleasant and appropriate Labs reviewed:  Recent Labs  08/24/16 0541 08/25/16 0405 08/29/16 0900  NA 136 136 132*  K 4.4 4.3 4.1  CL 104 102 97*  CO2 26 27 29   GLUCOSE 114* 102* 155*  BUN 13 16 15   CREATININE 0.74 0.77 0.85  CALCIUM 8.7* 8.5* 8.9    Recent Labs  08/18/16 0351 08/20/16 0535 08/29/16 0900  AST 100* 68* 64*  ALT 77* 69* 48  ALKPHOS 63 109 159*  BILITOT 1.8* 1.3* 0.5  PROT 6.4* 6.2* 7.4  ALBUMIN 3.5 3.1* 3.2*    Recent Labs  08/17/16 2104  08/24/16 0541 08/25/16 0405 08/29/16 0900  WBC 12.4*  < > 7.0 6.7 9.2  NEUTROABS 11.9*  --   --   --  7.4  HGB 11.5*  < > 10.0* 10.0* 10.2*  HCT 34.8*  < > 30.2* 30.5* 31.8*  MCV 84.1  < > 83.4 85.9 88.1  PLT 143*  < > 218 241 365  < > = values in this interval not displayed. No results found for: TSH No results found for: HGBA1C Lab Results  Component Value Date   CHOL 174 03/08/2016   HDL 47 03/08/2016   LDLCALC 96 03/08/2016   TRIG 157 (H) 03/08/2016   CHOLHDL 3.7 03/08/2016    Significant Diagnostic Results in last 30 days:  Dg Chest 2 View  Result Date: 08/25/2016 CLINICAL DATA:  61 y/o male inpatient with fever, SOB x1 week. Hx aortic valve replacement, HTN - on meds, non-smoker EXAM: CHEST - 2 VIEW COMPARISON:  08/23/2016 FINDINGS: Lungs clear.  Interval right PICC placement to distal SVC. Heart size and mediastinal contours are within normal limits. Tortuous thoracic aorta. No effusion. Previous median sternotomy and AVR. IMPRESSION: No acute disease post AVR. Electronically Signed   By: Lucrezia Europe M.D.   On: 08/25/2016 16:00   Dg Chest 2 View  Result Date: 08/23/2016 CLINICAL DATA:  Shortness of breath EXAM: CHEST  2 VIEW COMPARISON:  08/17/2016 FINDINGS: Post sternotomy changes. No acute infiltrate or effusion. Stable borderline cardiomegaly. No overt edema. No pneumothorax. IMPRESSION: No radiographic evidence for  acute cardiopulmonary abnormality Electronically Signed   By: Donavan Foil  M.D.   On: 08/23/2016 21:24   Dg Chest 2 View  Result Date: 08/17/2016 CLINICAL DATA:  Body aches and fever EXAM: CHEST  2 VIEW COMPARISON:  None. FINDINGS: Cardiomediastinal silhouette is upper limits normal. No focal airspace consolidation or pulmonary edema. No pneumothorax or pleural effusion. IMPRESSION: No active cardiopulmonary disease. Electronically Signed   By: Ulyses Jarred M.D.   On: 08/17/2016 22:24   Ct Head Wo Contrast  Result Date: 08/17/2016 CLINICAL DATA:  Altered mental status and dizziness EXAM: CT HEAD WITHOUT CONTRAST TECHNIQUE: Contiguous axial images were obtained from the base of the skull through the vertex without intravenous contrast. COMPARISON:  None. FINDINGS: Brain: No mass lesion, intraparenchymal hemorrhage or extra-axial collection. No evidence of acute cortical infarct. Brain parenchyma and CSF-containing spaces are normal for age. Vascular: No hyperdense vessel or unexpected calcification. Skull: Normal visualized skull base, calvarium and extracranial soft tissues. Sinuses/Orbits: No sinus fluid levels or advanced mucosal thickening. No mastoid effusion. Normal orbits. IMPRESSION: Normal CT of the brain. Electronically Signed   By: Ulyses Jarred M.D.   On: 08/17/2016 22:03   Mr Lumbar Spine W Wo Contrast  Result Date: 08/23/2016 CLINICAL DATA:  61 y/o M; MSSA bacteremia with concern for lumbar spine infection. EXAM: MRI LUMBAR SPINE WITHOUT AND WITH CONTRAST TECHNIQUE: Multiplanar and multiecho pulse sequences of the lumbar spine were obtained without and with intravenous contrast. CONTRAST:  73mL MULTIHANCE GADOBENATE DIMEGLUMINE 529 MG/ML IV SOLN COMPARISON:  None. FINDINGS: Segmentation:  Standard. Alignment: Grade 1 L5-S1 anterolisthesis and chronic L5 pars defects. Vertebrae: Mild edema and enhancement of the L5-S1 disc space and endplates. Small bilateral L4-5 facet effusions without  significant edema or enhancement in the facets. No evidence for acute fracture. Conus medullaris: Extends to the L1 level and appears normal. No epidural fluid collection or enhancement of the cauda equina. Paraspinal and other soft tissues: Negative. Disc levels: L1-2: Small disc bulge and mild facet hypertrophy with mild foraminal narrowing. No significant canal stenosis. L2-3: Small disc bulge and mild facet hypertrophy with mild foraminal narrowing. No significant canal stenosis. L3-4: Small disc bulge and mild facet hypertrophy with mild bilateral foraminal narrowing. No significant canal stenosis. Small right facet effusion. L4-5: Small disc bulge and moderate left-greater-than-right facet hypertrophy with mild left-greater-than-right foraminal narrowing. No significant canal stenosis. Small bilateral facet effusions. L5-S1: Anterolisthesis with uncovered disc bulge and moderate bilateral facet hypertrophy greater on the right. Mild left and moderate right foraminal narrowing. There is probable impingement of the right L5 nerve root in the right neural foramen (series 7, image 3). IMPRESSION: 1. Mild edema and enhancement at the L5-S1 disc space and endplates is likely degenerative related to chronic L5 pars defects and grade 1 anterolisthesis. Early discitis osteomyelitis could have this appearance but is considered less likely. 2. Right C3-4 and bilateral C4-5 facet effusions, likely degenerative. 3. Lumbar spondylosis with multilevel mild foraminal narrowing. 4. At the right L5-S1 neural foramen there is moderate narrowing and probable impingement of the exiting L5 nerve root. Electronically Signed   By: Kristine Garbe M.D.   On: 08/23/2016 19:01   US Abdomen Limited Ruq  Result Date: 08/18/2016 CLINICAL DATA:  Elevated liver function tests. EXAM: US ABDOMEN LIMITED - RIGHT UPPER QUADRANT COMPARISON:  None. FINDINGS: Gallbladder: No gallstones or wall thickening visualized. No sonographic  Murphy sign noted by sonographer. Common bile duct: Diameter: 0.2 cm Liver: No focal lesion or intrahepatic biliary ductal dilatation. Diffusely increased echogenicity is seen. IMPRESSION: Fatty infiltration of  the liver. The examination is otherwise negative. Electronically Signed   By: Inge Rise M.D.   On: 08/18/2016 15:58    Assessment/Plan  #1 MSSA--bacteremia with endocarditis-again he is on a prolonged course of IV gentamicin for 2 weeks and Ancef for 6 weeks-gentamicin is being monitored by pharmacy there is recommendation for BMP 2 times a week-to keep an eye on his renal function. While on gentamicin and then one time a week after that  Currently is afebrile appears to be stable in this regards.  #2 history of artificial heart valve-goal INR 2.5-3.5 INR wasn't therapeutic range 2 days ago however INR is low now at 1.84-will give Coumadin 10 mg today then resume schedule 7.5mg  a day but will check an INR tomorrow to make sure INR is trending up.  #3 hypokalemia this appears to be resolved potassium 4.1 on lab done today again we'll monitor this with the frequent BMPs.  #4 history of elevated liver function tests these had been trending down next time a BMP is drawn will add liver function test keep an eye on this.--Ultrasound did show fatty liver.  #5 history of obstructive sleep apnea he continues on CPAP.  #6 history of anemia as noted above this appears stabilized with a hemoglobin of 10.2 today there is recommendation to check a CBC weekly-he is followed by infectious disease.  #7 history of asthma he is on Symbicort as well as when necessary albuterol.  #8 hypertension-blood pressure meds were held in hospital secondary to hypotension-he is now back on lisinopril recent blood pressures 143/73-135/81 at this point will monitor.  #9 history thrombocytopenia platelets where as low as 43,000 hospital these appear to have rebounded with platelet 365,000 on lab today  recommendation to check a CBC weekly.  #10 history of lower extremity edema-this appears to be fairly stable he has compression hose on at this point will monitor he did get a couple doses of Lasix in the hospital-will check weights Monday Wednesday Friday to keep an eye on this.  [id number] note greater than 45 minutes spent assessing patient-reviewing his chart-reviewing his labs-and coordinating formulating a plan of care for numerous diagnoses-of note greater than 50% of time spent coordinating plan of care        Oralia Manis, [place]

## 2016-08-30 ENCOUNTER — Encounter (HOSPITAL_COMMUNITY)
Admission: RE | Admit: 2016-08-30 | Discharge: 2016-08-30 | Disposition: A | Discharge status: Home / Self Care | Payer: 59 | Source: Skilled Nursing Facility | Attending: Internal Medicine | Admitting: Internal Medicine

## 2016-08-30 DIAGNOSIS — G4733 Obstructive sleep apnea (adult) (pediatric): Secondary | ICD-10-CM | POA: Insufficient documentation

## 2016-08-30 DIAGNOSIS — I10 Essential (primary) hypertension: Secondary | ICD-10-CM | POA: Insufficient documentation

## 2016-08-30 DIAGNOSIS — A4101 Sepsis due to Methicillin susceptible Staphylococcus aureus: Secondary | ICD-10-CM | POA: Insufficient documentation

## 2016-08-30 DIAGNOSIS — D638 Anemia in other chronic diseases classified elsewhere: Secondary | ICD-10-CM | POA: Insufficient documentation

## 2016-08-30 DIAGNOSIS — D696 Thrombocytopenia, unspecified: Secondary | ICD-10-CM | POA: Insufficient documentation

## 2016-08-30 DIAGNOSIS — E876 Hypokalemia: Secondary | ICD-10-CM | POA: Insufficient documentation

## 2016-08-30 LAB — BUN: BUN: 18 mg/dL (ref 6–20)

## 2016-08-30 LAB — CULTURE, BLOOD (ROUTINE X 2)
CULTURE: NO GROWTH
CULTURE: NO GROWTH

## 2016-08-30 LAB — CREATININE, SERUM
CREATININE: 0.88 mg/dL (ref 0.61–1.24)
GFR calc Af Amer: >60 mL/min (ref >60)
GFR calc non Af Amer: >60 mL/min (ref >60)

## 2016-08-30 LAB — GENTAMICIN LEVEL, TROUGH

## 2016-08-30 LAB — PROTIME-INR
INR: 1.64
PROTHROMBIN TIME: 19.7 s — AB (ref 11.4–15.2)

## 2016-08-31 ENCOUNTER — Other Ambulatory Visit: Payer: Self-pay | Admitting: *Deleted

## 2016-08-31 ENCOUNTER — Encounter (HOSPITAL_COMMUNITY)
Admission: RE | Admit: 2016-08-31 | Discharge: 2016-08-31 | Disposition: A | Discharge status: Home / Self Care | Payer: 59 | Source: Skilled Nursing Facility | Attending: Internal Medicine | Admitting: Internal Medicine

## 2016-08-31 ENCOUNTER — Non-Acute Institutional Stay (SKILLED_NURSING_FACILITY): Payer: 59 | Admitting: Internal Medicine

## 2016-08-31 DIAGNOSIS — Z7901 Long term (current) use of anticoagulants: Secondary | ICD-10-CM | POA: Diagnosis not present

## 2016-08-31 DIAGNOSIS — I38 Endocarditis, valve unspecified: Secondary | ICD-10-CM

## 2016-08-31 DIAGNOSIS — A4101 Sepsis due to Methicillin susceptible Staphylococcus aureus: Secondary | ICD-10-CM | POA: Insufficient documentation

## 2016-08-31 DIAGNOSIS — D696 Thrombocytopenia, unspecified: Secondary | ICD-10-CM | POA: Insufficient documentation

## 2016-08-31 DIAGNOSIS — Z5181 Encounter for therapeutic drug level monitoring: Secondary | ICD-10-CM | POA: Diagnosis not present

## 2016-08-31 DIAGNOSIS — I10 Essential (primary) hypertension: Secondary | ICD-10-CM | POA: Insufficient documentation

## 2016-08-31 DIAGNOSIS — T826XXD Infection and inflammatory reaction due to cardiac valve prosthesis, subsequent encounter: Secondary | ICD-10-CM | POA: Diagnosis not present

## 2016-08-31 DIAGNOSIS — E876 Hypokalemia: Secondary | ICD-10-CM | POA: Insufficient documentation

## 2016-08-31 DIAGNOSIS — G4733 Obstructive sleep apnea (adult) (pediatric): Secondary | ICD-10-CM | POA: Insufficient documentation

## 2016-08-31 DIAGNOSIS — R52 Pain, unspecified: Secondary | ICD-10-CM | POA: Diagnosis not present

## 2016-08-31 DIAGNOSIS — D638 Anemia in other chronic diseases classified elsewhere: Secondary | ICD-10-CM | POA: Insufficient documentation

## 2016-08-31 LAB — PROTIME-INR
INR: 1.8
PROTHROMBIN TIME: 21.1 s — AB (ref 11.4–15.2)

## 2016-08-31 LAB — GENTAMICIN LEVEL, PEAK: Gentamicin Pk: 8.4 ug/mL (ref 5.0–10.0)

## 2016-08-31 MED ORDER — OXYCODONE HCL 5 MG PO TABS: 5.0000 mg | ORAL_TABLET | Freq: Four times a day (QID) | ORAL | 0 refills | Status: DC | PRN

## 2016-08-31 NOTE — Telephone Encounter (Signed)
[facility] #[phone] Fax: [phone]   

## 2016-09-01 ENCOUNTER — Other Ambulatory Visit (HOSPITAL_COMMUNITY)
Admission: RE | Admit: 2016-09-01 | Discharge: 2016-09-01 | Disposition: A | Discharge status: Home / Self Care | Payer: 59 | Source: Skilled Nursing Facility | Attending: Internal Medicine | Admitting: Internal Medicine

## 2016-09-01 DIAGNOSIS — Z7901 Long term (current) use of anticoagulants: Secondary | ICD-10-CM | POA: Insufficient documentation

## 2016-09-01 LAB — HEPATIC FUNCTION PANEL
ALK PHOS: 118 U/L (ref 38–126)
ALT: 20 U/L (ref 17–63)
AST: 25 U/L (ref 15–41)
Albumin: 3.1 g/dL — ABNORMAL LOW (ref 3.5–5.0)
BILIRUBIN DIRECT: 0.1 mg/dL (ref 0.1–0.5)
Indirect Bilirubin: 0.5 mg/dL (ref 0.3–0.9)
Total Bilirubin: 0.6 mg/dL (ref 0.3–1.2)
Total Protein: 7.5 g/dL (ref 6.5–8.1)

## 2016-09-01 LAB — PROTIME-INR
INR: 1.91
Prothrombin Time: 22.2 seconds — ABNORMAL HIGH (ref 11.4–15.2)

## 2016-09-01 LAB — BASIC METABOLIC PANEL
ANION GAP: 9 (ref 5–15)
BUN: 19 mg/dL (ref 6–20)
CHLORIDE: 96 mmol/L — AB (ref 101–111)
CO2: 28 mmol/L (ref 22–32)
Calcium: 8.8 mg/dL — ABNORMAL LOW (ref 8.9–10.3)
Creatinine, Ser: 0.87 mg/dL (ref 0.61–1.24)
GFR calc Af Amer: >60 mL/min (ref >60)
GLUCOSE: 114 mg/dL — AB (ref 65–99)
POTASSIUM: 4 mmol/L (ref 3.5–5.1)
Sodium: 133 mmol/L — ABNORMAL LOW (ref 135–145)

## 2016-09-01 NOTE — Progress Notes (Signed)
This is an acute visit.  Level care skilled.  Facility is [facility].  Chief complaint-acute visit secondary to pain management.  History of present illness.  Patient is a very pleasant 61 year old male seen today for pain management.  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  He is completing a six-week course of IV Ancef in 2 weeks of IV gentamicin.  He also has a history of a [employer] Jude heart valve is on Coumadin with INR goal 2.5-3.5 this was therapeutic this week and they did drop down to 1.8 for 3 days ago-his Coumadin has been increased up to 10 mg a day and appears after dipping down to 1.64 is now up to 1.8--we will continue his 10 mg and recheck INR tomorrow.  He continues to complain of some pain mostly of his back he says he's had this since hospitalization he is receiving oxycodone 5 mg every 6 hours as needed for pain-he doesn't think this is helping a whole lot-although according to nursing is not asking for pain medication very often-he also has an order for when necessary Robaxin.  Apparently he had been on Toradol in the hospital but apparently there is a significant bleeding risk with this and has been discontinued  Past Medical History:  Diagnosis Date  . Cold   . Decreased anal sphincter tone   . Heart valve replaced    1983   st jude  . Insomnia   . Shortness of breath dyspnea   . Sleep apnea    cpap  . Varicose vein of leg   . Varicose veins         Past Surgical History:  Procedure Laterality Date  . AORTIC VALVE REPLACEMENT  1983  . FLUOROSCOPY GUIDANCE N/A 08/24/2016   Procedure: Fluoroscopy Guidance;  Surgeon: Leonie Man, MD;  Location: [facility];  Service: Cardiovascular;  Laterality: N/A;  . TEE WITHOUT CARDIOVERSION N/A 08/24/2016   Procedure: TRANSESOPHAGEAL ECHOCARDIOGRAM (TEE);  Surgeon: Pixie Casino, MD;  Location: [facility] ENDOSCOPY;  Service:  Cardiovascular;  Laterality: N/A;  . TONSILLECTOMY    . VEIN LIGATION AND STRIPPING Left 08/15/2014   Procedure: SAPHENOUS VEIN LIGATION AND EXCISION OF VARICOSITIES;  Surgeon: Mal Misty, MD;  Location: [place] [place];  Service: Vascular;  Laterality: Left;    No Known Allergies  Allergies as of 08/29/2016   No Known Allergies              Medication List                      acetaminophen 325 MG tablet Commonly known as:  TYLENOL Take 2 tablets (650 mg total) by mouth every 6 (six) hours as needed for mild pain, fever or headache (or Fever >/= 101).   albuterol 108 (90 Base) MCG/ACT inhaler Commonly known as:  PROVENTIL HFA;VENTOLIN HFA Inhale 2 puffs into the lungs every 6 (six) hours as needed for wheezing or shortness of breath.   budesonide-formoterol 160-4.5 MCG/ACT inhaler Commonly known as:  SYMBICORT Inhale 2 puffs into the lungs 2 times daily   ceFAZolin 2-4 GM/100ML-% IVPB Commonly known as:  ANCEF Inject 100 mLs (2 g total) into the vein every 8 (eight) hours.   CENTRUM SILVER ADULT 50+ PO Take 1 tablet by mouth daily with breakfast.   CLARITIN 10 MG tablet Generic drug:  loratadine Take 10 mg by mouth daily as needed for  allergies.   fluticasone 50 MCG/ACT nasal spray Commonly known as:  FLONASE Place 1 spray into both nostrils as needed for allergies or rhinitis (as needed for allergy symptoms).   gentamicin 280 mg in dextrose 5 % 50 mL Inject 280 mg into the vein daily.   lisinopril 20 MG tablet Commonly known as:  PRINIVIL,ZESTRIL Take 20 mg by mouth daily.   methocarbamol 500 MG tablet Commonly known as:  ROBAXIN Take 1 tablet (500 mg total) by mouth every 8 (eight) hours as needed for muscle spasms.   omeprazole 20 MG capsule Commonly known as:  PRILOSEC Take 1 capsule by mouth daily.   oxyCODONE 5 MG immediate release tablet Commonly known as:  ROXICODONE Take 1 tablet (5 mg total) by mouth every 6 (six) hours as  needed for severe pain.    Coumadin 10 mg by mouth daily     zolpidem 10 MG tablet Commonly known as:  AMBIEN Take 10 mg by mouth at bedtime.       Review of Systems   In general does not complaining of any fever or chills.  Skin does not complain of rashes or itching.  Head ears nose mouth and throat does not complaining of any sore throat or visual changes  Resp- does not complain of increased shortness of breath does not complain of cough.  Cardiac does not complaining of chest pain does have some mild lower extremity edema TED hose is in place.  GI does not complain Abdominal discomfort  GU is not complaining of dysuria.  Muscle skeletal does complain at times of back pain as noted above. Says oxycodone is not very effective when necessary  Neurologic is not complaining of dizziness headache or syncope.   psych does not complain of overt anxiety or depression appears-some history of insomnia he is on Ambien.          Immunization History  Administered Date(s) Administered  . Influenza, Seasonal, Injecte, Preservative Fre 04/14/2016  . Influenza-Unspecified 04/03/2014       Pertinent  Health Maintenance Due  Topic Date Due  . COLONOSCOPY  05/18/2005  . INFLUENZA VACCINE  06/03/2017 (Originally 02/02/2016)   No flowsheet data found. Functional Status Survey: Temperature is 98.0 pulse 80 respirations 20 blood pressure 125/70   Physical Exam   In general this is a pleasant 61 year old male in no distress   His skin is warm and dry. PICC line in the right arm     Chest is clear to auscultation there is no labored breathing.  Heart is regular rate and rhythm--metallic click is appreciated he has mild lower extremity edema TED hose are in place  Abdomen is soft somewhat obese nontender with positive bowel sounds.  Muscle skeletal does move all extremities 4 strength appears to be intact all 4 extremities he is  ambulatory with walker. Has some tenderness to palpation of his lower back  He has mild lower extremity edema .  Neurologic is grossly intact his speech is clear no lateralizing findings.  Psych he is alert and oriented very pleasant and appropriate Labs reviewed:  Recent Labs (within last 365 days)   Recent Labs  08/24/16 0541 08/25/16 0405 08/29/16 0900  NA 136 136 132*  K 4.4 4.3 4.1  CL 104 102 97*  CO2 26 27 29   GLUCOSE 114* 102* 155*  BUN 13 16 15   CREATININE 0.74 0.77 0.85  CALCIUM 8.7* 8.5* 8.9      Recent Labs (within last 365 days)  Recent Labs  08/18/16 0351 08/20/16 0535 08/29/16 0900  AST 100* 68* 64*  ALT 77* 69* 48  ALKPHOS 63 109 159*  BILITOT 1.8* 1.3* 0.5  PROT 6.4* 6.2* 7.4  ALBUMIN 3.5 3.1* 3.2*      Recent Labs (within last 365 days)   Recent Labs  08/17/16 2104  08/24/16 0541 08/25/16 0405 08/29/16 0900  WBC 12.4*  < > 7.0 6.7 9.2  NEUTROABS 11.9*  --   --   --  7.4  HGB 11.5*  < > 10.0* 10.0* 10.2*  HCT 34.8*  < > 30.2* 30.5* 31.8*  MCV 84.1  < > 83.4 85.9 88.1  PLT 143*  < > 218 241 365  < > = values in this interval not displayed.   Recent Labs  No results found for: TSH   Recent Labs  No results found for: HGBA1C   Recent Labs       Lab Results  Component Value Date   CHOL 174 03/08/2016   HDL 47 03/08/2016   LDLCALC 96 03/08/2016   TRIG 157 (H) 03/08/2016   CHOLHDL 3.7 03/08/2016      Significant Diagnostic Results in last 30 days:   Imaging Results  Dg Chest 2 View  Result Date: 08/25/2016 CLINICAL DATA:  61 y/o male inpatient with fever, SOB x1 week. Hx aortic valve replacement, HTN - on meds, non-smoker EXAM: CHEST - 2 VIEW COMPARISON:  08/23/2016 FINDINGS: Lungs clear.  Interval right PICC placement to distal SVC. Heart size and mediastinal contours are within normal limits. Tortuous thoracic aorta. No effusion. Previous median sternotomy and AVR. IMPRESSION: No acute disease post  AVR. Electronically Signed   By: Lucrezia Europe M.D.   On: 08/25/2016 16:00   Dg Chest 2 View  Result Date: 08/23/2016 CLINICAL DATA:  Shortness of breath EXAM: CHEST  2 VIEW COMPARISON:  08/17/2016 FINDINGS: Post sternotomy changes. No acute infiltrate or effusion. Stable borderline cardiomegaly. No overt edema. No pneumothorax. IMPRESSION: No radiographic evidence for acute cardiopulmonary abnormality Electronically Signed   By: Donavan Foil M.D.   On: 08/23/2016 21:24   Dg Chest 2 View  Result Date: 08/17/2016 CLINICAL DATA:  Body aches and fever EXAM: CHEST  2 VIEW COMPARISON:  None. FINDINGS: Cardiomediastinal silhouette is upper limits normal. No focal airspace consolidation or pulmonary edema. No pneumothorax or pleural effusion. IMPRESSION: No active cardiopulmonary disease. Electronically Signed   By: Ulyses Jarred M.D.   On: 08/17/2016 22:24   Ct Head Wo Contrast  Result Date: 08/17/2016 CLINICAL DATA:  Altered mental status and dizziness EXAM: CT HEAD WITHOUT CONTRAST TECHNIQUE: Contiguous axial images were obtained from the base of the skull through the vertex without intravenous contrast. COMPARISON:  None. FINDINGS: Brain: No mass lesion, intraparenchymal hemorrhage or extra-axial collection. No evidence of acute cortical infarct. Brain parenchyma and CSF-containing spaces are normal for age. Vascular: No hyperdense vessel or unexpected calcification. Skull: Normal visualized skull base, calvarium and extracranial soft tissues. Sinuses/Orbits: No sinus fluid levels or advanced mucosal thickening. No mastoid effusion. Normal orbits. IMPRESSION: Normal CT of the brain. Electronically Signed   By: Ulyses Jarred M.D.   On: 08/17/2016 22:03   Mr Lumbar Spine W Wo Contrast  Result Date: 08/23/2016 CLINICAL DATA:  61 y/o M; MSSA bacteremia with concern for lumbar spine infection. EXAM: MRI LUMBAR SPINE WITHOUT AND WITH CONTRAST TECHNIQUE: Multiplanar and multiecho pulse sequences of the  lumbar spine were obtained without and with intravenous contrast. CONTRAST:  57mL  MULTIHANCE GADOBENATE DIMEGLUMINE 529 MG/ML IV SOLN COMPARISON:  None. FINDINGS: Segmentation:  Standard. Alignment: Grade 1 L5-S1 anterolisthesis and chronic L5 pars defects. Vertebrae: Mild edema and enhancement of the L5-S1 disc space and endplates. Small bilateral L4-5 facet effusions without significant edema or enhancement in the facets. No evidence for acute fracture. Conus medullaris: Extends to the L1 level and appears normal. No epidural fluid collection or enhancement of the cauda equina. Paraspinal and other soft tissues: Negative. Disc levels: L1-2: Small disc bulge and mild facet hypertrophy with mild foraminal narrowing. No significant canal stenosis. L2-3: Small disc bulge and mild facet hypertrophy with mild foraminal narrowing. No significant canal stenosis. L3-4: Small disc bulge and mild facet hypertrophy with mild bilateral foraminal narrowing. No significant canal stenosis. Small right facet effusion. L4-5: Small disc bulge and moderate left-greater-than-right facet hypertrophy with mild left-greater-than-right foraminal narrowing. No significant canal stenosis. Small bilateral facet effusions. L5-S1: Anterolisthesis with uncovered disc bulge and moderate bilateral facet hypertrophy greater on the right. Mild left and moderate right foraminal narrowing. There is probable impingement of the right L5 nerve root in the right neural foramen (series 7, image 3). IMPRESSION: 1. Mild edema and enhancement at the L5-S1 disc space and endplates is likely degenerative related to chronic L5 pars defects and grade 1 anterolisthesis. Early discitis osteomyelitis could have this appearance but is considered less likely. 2. Right C3-4 and bilateral C4-5 facet effusions, likely degenerative. 3. Lumbar spondylosis with multilevel mild foraminal narrowing. 4. At the right L5-S1 neural foramen there is moderate narrowing and  probable impingement of the exiting L5 nerve root. Electronically Signed   By: Kristine Garbe M.D.   On: 08/23/2016 19:01   US Abdomen Limited Ruq  Result Date: 08/18/2016 CLINICAL DATA:  Elevated liver function tests. EXAM: US ABDOMEN LIMITED - RIGHT UPPER QUADRANT COMPARISON:  None. FINDINGS: Gallbladder: No gallstones or wall thickening visualized. No sonographic Murphy sign noted by sonographer. Common bile duct: Diameter: 0.2 cm Liver: No focal lesion or intrahepatic biliary ductal dilatation. Diffusely increased echogenicity is seen. IMPRESSION: Fatty infiltration of the liver. The examination is otherwise negative. Electronically Signed   By: Inge Rise M.D.   On: 08/18/2016 15:58     Assessment and plan.   pain-he says the oxycodone is not very effective when necessary-I did discuss options in regards to hydrocodone apparently this has led to mental status changes.  Will change oxycodone to controlled release 10 mg every 12 hours and hold for sedation or respiratory depression.  For breakthrough pain did discuss options patient and his wife would be more comfortable not having oxycodone for breakthrough pain and will start tramadol every 6 hours 50 mg.  Continue to monitor his pain if not effective certainly will make adjustments and this was discussed with patient and his wife as well.   #2 anticoagulation management with history of artificial heart valve-INR is rising with Coumadin 10 mg a day-we'll continue 10 mg and recheck this again tomorrow will have to keep a close eye on this  since he is on antibiotics  [id number]-of note greater than 25 minutes spent assessing patient-reviewing his labs-reviewing his chart-discussing pain management option with patient and his wife at bedsideand  coordinating plan of care-of note greater than 50% of times spent coordinating plan of care

## 2016-09-02 ENCOUNTER — Encounter: Payer: Self-pay | Admitting: Internal Medicine

## 2016-09-02 ENCOUNTER — Non-Acute Institutional Stay (SKILLED_NURSING_FACILITY): Payer: 59 | Admitting: Internal Medicine

## 2016-09-02 ENCOUNTER — Other Ambulatory Visit (HOSPITAL_COMMUNITY)
Admission: RE | Admit: 2016-09-02 | Discharge: 2016-09-02 | Disposition: A | Discharge status: Home / Self Care | Payer: 59 | Source: Skilled Nursing Facility | Attending: Pediatrics | Admitting: Pediatrics

## 2016-09-02 DIAGNOSIS — Z7901 Long term (current) use of anticoagulants: Secondary | ICD-10-CM

## 2016-09-02 DIAGNOSIS — Z952 Presence of prosthetic heart valve: Secondary | ICD-10-CM | POA: Diagnosis not present

## 2016-09-02 DIAGNOSIS — Z5181 Encounter for therapeutic drug level monitoring: Secondary | ICD-10-CM | POA: Diagnosis not present

## 2016-09-02 DIAGNOSIS — M549 Dorsalgia, unspecified: Secondary | ICD-10-CM

## 2016-09-02 DIAGNOSIS — M4646 Discitis, unspecified, lumbar region: Secondary | ICD-10-CM

## 2016-09-02 LAB — PROTIME-INR
INR: 2.05
PROTHROMBIN TIME: 23.4 s — AB (ref 11.4–15.2)

## 2016-09-02 NOTE — Progress Notes (Signed)
Location:   [facility] Room Number: 123/P Place of Service:  SNF (31) Provider:  Hedwig Morton, PA-C  Patient Care Team: Dewayne Shorter, PA-C as PCP - General (Physician Assistant)  Extended Emergency Contact Information Primary Emergency Contact: Clarisa Kind[place] Phone: [phone] Relation: Spouse  Code Status:  Full Code Goals of care: Advanced Directive information Advanced Directives 09/02/2016  Does Patient Have a Medical Advance Directive? Yes  Type of Advance Directive (No Data)  Does patient want to make changes to medical advance directive? No - Patient declined  Copy of [place] in Chart? -  Would patient like information on creating a medical advance directive? No - Patient declined     Chief Complaint  Patient presents with  . Acute Visit  Follow-up of pain management as well as anticoagulation management  HPI:  Pt is a 61 y.o. male seen today for an acute visit for pain management-as well as anticoagulation management.  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  He is completing a six-week course of IV Ancef in 2 weeks of IV gentamicin.  He also has a history of a St. Jude heart valve is on Coumadin with INR goal 2.5-3.5 This was therapeutic over the weekend but did drop on Monday-self increased his Coumadin up to 10 mg a day starting on Monday.  This is gradually rising now up to 2.05 it was 1.91 yesterday-1.8 the day before.  Regards to pain management he continues to complain of some back pain I did see him several days ago and we did put him on controlled release OxyContin 10 mg every 12 hours and tramadol every 6 hours when necessary for breakthrough pain.  Per nursing he is not getting the tramadol very often.  He also has Robaxin every 8 hours when necessary.  I did encourage him to  ask for the tramadol more often-he continues to be somewhat hesitant to ask for pain medication-.  He says the pain in his right back is somewhat chronic since hospitalization he did have Toradol in the hospital but this was discontinued secondary to concerns oitMay contribute to a bleeding risk.      Past Medical History:  Diagnosis Date  . Cold   . Decreased anal sphincter tone   . Heart valve replaced    1983   st jude  . Insomnia   . Shortness of breath dyspnea   . Sleep apnea    cpap  . Varicose vein of leg   . Varicose veins    Past Surgical History:  Procedure Laterality Date  . AORTIC VALVE REPLACEMENT  1983  . FLUOROSCOPY GUIDANCE N/A 08/24/2016   Procedure: Fluoroscopy Guidance;  Surgeon: Leonie Man, MD;  Location: [facility];  Service: Cardiovascular;  Laterality: N/A;  . TEE WITHOUT CARDIOVERSION N/A 08/24/2016   Procedure: TRANSESOPHAGEAL ECHOCARDIOGRAM (TEE);  Surgeon: Pixie Casino, MD;  Location: [facility];  Service: Cardiovascular;  Laterality: N/A;  . TONSILLECTOMY    . VEIN LIGATION AND STRIPPING Left 08/15/2014   Procedure: SAPHENOUS VEIN LIGATION AND EXCISION OF VARICOSITIES;  Surgeon: Mal Misty, MD;  Location: [facility];  Service: Vascular;  Laterality: Left;    No Known Allergies  Current Outpatient Prescriptions on File Prior to Visit  Medication Sig Dispense Refill  . acetaminophen (TYLENOL) 325 MG tablet Take 2 tablets (650 mg total)  by mouth every 6 (six) hours as needed for mild pain, fever or headache (or Fever >/= 101).    Marland Kitchen albuterol (PROVENTIL HFA;VENTOLIN HFA) 108 (90 Base) MCG/ACT inhaler Inhale 2 puffs into the lungs every 6 (six) hours as needed for wheezing or shortness of breath. 1 Inhaler 5  . budesonide-formoterol (SYMBICORT) 160-4.5 MCG/ACT inhaler Inhale 2 puffs into the lungs 2 times daily 1 Inhaler 6  . ceFAZolin (ANCEF) 2-4 GM/100ML-% IVPB Inject 100 mLs (2 g total) into the vein every 8 (eight) hours. 9900 mL 0  .  fluticasone (FLONASE) 50 MCG/ACT nasal spray Place 1 spray into both nostrils as needed for allergies or rhinitis (as needed for allergy symptoms).    Marland Kitchen gentamicin 280 mg in dextrose 5 % 50 mL Inject 280 mg into the vein daily. 3920 mg 0  . lisinopril (PRINIVIL,ZESTRIL) 20 MG tablet Take 20 mg by mouth daily.    Marland Kitchen loratadine (CLARITIN) 10 MG tablet Take 10 mg by mouth daily as needed for allergies.     . methocarbamol (ROBAXIN) 500 MG tablet Take 1 tablet (500 mg total) by mouth every 8 (eight) hours as needed for muscle spasms.    . Multiple Vitamins-Minerals (CENTRUM SILVER ADULT 50+ PO) Take 1 tablet by mouth daily with breakfast.    . omeprazole (PRILOSEC) 20 MG capsule Take 1 capsule by mouth daily.    Marland Kitchen warfarin (COUMADIN) 7.5 MG tablet Take 10 mg by mouth daily. Take 10 mg by mouth daily    . zolpidem (AMBIEN) 10 MG tablet Take 10 mg by mouth at bedtime.      No current facility-administered medications on file prior to visit.      Review of Systems In general does not complaining of any fever or chills.  Skin does not complain of rashes or itching.  Head ears nose mouth and throat does not complaining of any sore throat or visual changes  Resp-does not complain of increased shortness of breath does not complain of cough.  Cardiac does not complaining of chest pain does have some mild lower extremity edema TED hose is in place.  GI does not complain Abdominal discomfort  GU is not complaining of dysuria.  Muscle skeletal does complain at times of back pain as noted above--more on the right mid lower back  Neurologic is not complaining of dizziness headache or syncope.  psych does not complain of overt anxiety or depression appears-some history of insomnia he is on Ambien.  Immunization History  Administered Date(s) Administered  . Influenza, Seasonal, Injecte, Preservative Fre 04/14/2016  . Influenza-Unspecified 04/03/2014   Pertinent  Health Maintenance Due   Topic Date Due  . COLONOSCOPY  05/18/2005  . INFLUENZA VACCINE  Completed   No flowsheet data found. Functional Status Survey:    Vitals:   09/02/16 1303  BP: (!) 108/56  Pulse: 75  Resp: 20  Temp: 98 F (36.7 C)  TempSrc: Oral  SpO2: 96%  Weight is 248.8 pounds  Physical Exam   In general this is a pleasant 61 year old male in no distress   His skin is warm and dry. PICC line in the right arm     Chest is clear to auscultation there is no labored breathing.  Heart is regular rate and rhythm--metallic click is appreciated he has mild lower extremity edema TED hose are in place  Abdomen is soft somewhat obese nontender with positive bowel sounds.  Muscle skeletal does move all extremities 4 strength appears to be  intact all 4 extremities he is ambulatory with walker.  Continues to have Has some tenderness to palpation of his lower back more so on the right  He has mild lower extremity edema .  Neurologic is grossly intact his speech is clear no lateralizing findings.  Psych he is alert and oriented very pleasant and appropriate Labs reviewed:  Labs reviewed:  Recent Labs  08/25/16 0405 08/29/16 0900 08/30/16 1700 09/01/16 0800  NA 136 132*  --  133*  K 4.3 4.1  --  4.0  CL 102 97*  --  96*  CO2 27 29  --  28  GLUCOSE 102* 155*  --  114*  BUN 16 15 18 19   CREATININE 0.77 0.85 0.88 0.87  CALCIUM 8.5* 8.9  --  8.8*    Recent Labs  08/20/16 0535 08/29/16 0900 09/01/16 0800  AST 68* 64* 25  ALT 69* 48 20  ALKPHOS 109 159* 118  BILITOT 1.3* 0.5 0.6  PROT 6.2* 7.4 7.5  ALBUMIN 3.1* 3.2* 3.1*    Recent Labs  08/17/16 2104  08/24/16 0541 08/25/16 0405 08/29/16 0900  WBC 12.4*  < > 7.0 6.7 9.2  NEUTROABS 11.9*  --   --   --  7.4  HGB 11.5*  < > 10.0* 10.0* 10.2*  HCT 34.8*  < > 30.2* 30.5* 31.8*  MCV 84.1  < > 83.4 85.9 88.1  PLT 143*  < > 218 241 365  < > = values in this interval not displayed. No results found for: TSH No  results found for: HGBA1C Lab Results  Component Value Date   CHOL 174 03/08/2016   HDL 47 03/08/2016   LDLCALC 96 03/08/2016   TRIG 157 (H) 03/08/2016   CHOLHDL 3.7 03/08/2016    Significant Diagnostic Results in last 30 days:  Dg Chest 2 View  Result Date: 08/25/2016 CLINICAL DATA:  61 y/o male inpatient with fever, SOB x1 week. Hx aortic valve replacement, HTN - on meds, non-smoker EXAM: CHEST - 2 VIEW COMPARISON:  08/23/2016 FINDINGS: Lungs clear.  Interval right PICC placement to distal SVC. Heart size and mediastinal contours are within normal limits. Tortuous thoracic aorta. No effusion. Previous median sternotomy and AVR. IMPRESSION: No acute disease post AVR. Electronically Signed   By: Lucrezia Europe M.D.   On: 08/25/2016 16:00   Dg Chest 2 View  Result Date: 08/23/2016 CLINICAL DATA:  Shortness of breath EXAM: CHEST  2 VIEW COMPARISON:  08/17/2016 FINDINGS: Post sternotomy changes. No acute infiltrate or effusion. Stable borderline cardiomegaly. No overt edema. No pneumothorax. IMPRESSION: No radiographic evidence for acute cardiopulmonary abnormality Electronically Signed   By: Donavan Foil M.D.   On: 08/23/2016 21:24   Dg Chest 2 View  Result Date: 08/17/2016 CLINICAL DATA:  Body aches and fever EXAM: CHEST  2 VIEW COMPARISON:  None. FINDINGS: Cardiomediastinal silhouette is upper limits normal. No focal airspace consolidation or pulmonary edema. No pneumothorax or pleural effusion. IMPRESSION: No active cardiopulmonary disease. Electronically Signed   By: Ulyses Jarred M.D.   On: 08/17/2016 22:24   Ct Head Wo Contrast  Result Date: 08/17/2016 CLINICAL DATA:  Altered mental status and dizziness EXAM: CT HEAD WITHOUT CONTRAST TECHNIQUE: Contiguous axial images were obtained from the base of the skull through the vertex without intravenous contrast. COMPARISON:  None. FINDINGS: Brain: No mass lesion, intraparenchymal hemorrhage or extra-axial collection. No evidence of acute cortical  infarct. Brain parenchyma and CSF-containing spaces are normal for age. Vascular: No hyperdense  vessel or unexpected calcification. Skull: Normal visualized skull base, calvarium and extracranial soft tissues. Sinuses/Orbits: No sinus fluid levels or advanced mucosal thickening. No mastoid effusion. Normal orbits. IMPRESSION: Normal CT of the brain. Electronically Signed   By: Ulyses Jarred M.D.   On: 08/17/2016 22:03   Mr Lumbar Spine W Wo Contrast  Result Date: 08/23/2016 CLINICAL DATA:  61 y/o M; MSSA bacteremia with concern for lumbar spine infection. EXAM: MRI LUMBAR SPINE WITHOUT AND WITH CONTRAST TECHNIQUE: Multiplanar and multiecho pulse sequences of the lumbar spine were obtained without and with intravenous contrast. CONTRAST:  77mL MULTIHANCE GADOBENATE DIMEGLUMINE 529 MG/ML IV SOLN COMPARISON:  None. FINDINGS: Segmentation:  Standard. Alignment: Grade 1 L5-S1 anterolisthesis and chronic L5 pars defects. Vertebrae: Mild edema and enhancement of the L5-S1 disc space and endplates. Small bilateral L4-5 facet effusions without significant edema or enhancement in the facets. No evidence for acute fracture. Conus medullaris: Extends to the L1 level and appears normal. No epidural fluid collection or enhancement of the cauda equina. Paraspinal and other soft tissues: Negative. Disc levels: L1-2: Small disc bulge and mild facet hypertrophy with mild foraminal narrowing. No significant canal stenosis. L2-3: Small disc bulge and mild facet hypertrophy with mild foraminal narrowing. No significant canal stenosis. L3-4: Small disc bulge and mild facet hypertrophy with mild bilateral foraminal narrowing. No significant canal stenosis. Small right facet effusion. L4-5: Small disc bulge and moderate left-greater-than-right facet hypertrophy with mild left-greater-than-right foraminal narrowing. No significant canal stenosis. Small bilateral facet effusions. L5-S1: Anterolisthesis with uncovered disc bulge and  moderate bilateral facet hypertrophy greater on the right. Mild left and moderate right foraminal narrowing. There is probable impingement of the right L5 nerve root in the right neural foramen (series 7, image 3). IMPRESSION: 1. Mild edema and enhancement at the L5-S1 disc space and endplates is likely degenerative related to chronic L5 pars defects and grade 1 anterolisthesis. Early discitis osteomyelitis could have this appearance but is considered less likely. 2. Right C3-4 and bilateral C4-5 facet effusions, likely degenerative. 3. Lumbar spondylosis with multilevel mild foraminal narrowing. 4. At the right L5-S1 neural foramen there is moderate narrowing and probable impingement of the exiting L5 nerve root. Electronically Signed   By: Kristine Garbe M.D.   On: 08/23/2016 19:01   US Abdomen Limited Ruq  Result Date: 08/18/2016 CLINICAL DATA:  Elevated liver function tests. EXAM: US ABDOMEN LIMITED - RIGHT UPPER QUADRANT COMPARISON:  None. FINDINGS: Gallbladder: No gallstones or wall thickening visualized. No sonographic Murphy sign noted by sonographer. Common bile duct: Diameter: 0.2 cm Liver: No focal lesion or intrahepatic biliary ductal dilatation. Diffusely increased echogenicity is seen. IMPRESSION: Fatty infiltration of the liver. The examination is otherwise negative. Electronically Signed   By: Inge Rise M.D.   On: 08/18/2016 15:58    Assessment/Plan  #1-pain management-continues to complain of some back pain-I have encouraged him to ask for the when necessary tramadol-at this point will continue the oxycodone 10 mg every 12 hours-he has been somewhat hesitant to take too much pain medication which is understandable-but I told him if he is hurting he probably needs to ask for the tramadol and see if that is effective certainly if not we will have to readdress-also will obtain x-ray of his thoracic and lumbar spine.  #2 anticoagulation management with history of artificial  heart valve-INR is trending closer to therapeutic range will continue the 10 mg a day and have this rechecked again tomorrow.  [id number]

## 2016-09-03 ENCOUNTER — Encounter (HOSPITAL_COMMUNITY)
Admission: RE | Admit: 2016-09-03 | Discharge: 2016-09-03 | Disposition: A | Discharge status: Home / Self Care | Payer: 59 | Source: Skilled Nursing Facility | Attending: Internal Medicine | Admitting: Internal Medicine

## 2016-09-03 DIAGNOSIS — Z7901 Long term (current) use of anticoagulants: Secondary | ICD-10-CM | POA: Insufficient documentation

## 2016-09-03 LAB — PROTIME-INR
INR: 2.23
Prothrombin Time: 25.1 seconds — ABNORMAL HIGH (ref 11.4–15.2)

## 2016-09-04 ENCOUNTER — Non-Acute Institutional Stay (SKILLED_NURSING_FACILITY): Payer: 59 | Admitting: Internal Medicine

## 2016-09-04 ENCOUNTER — Encounter (HOSPITAL_COMMUNITY)
Admission: RE | Admit: 2016-09-04 | Discharge: 2016-09-04 | Disposition: A | Discharge status: Home / Self Care | Payer: 59 | Source: Skilled Nursing Facility | Attending: *Deleted | Admitting: *Deleted

## 2016-09-04 ENCOUNTER — Inpatient Hospital Stay (HOSPITAL_COMMUNITY): Admit: 2016-09-04 | Payer: Self-pay

## 2016-09-04 DIAGNOSIS — M4646 Discitis, unspecified, lumbar region: Secondary | ICD-10-CM | POA: Diagnosis not present

## 2016-09-04 DIAGNOSIS — A4101 Sepsis due to Methicillin susceptible Staphylococcus aureus: Secondary | ICD-10-CM

## 2016-09-04 DIAGNOSIS — I330 Acute and subacute infective endocarditis: Secondary | ICD-10-CM | POA: Diagnosis not present

## 2016-09-04 LAB — PROTIME-INR
INR: 2.53
Prothrombin Time: 27.7 seconds — ABNORMAL HIGH (ref 11.4–15.2)

## 2016-09-04 LAB — CREATININE, SERUM
Creatinine, Ser: 1.22 mg/dL (ref 0.61–1.24)
GFR calc non Af Amer: >60 mL/min (ref >60)

## 2016-09-04 NOTE — Progress Notes (Signed)
09/04/16  Facility [facility]  Chief complaint; review of medical issues status post admission to [facility] 2/14 through 08/26/16  History; this patient tells me he was well up until the day before admission went to sleep and awoke an acutely confused status state with a temperature of 104. In hospital he was found to have sex sepsis secondary to MSSA. He is on 6 weeks of IV Ancef and 2 weeks of IV gentamicin which should just about be completed. He tells me that during the hospitalization itself he was up walking in the hall and all of a sudden developed back pain which is becoming increasingly disabling. He had an MRI of the back that showed mild edema and enhancement of the L5-S1 disc space likely degenerative according to the radiologist however early discitis osteomyelitis could have this appearance. He has lumbar spinal low-dose is with multiple mild foraminal narrowing. At the right L5-S1 there is moderate narrowing and impingement of L5. TEE suggested a small vegetation on the mechanical valve  The patient really only complains about low back pain which is just right of L4-L5 area. He is not having any dysuria or diarrhea. No chest pain. He does have a cough but no shortness of breath or chest pain. His wife states that he "always coughs]. The pain is severe enough that he cannot lie down in bed. He is on oxycodone CR 10 mg by mouth every 12 and Ultram. 50 by mouth every 6 hours when necessary  He has a history of a St. Jude's artificial valve that was put in in 1993 secondary to a bicuspid aortic valve. He is on long-standing Coumadin 7-1/2 for 6 days a week and ate one additional day.  BMP Latest Ref Rng & Units 09/01/2016 08/30/2016 08/29/2016  Glucose 65 - 99 mg/dL 114(H) - 155(H)  BUN 6 - 20 mg/dL 19 18 15   Creatinine 0.61 - 1.24 mg/dL 0.87 0.88 0.85  Sodium 135 - 145 mmol/L 133(L) - 132(L)  Potassium 3.5 - 5.1 mmol/L 4.0 - 4.1  Chloride 101 - 111 mmol/L 96(L) - 97(L)  CO2 22 - 32 mmol/L  28 - 29  Calcium 8.9 - 10.3 mg/dL 8.8(L) - 8.9   CBC Latest Ref Rng & Units 08/29/2016 08/25/2016 08/24/2016  WBC 4.0 - 10.5 K/uL 9.2 6.7 7.0  Hemoglobin 13.0 - 17.0 g/dL 10.2(L) 10.0(L) 10.0(L)  Hematocrit 39.0 - 52.0 % 31.8(L) 30.5(L) 30.2(L)  Platelets 150 - 400 K/uL 365 241 218    Past Medical History:  Diagnosis Date  . Cold   . Decreased anal sphincter tone   . Heart valve replaced    1983   st jude  . Insomnia   . Shortness of breath dyspnea   . Sleep apnea    cpap  . Varicose vein of leg   . Varicose veins     Current Outpatient Prescriptions on File Prior to Visit  Medication Sig Dispense Refill  . acetaminophen (TYLENOL) 325 MG tablet Take 2 tablets (650 mg total) by mouth every 6 (six) hours as needed for mild pain, fever or headache (or Fever >/= 101).    Marland Kitchen albuterol (PROVENTIL HFA;VENTOLIN HFA) 108 (90 Base) MCG/ACT inhaler Inhale 2 puffs into the lungs every 6 (six) hours as needed for wheezing or shortness of breath. 1 Inhaler 5  . budesonide-formoterol (SYMBICORT) 160-4.5 MCG/ACT inhaler Inhale 2 puffs into the lungs 2 times daily 1 Inhaler 6  . ceFAZolin (ANCEF) 2-4 GM/100ML-% IVPB Inject 100 mLs (  2 g total) into the vein every 8 (eight) hours. 9900 mL 0  . fluticasone (FLONASE) 50 MCG/ACT nasal spray Place 1 spray into both nostrils as needed for allergies or rhinitis (as needed for allergy symptoms).    Marland Kitchen gentamicin 280 mg in dextrose 5 % 50 mL Inject 280 mg into the vein daily. 3920 mg 0  . lisinopril (PRINIVIL,ZESTRIL) 20 MG tablet Take 20 mg by mouth daily.    Marland Kitchen loratadine (CLARITIN) 10 MG tablet Take 10 mg by mouth daily as needed for allergies.     . Menthol, Topical Analgesic, (BIOFREEZE) 4 % GEL Apply a small amount to the spine prn    . methocarbamol (ROBAXIN) 500 MG tablet Take 1 tablet (500 mg total) by mouth every 8 (eight) hours as needed for muscle spasms.    . Multiple Vitamins-Minerals (CENTRUM SILVER ADULT 50+ PO) Take 1 tablet by mouth daily with  breakfast.    . omeprazole (PRILOSEC) 20 MG capsule Take 1 capsule by mouth daily.    . Oxycodone HCl 10 MG TABS Take 10 mg by mouth 2 (two) times daily.    Marland Kitchen senna (SENOKOT) 8.6 MG TABS tablet Take 1 tablet by mouth 2 (two) times daily. Hold for diarrhea    . traMADol (ULTRAM) 50 MG tablet Take 50 mg by mouth every 6 (six) hours as needed.    . warfarin (COUMADIN) 7.5 MG tablet Take 10 mg by mouth daily. Take 10 mg by mouth daily    . zolpidem (AMBIEN) 10 MG tablet Take 10 mg by mouth at bedtime.        reports that he has never smoked. He has never used smokeless tobacco. He reports that he drinks alcohol. He reports that he does not use drugs.  Review of systems Gen. he episodically feels hot and cold but has no fever. Respiratory cough without shortness of breath no sputum Cardiac no chest pain GI no diarrhea GU no dysuria Musculoskeletal increasingly disabling L4-L5 back pain nonradiating. He has trouble getting in and of the chair cannot lie in bed at night. Neurologic no focal complaints.  Physical examination Gen. the patient looks well however is uncomfortable when he tries to stand up Respiratory lungs are totally clear Cardiac heart sounds mechanical S2 no murmurs JVP is not elevated Extremities some edema in both legs. Mental status alert conversational  Patient/plan #1 methicillin sensitive staph aureus sepsis/endocarditis and likely discitis. He should be completing the gentamicin early this week therefore I don't think another level as necessary his levels were therapeutic on 2/27. Should be completing the gentamicin early this week Ancef for a total of 6 weeks #2 low back pain likely secondary to L5-S1 discitis this is becoming his major complaint #3 and adequate analgesics given this man's increasing pain I am adding 10 mg of oxycodone by mouth every 6 hours when necessary in lieu of tramadol. He is already on oxycodone CR 10 mg every 12. #4 lower extremity edema check  a BUN and creatinine and monitor this carefully. We'll also check his white count differential count tomorrow.

## 2016-09-05 ENCOUNTER — Encounter (HOSPITAL_COMMUNITY)
Admission: RE | Admit: 2016-09-05 | Discharge: 2016-09-05 | Disposition: A | Discharge status: Home / Self Care | Payer: 59 | Source: Skilled Nursing Facility | Attending: *Deleted | Admitting: *Deleted

## 2016-09-05 LAB — BASIC METABOLIC PANEL
Anion gap: 10 (ref 5–15)
BUN: 30 mg/dL — ABNORMAL HIGH (ref 6–20)
CO2: 25 mmol/L (ref 22–32)
CREATININE: 1.07 mg/dL (ref 0.61–1.24)
Calcium: 9.3 mg/dL (ref 8.9–10.3)
Chloride: 98 mmol/L — ABNORMAL LOW (ref 101–111)
GFR calc Af Amer: >60 mL/min (ref >60)
GFR calc non Af Amer: >60 mL/min (ref >60)
Glucose, Bld: 107 mg/dL — ABNORMAL HIGH (ref 65–99)
Potassium: 4.4 mmol/L (ref 3.5–5.1)
SODIUM: 133 mmol/L — AB (ref 135–145)

## 2016-09-05 LAB — PROTIME-INR
INR: 2.45
Prothrombin Time: 27 seconds — ABNORMAL HIGH (ref 11.4–15.2)

## 2016-09-05 LAB — CBC WITH DIFFERENTIAL/PLATELET
BASOS PCT: 0 %
Basophils Absolute: 0 10*3/uL (ref 0.0–0.1)
Eosinophils Absolute: 0.1 10*3/uL (ref 0.0–0.7)
Eosinophils Relative: 1 %
HCT: 29.7 % — ABNORMAL LOW (ref 39.0–52.0)
Hemoglobin: 9.7 g/dL — ABNORMAL LOW (ref 13.0–17.0)
Lymphocytes Relative: 24 %
Lymphs Abs: 1.6 10*3/uL (ref 0.7–4.0)
MCH: 28.4 pg (ref 26.0–34.0)
MCHC: 32.7 g/dL (ref 30.0–36.0)
MCV: 86.8 fL (ref 78.0–100.0)
MONO ABS: 0.6 10*3/uL (ref 0.1–1.0)
Monocytes Relative: 8 %
NEUTROS ABS: 4.3 10*3/uL (ref 1.7–7.7)
Neutrophils Relative %: 67 %
Platelets: 352 10*3/uL (ref 150–400)
RBC: 3.42 MIL/uL — AB (ref 4.22–5.81)
RDW: 13.6 % (ref 11.5–15.5)
WBC: 6.6 10*3/uL (ref 4.0–10.5)

## 2016-09-05 LAB — GENTAMICIN LEVEL, PEAK: GENTAMICIN PK: 5.7 ug/mL (ref 5.0–10.0)

## 2016-09-05 LAB — GENTAMICIN LEVEL, TROUGH: Gentamicin Trough: <0.5 ug/mL — ABNORMAL LOW (ref 0.5–2.0)

## 2016-09-06 ENCOUNTER — Non-Acute Institutional Stay (SKILLED_NURSING_FACILITY): Payer: 59 | Admitting: Internal Medicine

## 2016-09-06 ENCOUNTER — Other Ambulatory Visit (HOSPITAL_COMMUNITY)
Admission: RE | Admit: 2016-09-06 | Discharge: 2016-09-06 | Disposition: A | Discharge status: Home / Self Care | Payer: 59 | Source: Skilled Nursing Facility | Attending: Internal Medicine | Admitting: Internal Medicine

## 2016-09-06 DIAGNOSIS — A4101 Sepsis due to Methicillin susceptible Staphylococcus aureus: Secondary | ICD-10-CM

## 2016-09-06 DIAGNOSIS — I38 Endocarditis, valve unspecified: Secondary | ICD-10-CM

## 2016-09-06 DIAGNOSIS — Z7901 Long term (current) use of anticoagulants: Secondary | ICD-10-CM | POA: Diagnosis present

## 2016-09-06 DIAGNOSIS — R600 Localized edema: Secondary | ICD-10-CM

## 2016-09-06 DIAGNOSIS — T826XXD Infection and inflammatory reaction due to cardiac valve prosthesis, subsequent encounter: Secondary | ICD-10-CM

## 2016-09-06 LAB — PROTIME-INR
INR: 2.71
Prothrombin Time: 29.3 seconds — ABNORMAL HIGH (ref 11.4–15.2)

## 2016-09-06 NOTE — Progress Notes (Signed)
This is an acute visit.  Level care skilled.  Facility is [facility].  Chief complaint-acute visit follow-up leg edema.  History of present illness.  Patient is a pleasant 61 year old male  with a history of sepsis secondary to MSSA.  He is completing a two-week course of IV gentamicin is also on a six-week course of IV Ancef.  He does also have a history of back pain MRI of the back in the hospital showed mild edema enhancement of the L5-S1 disc space likely degenerative according the radiologist however discitis osteomyelitis could have that appearance.  He did have a TEE that did suggest small vegetation on his mechanical valve.  He is on Coumadin with a history of the mechanical valve INR today is 2.71: INR is 2.5-3.5.  He continues to complain of some back pain Dr. Dellia Nims did increase his pain management when necessary to see IR 10 mg every 6 hours when necessary he is on OxyContin controlled release 10 mg twice a day routine.   He has some increased lower extremity edema that he is concerned about today-his weights appear to be relatively stable in the lower 2 50 range-she does not complain of any increased shortness of breath or chest pain he does ambulate with his walker about the hallways.  He does state he got a couple doses Lasix in the hospital for edema-per chart review it appears at that point there was no is some concern about vascular congestion thought transitory to IV fluids Past Medical History:  Diagnosis Date  . Cold   . Decreased anal sphincter tone   . Heart valve replaced    1983   [facility]  . Insomnia   . Shortness of breath dyspnea   . Sleep apnea    cpap  . Varicose vein of leg   . Varicose veins           Current Outpatient Prescriptions on File Prior to Visit  Medication Sig Dispense Refill  . acetaminophen (TYLENOL) 325 MG tablet Take 2 tablets (650 mg total) by mouth every 6 (six) hours as needed for mild pain, fever or  headache (or Fever >/= 101).    Marland Kitchen albuterol (PROVENTIL HFA;VENTOLIN HFA) 108 (90 Base) MCG/ACT inhaler Inhale 2 puffs into the lungs every 6 (six) hours as needed for wheezing or shortness of breath. 1 Inhaler 5  . budesonide-formoterol (SYMBICORT) 160-4.5 MCG/ACT inhaler Inhale 2 puffs into the lungs 2 times daily 1 Inhaler 6  . ceFAZolin (ANCEF) 2-4 GM/100ML-% IVPB Inject 100 mLs (2 g total) into the vein every 8 (eight) hours. 9900 mL 0  . fluticasone (FLONASE) 50 MCG/ACT nasal spray Place 1 spray into both nostrils as needed for allergies or rhinitis (as needed for allergy symptoms).    Marland Kitchen gentamicin 280 mg in dextrose 5 % 50 mL Inject 280 mg into the vein daily. 3920 mg 0  . lisinopril (PRINIVIL,ZESTRIL) 20 MG tablet Take 20 mg by mouth daily.    Marland Kitchen loratadine (CLARITIN) 10 MG tablet Take 10 mg by mouth daily as needed for allergies.     . Menthol, Topical Analgesic, (BIOFREEZE) 4 % GEL Apply a small amount to the spine prn    . methocarbamol (ROBAXIN) 500 MG tablet Take 1 tablet (500 mg total) by mouth every 8 (eight) hours as needed for muscle spasms.    . Multiple Vitamins-Minerals (CENTRUM SILVER ADULT 50+ PO) Take 1 tablet by mouth daily with breakfast.    . omeprazole (PRILOSEC)  20 MG capsule Take 1 capsule by mouth daily.    . Oxycodone HCl 10 MG TABS Take 10 mg by mouth 2 (two) times daily.    Marland Kitchen senna (SENOKOT) 8.6 MG TABS tablet Take 1 tablet by mouth 2 (two) times daily. Hold for diarrhea    .  Oxycodone 10 mg every 6 hours when necessary breakthrough pain      . warfarin (COUMADIN) 10 MG tablet Take 10 mg by mouth daily. Take 10 mg by mouth daily    . zolpidem (AMBIEN) 10 MG tablet Take 10 mg by mouth at bedtime.        reports that he has never smoked. He has never used smokeless tobacco. He reports that he drinks alcohol. He reports that he does not use drugs.  Review of systems  General does not complaining of fever chills.  Skin does not  complaining of rashes or itching.  Head ears eyes nose mouth and throat does not complaining of sore throat visual changes.  Respiratory denies shortness of breath or increased cough.  Cardiac does not use any chest pain continues to have increased lower extremity edema.  GI is not complaining of abdominal pain.  GU does not complain of dysuria.  Muscle skeletal continues to complain of some lower right back discomfort which makes it difficult to sleep. And get out of his chair-  Neurologic is not complaining of dizziness or headache.  Psych does not complain of overt depression or anxiety.  . Physical exam.  Temperature 98.7 pulse 86 respirations 20 blood pressure 130/75 last noted weight 253 previous weights appear to be 251-250.4-248.8-252.5.  In general this is a well-nourished middle-aged male does not appear to be in any acute distress.  His skin is warm and dry.  Chest is clear to auscultation there is no labored breathing.  Heart is regular rate and rhythm with a click mechanical-he has 1+ edema most noticeable on his feet this is somewhat pitting.  It is cool to touch.  Abdomen is soft nontender with positive bowel sounds.  Muscle skeletal continues to have tenderness to his right lower back do not note any deformities still has difficulty sleeping and getting out of his chair he is ambulatory with a walker.  Neurologic is grossly intact no lateralizing findings her speech is clear.  Labs.  Marland Kitchen    09/06/2016.  INR 2.71.  09/05/2016.  INR 2.45.  WBC 6.6 hemoglobin 9.7 platelets 352   Sodium 133 potassium 4.4 BUN 30 creatinine 1.07   Assessment plan.  #1 lower extremity edema more no smokeless feet-this may be dependent related-will check a BNP tomorrow.  Also will update a CMP as well as a CBC with differential.  Also will have patient weighed tomorrow notify provider of any weight gain-monitor closely above signs pulse ox every shift for every 4  hours 2 and then every shift.  #2 in regards to the back pain which appears to be persisting I did discuss this fairly extensively with patient and his wife will write an order to contact Dr. Michel Bickers from infectious disease tomorrow morning about seeing patient if possible-his wife will call  as well as this facility-Will await those results.  Also will need follow-up tomorrow.  At this point continues on the oxycodone controlled release to as well as OxyIR for breakthrough pain  #3 history of artificial heart valve INR is therapeutic at 2.71 will continue current Coumadin dose and recheck this tomorrow.   [id number]   .  [id number]

## 2016-09-07 ENCOUNTER — Encounter: Payer: Self-pay | Admitting: Internal Medicine

## 2016-09-07 ENCOUNTER — Telehealth: Payer: Self-pay

## 2016-09-07 ENCOUNTER — Non-Acute Institutional Stay (SKILLED_NURSING_FACILITY): Payer: 59 | Admitting: Internal Medicine

## 2016-09-07 ENCOUNTER — Other Ambulatory Visit: Payer: Self-pay | Admitting: *Deleted

## 2016-09-07 ENCOUNTER — Other Ambulatory Visit (HOSPITAL_COMMUNITY)
Admission: RE | Admit: 2016-09-07 | Discharge: 2016-09-07 | Disposition: A | Discharge status: Home / Self Care | Payer: 59 | Source: Skilled Nursing Facility | Attending: Pediatrics | Admitting: Pediatrics

## 2016-09-07 DIAGNOSIS — Z952 Presence of prosthetic heart valve: Secondary | ICD-10-CM | POA: Diagnosis not present

## 2016-09-07 DIAGNOSIS — M6281 Muscle weakness (generalized): Secondary | ICD-10-CM | POA: Diagnosis present

## 2016-09-07 DIAGNOSIS — R262 Difficulty in walking, not elsewhere classified: Secondary | ICD-10-CM | POA: Diagnosis present

## 2016-09-07 DIAGNOSIS — M4646 Discitis, unspecified, lumbar region: Secondary | ICD-10-CM | POA: Diagnosis present

## 2016-09-07 DIAGNOSIS — Z48812 Encounter for surgical aftercare following surgery on the circulatory system: Secondary | ICD-10-CM | POA: Diagnosis present

## 2016-09-07 DIAGNOSIS — R600 Localized edema: Secondary | ICD-10-CM | POA: Diagnosis not present

## 2016-09-07 DIAGNOSIS — M545 Low back pain, unspecified: Secondary | ICD-10-CM

## 2016-09-07 DIAGNOSIS — R7881 Bacteremia: Secondary | ICD-10-CM | POA: Diagnosis present

## 2016-09-07 DIAGNOSIS — I38 Endocarditis, valve unspecified: Secondary | ICD-10-CM | POA: Insufficient documentation

## 2016-09-07 LAB — CBC WITH DIFFERENTIAL/PLATELET
BASOS ABS: 0 10*3/uL (ref 0.0–0.1)
BASOS PCT: 1 %
Eosinophils Absolute: 0.1 10*3/uL (ref 0.0–0.7)
Eosinophils Relative: 1 %
HEMATOCRIT: 30.2 % — AB (ref 39.0–52.0)
HEMOGLOBIN: 9.9 g/dL — AB (ref 13.0–17.0)
LYMPHS PCT: 20 %
Lymphs Abs: 1.5 10*3/uL (ref 0.7–4.0)
MCH: 28.4 pg (ref 26.0–34.0)
MCHC: 32.8 g/dL (ref 30.0–36.0)
MCV: 86.5 fL (ref 78.0–100.0)
MONOS PCT: 9 %
Monocytes Absolute: 0.7 10*3/uL (ref 0.1–1.0)
NEUTROS ABS: 5.3 10*3/uL (ref 1.7–7.7)
NEUTROS PCT: 69 %
Platelets: 370 10*3/uL (ref 150–400)
RBC: 3.49 MIL/uL — ABNORMAL LOW (ref 4.22–5.81)
RDW: 13.5 % (ref 11.5–15.5)
WBC: 7.6 10*3/uL (ref 4.0–10.5)

## 2016-09-07 LAB — COMPREHENSIVE METABOLIC PANEL
ALT: 12 U/L — AB (ref 17–63)
AST: 24 U/L (ref 15–41)
Albumin: 3.3 g/dL — ABNORMAL LOW (ref 3.5–5.0)
Alkaline Phosphatase: 100 U/L (ref 38–126)
Anion gap: 6 (ref 5–15)
BUN: 25 mg/dL — ABNORMAL HIGH (ref 6–20)
CHLORIDE: 99 mmol/L — AB (ref 101–111)
CO2: 29 mmol/L (ref 22–32)
CREATININE: 1.08 mg/dL (ref 0.61–1.24)
Calcium: 9.3 mg/dL (ref 8.9–10.3)
GFR calc Af Amer: >60 mL/min (ref >60)
GFR calc non Af Amer: >60 mL/min (ref >60)
GLUCOSE: 94 mg/dL (ref 65–99)
Potassium: 4.5 mmol/L (ref 3.5–5.1)
SODIUM: 134 mmol/L — AB (ref 135–145)
Total Bilirubin: 0.4 mg/dL (ref 0.3–1.2)
Total Protein: 8 g/dL (ref 6.5–8.1)

## 2016-09-07 LAB — PROTIME-INR
INR: 2.97
Prothrombin Time: 31.5 seconds — ABNORMAL HIGH (ref 11.4–15.2)

## 2016-09-07 LAB — PROTEIN, URINE, RANDOM: Total Protein, Urine: 19 mg/dL

## 2016-09-07 LAB — BRAIN NATRIURETIC PEPTIDE: B Natriuretic Peptide: 78 pg/mL (ref 0.0–100.0)

## 2016-09-07 MED ORDER — OXYCODONE HCL ER 10 MG PO T12A: EXTENDED_RELEASE_TABLET | ORAL | 0 refills | Status: DC

## 2016-09-07 NOTE — Telephone Encounter (Signed)
[facility] #[phone] Fax: [phone]   

## 2016-09-07 NOTE — Progress Notes (Signed)
Location:   [place] Room Number: 123/P Place of Service:  SNF (31) Provider:  Hedwig Morton, PA-C  Patient Care Team: Dewayne Shorter, PA-C as PCP - General (Physician Assistant)  Extended Emergency Contact Information Primary Emergency Contact: Clarisa Kindred [place] Phone: [phone] Relation: Spouse  Code Status:  Full Code Goals of care: Advanced Directive information Advanced Directives 09/07/2016  Does Patient Have a Medical Advance Directive? Yes  Type of Advance Directive (No Data)  Does patient want to make changes to medical advance directive? No - Patient declined  Copy of [facility] in Chart? -  Would patient like information on creating a medical advance directive? No - Patient declined     Chief Complaint  Patient presents with  . Acute Visit  Follow-up lower extremity edema-back pain-  HPI:  Pt is a 61 y.o. male seen today for an acute visit for follow-up of back pain as well as lower extremity edema.  He was in yesterday for among other issues increased lower extremity edema-his weight today actually still stability at 250 pounds he does not complain of any chest pain or shortness of breath.  He does have a history of artificial heart valve with possible vegetation-as well as history of MSSA sepsis.  He is a long-term six-week course of IV Ancef he is completing a course of 2 weeks of IV gentamicin.  We did order a BNP for today which was unremarkable at 78 his blood work appears to be stable.  He does have a history of the artificial heart valve INR today is 2.97 which is in therapeutic range.  He continues to complain of significant lower back pain which makes it difficult to sleep and get out of his chair apparently he sees most the time in his chair.  He does ambulate however with his walker about the hallway.  He is currently on OxyContin controlled release  10 mg twice a day as well as OxyIR 10 mg every 6 hours for breakthrough pain.  There have been attempts to arrange an infectious disease appointment as soon as possible however his physician is currently on vacation and apparently appointment cannot be obtained until later this month.  In regards to his edema I was able to talk to cardiology Dr. Carlyle Dolly who will apparently see the patient next week.  Currently his vital signs are stable but he and his wife continue to be concerned about the back pain and edema.     Past Medical History:  Diagnosis Date  . Cold   . Decreased anal sphincter tone   . Heart valve replaced    [facility]  . Insomnia   . Shortness of breath dyspnea   . Sleep apnea    cpap  . Varicose vein of leg   . Varicose veins    Past Surgical History:  Procedure Laterality Date  . AORTIC VALVE REPLACEMENT  1983  . FLUOROSCOPY GUIDANCE N/A 08/24/2016   Procedure: Fluoroscopy Guidance;  Surgeon: Leonie Man, MD;  Location: [facility];  Service: Cardiovascular;  Laterality: N/A;  . TEE WITHOUT CARDIOVERSION N/A 08/24/2016   Procedure: TRANSESOPHAGEAL ECHOCARDIOGRAM (TEE);  Surgeon: Pixie Casino, MD;  Location: [facility];  Service: Cardiovascular;  Laterality: N/A;  . TONSILLECTOMY    . VEIN LIGATION AND STRIPPING Left 08/15/2014   Procedure: SAPHENOUS VEIN LIGATION AND EXCISION OF VARICOSITIES;  Surgeon: Nelda Severe  Kellie Simmering, MD;  Location: [facility];  Service: Vascular;  Laterality: Left;    No Known Allergies  Current Outpatient Prescriptions on File Prior to Visit  Medication Sig Dispense Refill  . acetaminophen (TYLENOL) 325 MG tablet Take 2 tablets (650 mg total) by mouth every 6 (six) hours as needed for mild pain, fever or headache (or Fever >/= 101).    Marland Kitchen albuterol (PROVENTIL HFA;VENTOLIN HFA) 108 (90 Base) MCG/ACT inhaler Inhale 2 puffs into the lungs every 6 (six) hours as needed for wheezing or shortness of breath. 1 Inhaler 5  .  budesonide-formoterol (SYMBICORT) 160-4.5 MCG/ACT inhaler Inhale 2 puffs into the lungs 2 times daily 1 Inhaler 6  . ceFAZolin (ANCEF) 2-4 GM/100ML-% IVPB Inject 100 mLs (2 g total) into the vein every 8 (eight) hours. 9900 mL 0  . fluticasone (FLONASE) 50 MCG/ACT nasal spray Place 1 spray into both nostrils as needed for allergies or rhinitis (as needed for allergy symptoms).    Marland Kitchen lisinopril (PRINIVIL,ZESTRIL) 20 MG tablet Take 20 mg by mouth daily.    Marland Kitchen loratadine (CLARITIN) 10 MG tablet Take 10 mg by mouth daily as needed for allergies.     . Menthol, Topical Analgesic, (BIOFREEZE) 4 % GEL Apply a small amount to the spine prn    . methocarbamol (ROBAXIN) 500 MG tablet Take 1 tablet (500 mg total) by mouth every 8 (eight) hours as needed for muscle spasms.    . Multiple Vitamins-Minerals (CENTRUM SILVER ADULT 50+ PO) Take 1 tablet by mouth daily with breakfast.    . omeprazole (PRILOSEC) 20 MG capsule Take 1 capsule by mouth daily.    . Oxycodone HCl 10 MG TABS Take 10 mg by mouth 2 (two) times daily. May give additional 10 mg by mouth every 6 hours prn    . senna (SENOKOT) 8.6 MG TABS tablet Take 1 tablet by mouth 2 (two) times daily. Hold for diarrhea    . warfarin (COUMADIN) 7.5 MG tablet Take 10 mg by mouth daily. Take 10 mg by mouth daily    . zolpidem (AMBIEN) 10 MG tablet Take 10 mg by mouth at bedtime.      No current facility-administered medications on file prior to visit.      Review of Systems General does not complaining of fever chills.  Skin does not complaining of rashes or itching.  Head ears eyes nose mouth and throat does not complaining of sore throat visual changes.  Respiratory denies shortness of breath or increased cough.  Cardiac does not use any chest pain continues to have increased lower extremity edema. Which he is concerned about  GI is not complaining of abdominal pain.  GU does not complain of dysuria.  Muscle skeletal continues to complain  of some lower right back discomfort which makes it difficult to sleep. And get out of his chair-  Neurologic is not complaining of dizziness or headache.  Psych does not complain of overt depression or anxiety. Immunization History  Administered Date(s) Administered  . Influenza, Seasonal, Injecte, Preservative Fre 04/14/2016  . Influenza-Unspecified 04/03/2014   Pertinent  Health Maintenance Due  Topic Date Due  . COLONOSCOPY  05/18/2005  . INFLUENZA VACCINE  Completed   No flowsheet data found. Functional Status Survey:    Vitals:   09/07/16 1510  BP: (!) 119/56  Pulse: 74  Resp: 18  Temp: 98.6 F (37 C)  TempSrc: Oral  SpO2: 98%  Weight is 250 pounds  Physical Exam  In general this  is a well-nourished middle-aged male does not appear to be in any acute distress.  His skin is warm and dry.  Chest is clear to auscultation there is no labored breathing.  Heart is regular rate and rhythm with a click mechanical-he has 1+ edema most noticeable on his feet this is pitting it is cool to touch non-erythematous appears relatively baseline with exam yesterday    Abdomen is soft nontender with positive bowel sounds.  Muscle skeletal continues to have tenderness to his right lower back do not note any deformities still has difficulty sleeping and getting out of his chair he continues to ambulate  with a walker  Neurologic is grossly intact no lateralizing findings her speech is clear  Psych he is alert and oriented pleasant and appropriate  Labs reviewed:  Recent Labs  09/01/16 0800 09/04/16 1742 09/05/16 0820 09/07/16 0800  NA 133*  --  133* 134*  K 4.0  --  4.4 4.5  CL 96*  --  98* 99*  CO2 28  --  25 29  GLUCOSE 114*  --  107* 94  BUN 19  --  30* 25*  CREATININE 0.87 1.22 1.07 1.08  CALCIUM 8.8*  --  9.3 9.3    Recent Labs  08/29/16 0900 09/01/16 0800 09/07/16 0800  AST 64* 25 24  ALT 48 20 12*  ALKPHOS 159* 118 100  BILITOT 0.5 0.6 0.4    PROT 7.4 7.5 8.0  ALBUMIN 3.2* 3.1* 3.3*    Recent Labs  08/29/16 0900 09/05/16 0820 09/07/16 0800  WBC 9.2 6.6 7.6  NEUTROABS 7.4 4.3 5.3  HGB 10.2* 9.7* 9.9*  HCT 31.8* 29.7* 30.2*  MCV 88.1 86.8 86.5  PLT 365 352 370   No results found for: TSH No results found for: HGBA1C Lab Results  Component Value Date   CHOL 174 03/08/2016   HDL 47 03/08/2016   LDLCALC 96 03/08/2016   TRIG 157 (H) 03/08/2016   CHOLHDL 3.7 03/08/2016    Significant Diagnostic Results in last 30 days:  Dg Chest 2 View  Result Date: 08/25/2016 CLINICAL DATA:  61 y/o male inpatient with fever, SOB x1 week. Hx aortic valve replacement, HTN - on meds, non-smoker EXAM: CHEST - 2 VIEW COMPARISON:  08/23/2016 FINDINGS: Lungs clear.  Interval right PICC placement to distal SVC. Heart size and mediastinal contours are within normal limits. Tortuous thoracic aorta. No effusion. Previous median sternotomy and AVR. IMPRESSION: No acute disease post AVR. Electronically Signed   By: Lucrezia Europe M.D.   On: 08/25/2016 16:00   Dg Chest 2 View  Result Date: 08/23/2016 CLINICAL DATA:  Shortness of breath EXAM: CHEST  2 VIEW COMPARISON:  08/17/2016 FINDINGS: Post sternotomy changes. No acute infiltrate or effusion. Stable borderline cardiomegaly. No overt edema. No pneumothorax. IMPRESSION: No radiographic evidence for acute cardiopulmonary abnormality Electronically Signed   By: Donavan Foil M.D.   On: 08/23/2016 21:24   Dg Chest 2 View  Result Date: 08/17/2016 CLINICAL DATA:  Body aches and fever EXAM: CHEST  2 VIEW COMPARISON:  None. FINDINGS: Cardiomediastinal silhouette is upper limits normal. No focal airspace consolidation or pulmonary edema. No pneumothorax or pleural effusion. IMPRESSION: No active cardiopulmonary disease. Electronically Signed   By: Ulyses Jarred M.D.   On: 08/17/2016 22:24   Ct Head Wo Contrast  Result Date: 08/17/2016 CLINICAL DATA:  Altered mental status and dizziness EXAM: CT HEAD WITHOUT  CONTRAST TECHNIQUE: Contiguous axial images were obtained from the base of the  skull through the vertex without intravenous contrast. COMPARISON:  None. FINDINGS: Brain: No mass lesion, intraparenchymal hemorrhage or extra-axial collection. No evidence of acute cortical infarct. Brain parenchyma and CSF-containing spaces are normal for age. Vascular: No hyperdense vessel or unexpected calcification. Skull: Normal visualized skull base, calvarium and extracranial soft tissues. Sinuses/Orbits: No sinus fluid levels or advanced mucosal thickening. No mastoid effusion. Normal orbits. IMPRESSION: Normal CT of the brain. Electronically Signed   By: Ulyses Jarred M.D.   On: 08/17/2016 22:03   Mr Lumbar Spine W Wo Contrast  Result Date: 08/23/2016 CLINICAL DATA:  61 y/o M; MSSA bacteremia with concern for lumbar spine infection. EXAM: MRI LUMBAR SPINE WITHOUT AND WITH CONTRAST TECHNIQUE: Multiplanar and multiecho pulse sequences of the lumbar spine were obtained without and with intravenous contrast. CONTRAST:  53mL MULTIHANCE GADOBENATE DIMEGLUMINE 529 MG/ML IV SOLN COMPARISON:  None. FINDINGS: Segmentation:  Standard. Alignment: Grade 1 L5-S1 anterolisthesis and chronic L5 pars defects. Vertebrae: Mild edema and enhancement of the L5-S1 disc space and endplates. Small bilateral L4-5 facet effusions without significant edema or enhancement in the facets. No evidence for acute fracture. Conus medullaris: Extends to the L1 level and appears normal. No epidural fluid collection or enhancement of the cauda equina. Paraspinal and other soft tissues: Negative. Disc levels: L1-2: Small disc bulge and mild facet hypertrophy with mild foraminal narrowing. No significant canal stenosis. L2-3: Small disc bulge and mild facet hypertrophy with mild foraminal narrowing. No significant canal stenosis. L3-4: Small disc bulge and mild facet hypertrophy with mild bilateral foraminal narrowing. No significant canal stenosis. Small right  facet effusion. L4-5: Small disc bulge and moderate left-greater-than-right facet hypertrophy with mild left-greater-than-right foraminal narrowing. No significant canal stenosis. Small bilateral facet effusions. L5-S1: Anterolisthesis with uncovered disc bulge and moderate bilateral facet hypertrophy greater on the right. Mild left and moderate right foraminal narrowing. There is probable impingement of the right L5 nerve root in the right neural foramen (series 7, image 3). IMPRESSION: 1. Mild edema and enhancement at the L5-S1 disc space and endplates is likely degenerative related to chronic L5 pars defects and grade 1 anterolisthesis. Early discitis osteomyelitis could have this appearance but is considered less likely. 2. Right C3-4 and bilateral C4-5 facet effusions, likely degenerative. 3. Lumbar spondylosis with multilevel mild foraminal narrowing. 4. At the right L5-S1 neural foramen there is moderate narrowing and probable impingement of the exiting L5 nerve root. Electronically Signed   By: Kristine Garbe M.D.   On: 08/23/2016 19:01   US Abdomen Limited Ruq  Result Date: 08/18/2016 CLINICAL DATA:  Elevated liver function tests. EXAM: US ABDOMEN LIMITED - RIGHT UPPER QUADRANT COMPARISON:  None. FINDINGS: Gallbladder: No gallstones or wall thickening visualized. No sonographic Murphy sign noted by sonographer. Common bile duct: Diameter: 0.2 cm Liver: No focal lesion or intrahepatic biliary ductal dilatation. Diffusely increased echogenicity is seen. IMPRESSION: Fatty infiltration of the liver. The examination is otherwise negative. Electronically Signed   By: Inge Rise M.D.   On: 08/18/2016 15:58    Assessment/Plan #1 lower extremity edema-BNP was reassuring-patient and wife would like cardiology follow-up-his wife did have a contact number for: [facility]-I was able to talk to Dr. Roderic Palau branch who indicates he will see patient next week for follow-up.  He is not  complaining of any increased shortness breath or chest pain.  We will start low-dose Lasix here 20 mg a day with low-dose potassium 10 mEq a day continue to monitor weights and edema status  Of note albumin on lab today was 3.3. Also will get a spot urine for protein   #2 low back pain-again attempts were made to contact infectious disease apparentlyDr. Megan Salon was out of town currently-and earliest appointment is later in the month-Dr. Dellia Nims also did evaluate patient-at this point will order an MRI of the lumbar spine-also will obtain a sedimentation rate and C-reactive protein tomorrow  #3 Coumadin management with history of artificial heart valve INR today is 2.97 will reduce Coumadin down to 10 mg daily recheck an INR tomorrow-there's been no increased bruising or bleeding noted.   Of note patient was seen by me as well as Dr. Dellia Nims today who participated in coordinating this plan of care.  [id number] greater than 35 minutes spent assessing patient-discussing his status with his wife at bedside as well as with patient-as well as input by Dr. Dellia Nims who also evaluated the patient.--And coordinating the plan of care-of note greater than 50% of time spent coordinating plan of care with input from patient and his wife as well as consultation with Dr. Dellia Nims    -

## 2016-09-07 NOTE — Telephone Encounter (Signed)
Wife is calling with concerns regarding her husband who is having increased swelling in legs and feet   Scheduled visit with Dr Megan Salon later in the month.   She has asked that we call her husband on his cell. He is still lin the nursing home.   Tried to reach patient:  No answer on cell number given.  [phone].

## 2016-09-08 ENCOUNTER — Encounter (HOSPITAL_COMMUNITY)
Admission: RE | Admit: 2016-09-08 | Discharge: 2016-09-08 | Disposition: A | Discharge status: Home / Self Care | Payer: 59 | Source: Skilled Nursing Facility | Attending: *Deleted | Admitting: *Deleted

## 2016-09-08 LAB — URINALYSIS, ROUTINE W REFLEX MICROSCOPIC
Bacteria, UA: NONE SEEN
Bilirubin Urine: NEGATIVE
GLUCOSE, UA: NEGATIVE mg/dL
Ketones, ur: NEGATIVE mg/dL
Leukocytes, UA: NEGATIVE
Nitrite: NEGATIVE
PH: 5 (ref 5.0–8.0)
Protein, ur: NEGATIVE mg/dL
Specific Gravity, Urine: 1.016 (ref 1.005–1.030)

## 2016-09-08 LAB — PROTIME-INR
INR: 3.23
PROTHROMBIN TIME: 33.7 s — AB (ref 11.4–15.2)

## 2016-09-08 LAB — BASIC METABOLIC PANEL
ANION GAP: 9 (ref 5–15)
BUN: 27 mg/dL — ABNORMAL HIGH (ref 6–20)
CALCIUM: 9 mg/dL (ref 8.9–10.3)
CO2: 26 mmol/L (ref 22–32)
CREATININE: 1.08 mg/dL (ref 0.61–1.24)
Chloride: 100 mmol/L — ABNORMAL LOW (ref 101–111)
GFR calc non Af Amer: >60 mL/min (ref >60)
Glucose, Bld: 121 mg/dL — ABNORMAL HIGH (ref 65–99)
Potassium: 4.1 mmol/L (ref 3.5–5.1)
SODIUM: 135 mmol/L (ref 135–145)

## 2016-09-08 LAB — C-REACTIVE PROTEIN: CRP: 6.9 mg/dL — ABNORMAL HIGH (ref <1.0)

## 2016-09-08 LAB — SEDIMENTATION RATE: SED RATE: 86 mm/h — AB (ref 0–16)

## 2016-09-09 ENCOUNTER — Encounter (HOSPITAL_COMMUNITY)
Admission: RE | Admit: 2016-09-09 | Discharge: 2016-09-09 | Disposition: A | Discharge status: Home / Self Care | Payer: 59 | Source: Skilled Nursing Facility | Attending: *Deleted | Admitting: *Deleted

## 2016-09-09 LAB — PROTIME-INR
INR: 2.98
PROTHROMBIN TIME: 31.6 s — AB (ref 11.4–15.2)

## 2016-09-09 LAB — HEMOGLOBIN A1C
Hgb A1c MFr Bld: 5.5 % (ref 4.8–5.6)
MEAN PLASMA GLUCOSE: 111 mg/dL

## 2016-09-12 ENCOUNTER — Ambulatory Visit (HOSPITAL_COMMUNITY)
Admission: RE | Admit: 2016-09-12 | Discharge: 2016-09-12 | Disposition: A | Discharge status: Home / Self Care | Payer: 59 | Source: Ambulatory Visit | Attending: Internal Medicine | Admitting: Internal Medicine

## 2016-09-12 ENCOUNTER — Encounter (HOSPITAL_COMMUNITY)
Admission: RE | Admit: 2016-09-12 | Discharge: 2016-09-12 | Disposition: A | Discharge status: Home / Self Care | Payer: 59 | Source: Skilled Nursing Facility | Attending: *Deleted | Admitting: *Deleted

## 2016-09-12 DIAGNOSIS — R600 Localized edema: Secondary | ICD-10-CM | POA: Diagnosis not present

## 2016-09-12 DIAGNOSIS — M5187 Other intervertebral disc disorders, lumbosacral region: Secondary | ICD-10-CM | POA: Diagnosis not present

## 2016-09-12 DIAGNOSIS — Z7901 Long term (current) use of anticoagulants: Secondary | ICD-10-CM | POA: Insufficient documentation

## 2016-09-12 DIAGNOSIS — I38 Endocarditis, valve unspecified: Secondary | ICD-10-CM | POA: Diagnosis present

## 2016-09-12 DIAGNOSIS — M5186 Other intervertebral disc disorders, lumbar region: Secondary | ICD-10-CM | POA: Diagnosis not present

## 2016-09-12 LAB — BASIC METABOLIC PANEL
Anion gap: 8 (ref 5–15)
BUN: 25 mg/dL — AB (ref 6–20)
CHLORIDE: 101 mmol/L (ref 101–111)
CO2: 27 mmol/L (ref 22–32)
Calcium: 9.2 mg/dL (ref 8.9–10.3)
Creatinine, Ser: 1.22 mg/dL (ref 0.61–1.24)
GFR calc Af Amer: >60 mL/min (ref >60)
GFR calc non Af Amer: >60 mL/min (ref >60)
Glucose, Bld: 105 mg/dL — ABNORMAL HIGH (ref 65–99)
POTASSIUM: 4.1 mmol/L (ref 3.5–5.1)
Sodium: 136 mmol/L (ref 135–145)

## 2016-09-12 LAB — CBC WITH DIFFERENTIAL/PLATELET
Basophils Absolute: 0 10*3/uL (ref 0.0–0.1)
Basophils Relative: 1 %
Eosinophils Absolute: 0.1 10*3/uL (ref 0.0–0.7)
Eosinophils Relative: 2 %
HCT: 29.9 % — ABNORMAL LOW (ref 39.0–52.0)
HEMOGLOBIN: 9.7 g/dL — AB (ref 13.0–17.0)
LYMPHS ABS: 1.4 10*3/uL (ref 0.7–4.0)
Lymphocytes Relative: 23 %
MCH: 28.3 pg (ref 26.0–34.0)
MCHC: 32.4 g/dL (ref 30.0–36.0)
MCV: 87.2 fL (ref 78.0–100.0)
Monocytes Absolute: 0.5 10*3/uL (ref 0.1–1.0)
Monocytes Relative: 7 %
NEUTROS PCT: 67 %
Neutro Abs: 4.3 10*3/uL (ref 1.7–7.7)
Platelets: 312 10*3/uL (ref 150–400)
RBC: 3.43 MIL/uL — AB (ref 4.22–5.81)
RDW: 13.4 % (ref 11.5–15.5)
WBC: 6.4 10*3/uL (ref 4.0–10.5)

## 2016-09-12 LAB — PROTIME-INR
INR: 3.06
Prothrombin Time: 32.3 seconds — ABNORMAL HIGH (ref 11.4–15.2)

## 2016-09-12 MED ORDER — GADOBENATE DIMEGLUMINE 529 MG/ML IV SOLN
20.0000 mL | Freq: Once | INTRAVENOUS | Status: AC | PRN
  Administered 2016-09-12: 20 mL via INTRAVENOUS
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
## 2016-09-13 ENCOUNTER — Ambulatory Visit (INDEPENDENT_AMBULATORY_CARE_PROVIDER_SITE_OTHER): Payer: 59 | Admitting: Interventional Cardiology

## 2016-09-13 ENCOUNTER — Encounter: Payer: Self-pay | Admitting: Interventional Cardiology

## 2016-09-13 VITALS — BP 102/66 | HR 79 | Ht 72.0 in | Wt 247.2 lb

## 2016-09-13 DIAGNOSIS — Z952 Presence of prosthetic heart valve: Secondary | ICD-10-CM | POA: Diagnosis not present

## 2016-09-13 DIAGNOSIS — I38 Endocarditis, valve unspecified: Principal | ICD-10-CM

## 2016-09-13 DIAGNOSIS — R600 Localized edema: Secondary | ICD-10-CM

## 2016-09-13 DIAGNOSIS — Z7901 Long term (current) use of anticoagulants: Secondary | ICD-10-CM | POA: Diagnosis not present

## 2016-09-13 DIAGNOSIS — T826XXD Infection and inflammatory reaction due to cardiac valve prosthesis, subsequent encounter: Secondary | ICD-10-CM

## 2016-09-13 MED ORDER — FUROSEMIDE 40 MG PO TABS
40.0000 mg | ORAL_TABLET | Freq: Two times a day (BID) | ORAL | 3 refills | Status: DC | PRN
Start: 2016-09-13 — End: 2016-09-14

## 2016-09-13 NOTE — Patient Instructions (Signed)
Medication Instructions:  TAKE furosemide (lasix) 40 mg twice a day as needed.  Labwork: None ordered  Testing/Procedures: Your physician has requested that you have an echocardiogram. Echocardiography is a painless test that uses sound waves to create images of your heart. It provides your doctor with information about the size and shape of your heart and how well your heart's chambers and valves are working. This procedure takes approximately one hour. There are no restrictions for this procedure.    Follow-Up: Your physician recommends that you schedule a follow-up appointment in: 3 months with Dr. Irish Lack.   Any Other Special Instructions Will Be Listed Below (If Applicable).  Echocardiogram An echocardiogram, or echocardiography, uses sound waves (ultrasound) to produce an image of your heart. The echocardiogram is simple, painless, obtained within a short period of time, and offers valuable information to your health care provider. The images from an echocardiogram can provide information such as:  Evidence of coronary artery disease (CAD).  Heart size.  Heart muscle function.  Heart valve function.  Aneurysm detection.  Evidence of a past heart attack.  Fluid buildup around the heart.  Heart muscle thickening.  Assess heart valve function. Tell a health care provider about:  Any allergies you have.  All medicines you are taking, including vitamins, herbs, eye drops, creams, and over-the-counter medicines.  Any problems you or family members have had with anesthetic medicines.  Any blood disorders you have.  Any surgeries you have had.  Any medical conditions you have.  Whether you are pregnant or may be pregnant. What happens before the procedure? No special preparation is needed. Eat and drink normally. What happens during the procedure?  In order to produce an image of your heart, gel will be applied to your chest and a wand-like tool (transducer)  will be moved over your chest. The gel will help transmit the sound waves from the transducer. The sound waves will harmlessly bounce off your heart to allow the heart images to be captured in real-time motion. These images will then be recorded.  You may need an IV to receive a medicine that improves the quality of the pictures. What happens after the procedure? You may return to your normal schedule including diet, activities, and medicines, unless your health care provider tells you otherwise. This information is not intended to replace advice given to you by your health care provider. Make sure you discuss any questions you have with your health care provider. Document Released: 06/17/2000 Document Revised: 02/06/2016 Document Reviewed: 02/25/2013 Elsevier Interactive Patient Education  2017 [employer].    If you need a refill on your cardiac medications before your next appointment, please call your pharmacy.

## 2016-09-14 ENCOUNTER — Non-Acute Institutional Stay (SKILLED_NURSING_FACILITY): Payer: 59 | Admitting: Internal Medicine

## 2016-09-14 ENCOUNTER — Encounter: Payer: Self-pay | Admitting: Internal Medicine

## 2016-09-14 ENCOUNTER — Other Ambulatory Visit (HOSPITAL_COMMUNITY)
Admission: RE | Admit: 2016-09-14 | Discharge: 2016-09-14 | Disposition: A | Discharge status: Home / Self Care | Payer: 59 | Source: Skilled Nursing Facility | Attending: Internal Medicine | Admitting: Internal Medicine

## 2016-09-14 DIAGNOSIS — I10 Essential (primary) hypertension: Secondary | ICD-10-CM

## 2016-09-14 DIAGNOSIS — I38 Endocarditis, valve unspecified: Secondary | ICD-10-CM | POA: Insufficient documentation

## 2016-09-14 DIAGNOSIS — Z952 Presence of prosthetic heart valve: Secondary | ICD-10-CM

## 2016-09-14 DIAGNOSIS — A4101 Sepsis due to Methicillin susceptible Staphylococcus aureus: Secondary | ICD-10-CM

## 2016-09-14 DIAGNOSIS — M4646 Discitis, unspecified, lumbar region: Secondary | ICD-10-CM

## 2016-09-14 LAB — PROTIME-INR
INR: 2.97
PROTHROMBIN TIME: 31.6 s — AB (ref 11.4–15.2)

## 2016-09-14 NOTE — Progress Notes (Signed)
Location:   [facility] Room Number: 123/P Place of Service:  SNF (31)  Provider: Granville Lewis  PCP: Charolette Forward, PA-C Patient Care Team: Dewayne Shorter, PA-C as PCP - General (Physician Assistant)  Extended Emergency Contact Information Primary Emergency Contact: Clarisa [place] Phone: [phone] Relation: Spouse  Code Status: Full Code Goals of care:  Advanced Directive information Advanced Directives 09/14/2016  Does Patient Have a Medical Advance Directive? Yes  Type of Advance Directive (No Data)  Does patient want to make changes to medical advance directive? No - Patient declined  Copy of [place] in Chart? -  Would patient like information on creating a medical advance directive? No - Patient declined     No Known Allergies  Chief Complaint  Patient presents with  . Discharge Note    HPI:  61 y.o. male  seen today for discharge from facility tomorrow.  He has been here for rehabilitation as well as a prolonged course of IV antibiotics which she continues on.  He has a history of an artificial heart valve a possible vegetation as well as a history of MSSA sepsis.  He is completing a six-week course of IV Ancef-he has already completed 2 weeks of IV gentamicin.  His course postop has been complicated by back painHe had an MRI of the back that showed mild edema and enhancement of the L5-S1 disc space likely degenerative according to the radiologist however early discitis osteomyelitis could have this appearance. He has lumbar spinal low-dose is with multiple mild foraminal narrowing. At the right L5-S1 there is moderate narrowing and impingement of L5  Repeat MRI on 09/12/2016 showed progressive changes of edema and enhancement effect in the inferior endplate of L5 and superior endplate S1.  Suggestion of possible indolent infection rather than degenerative change    This was  discussed with Dr. Dellia Nims and thought to be not surprising his says his back pain is improving somewhat he appears to be somewhat more ambulatory is sleeping a bit better at nig  ht He's also been seen by his cardiologist earlier this week-with suggestion to increase his Lasix to 40 mg a day from 20-she has had some increased lower extremity edema-he also has a cardiac echo scheduled for mid May when he has completed his IV antibiotics.  He will also have infectious disease follow-up by Dr. Megan Salon later this month         Past Medical History:  Diagnosis Date  . Cold   . Decreased anal sphincter tone   . Heart valve replaced    1983   [facility]  . Insomnia   . Shortness of breath dyspnea   . Sleep apnea    cpap  . Varicose vein of leg   . Varicose veins     Past Surgical History:  Procedure Laterality Date  . AORTIC VALVE REPLACEMENT  1983  . FLUOROSCOPY GUIDANCE N/A 08/24/2016   Procedure: Fluoroscopy Guidance;  Surgeon: Leonie Man, MD;  Location: [facility];  Service: Cardiovascular;  Laterality: N/A;  . TEE WITHOUT CARDIOVERSION N/A 08/24/2016   Procedure: TRANSESOPHAGEAL ECHOCARDIOGRAM (TEE);  Surgeon: Pixie Casino, MD;  Location: [facility] ENDOSCOPY;  Service: Cardiovascular;  Laterality: N/A;  . TONSILLECTOMY    . VEIN LIGATION AND STRIPPING Left 08/15/2014   Procedure: SAPHENOUS VEIN LIGATION AND EXCISION OF VARICOSITIES;  Surgeon: Mal Misty, MD;  Location: [facility];  Service: Vascular;  Laterality: Left;  reports that he has never smoked. He has never used smokeless tobacco. He reports that he drinks alcohol. He reports that he does not use drugs. Social History   Social History  . Marital status: Married    Spouse name: N/A  . Number of children: 1  . Years of education: N/A   Occupational History  . Database administrator    Social History Main Topics  . Smoking status: Never Smoker  . Smokeless tobacco: Never Used  . Alcohol use 0.0  oz/week     Comment: wine at night  . Drug use: No  . Sexual activity: Not on file   Other Topics Concern  . Not on file   Social History Narrative  . No narrative on file   Functional Status Survey:    No Known Allergies  Pertinent  Health Maintenance Due  Topic Date Due  . COLONOSCOPY  05/18/2005  . INFLUENZA VACCINE  Completed    Medications: Current Outpatient Prescriptions on File Prior to Visit  Medication Sig Dispense Refill  . acetaminophen (TYLENOL) 325 MG tablet Take 2 tablets (650 mg total) by mouth every 6 (six) hours as needed for mild pain, fever or headache (or Fever >/= 101).    Marland Kitchen albuterol (PROVENTIL HFA;VENTOLIN HFA) 108 (90 Base) MCG/ACT inhaler Inhale 2 puffs into the lungs every 6 (six) hours as needed for wheezing or shortness of breath. 1 Inhaler 5  . budesonide-formoterol (SYMBICORT) 160-4.5 MCG/ACT inhaler Inhale 2 puffs into the lungs 2 times daily 1 Inhaler 6  . ceFAZolin (ANCEF) 2-4 GM/100ML-% IVPB Inject 100 mLs (2 g total) into the vein every 8 (eight) hours. 9900 mL 0  . Dextromethorphan-Guaifenesin (ROBITUSSIN COUGH+CHEST CONG DM) 5-100 MG/5ML LIQD Give 10 ml by mouth every 4 hours as needed for cough/congestion    . fluticasone (FLONASE) 50 MCG/ACT nasal spray Place 1 spray into both nostrils as needed for allergies or rhinitis (as directed for allergy symptoms).     Marland Kitchen lisinopril (PRINIVIL,ZESTRIL) 20 MG tablet Take 20 mg by mouth daily.    Marland Kitchen loratadine (CLARITIN) 10 MG tablet Take 10 mg by mouth daily as needed for allergies.     . Menthol, Topical Analgesic, (BIOFREEZE) 4 % GEL Apply a small amount to the spine prn    . methocarbamol (ROBAXIN) 500 MG tablet Take 1 tablet (500 mg total) by mouth every 8 (eight) hours as needed for muscle spasms.    . Multiple Vitamins-Minerals (CENTRUM SILVER ADULT 50+ PO) Take 1 tablet by mouth daily with breakfast.    . omeprazole (PRILOSEC) 20 MG capsule Take 1 capsule by mouth daily.    Marland Kitchen oxyCODONE  (OXYCONTIN) 10 mg 12 hr tablet Take one tablet by mouth every 12 hours for pain. Hold for sedation or respiratory depression. Do not crush 60 tablet 0  . potassium chloride (K-DUR) 10 MEQ tablet Take 10 mEq by mouth daily.    Marland Kitchen senna (SENOKOT) 8.6 MG TABS tablet Take 1 tablet by mouth 2 (two) times daily. Hold for diarrhea    . zolpidem (AMBIEN) 10 MG tablet Take 10 mg by mouth at bedtime.      No current facility-administered medications on file prior to visit.      Review of Systems General does not complaining of fever chills.  Skin does not complaining of rashes or itching.  Head ears eyes nose mouth and throat does not complaining of sore throat visual changes.  Respiratory denies shortness of breath or increased cough.  Cardiac does not use any chest pain continues to have increased lower extremity edema. per visit with cardiology yesterday Lasix as been increased  GI is not complaining of abdominal pain.  GU does not complain of dysuria.  Muscle skeletal continues to have some low back pain and stiffness at times was says this is gradually improving  Neurologic is not complaining of dizziness or headache.  Psych does not complain of overt depression or anxiety. Vitals:   09/14/16 1016  BP: 120/82  Pulse: 81  Resp: 20  Temp: 98.2 F (36.8 C)  TempSrc: Oral  Weight is 247.8  Physical Exam In general this is a well-nourished middle-aged male does not appear to be in any distress-appear somewhat more comfortable than previous visits.  His skin is warm and dry. PICC line is in place on right upper arm I do not see any sign of infection drainage bleeding or erythema  Chest is clear to auscultation there is no labored breathing.  Heart is regular rate and rhythm with a click mechanical-he has 1+ edema most noticeable on his feet -she does have compression hose applied bilaterally    Abdomen is soft nontender with positive bowel sounds.  Muscle  skeletal at times continues to have some tenderness to his right lower back but this has improved he is ambulatory moves all extremities at baseline-ambulating pretty well with a rolling walker at this time  Neurologic is grossly intact no lateralizing findings his speech is clear  Psych he is alert and oriented pleasant and appropriate Labs reviewed: Basic Metabolic Panel:  Recent Labs  09/07/16 0800 09/08/16 0800 09/12/16 0530  NA 134* 135 136  K 4.5 4.1 4.1  CL 99* 100* 101  CO2 29 26 27   GLUCOSE 94 121* 105*  BUN 25* 27* 25*  CREATININE 1.08 1.08 1.22  CALCIUM 9.3 9.0 9.2   Liver Function Tests:  Recent Labs  08/29/16 0900 09/01/16 0800 09/07/16 0800  AST 64* 25 24  ALT 48 20 12*  ALKPHOS 159* 118 100  BILITOT 0.5 0.6 0.4  PROT 7.4 7.5 8.0  ALBUMIN 3.2* 3.1* 3.3*   No results for input(s): LIPASE, AMYLASE in the last 8760 hours. No results for input(s): AMMONIA in the last 8760 hours. CBC:  Recent Labs  09/05/16 0820 09/07/16 0800 09/12/16 0530  WBC 6.6 7.6 6.4  NEUTROABS 4.3 5.3 4.3  HGB 9.7* 9.9* 9.7*  HCT 29.7* 30.2* 29.9*  MCV 86.8 86.5 87.2  PLT 352 370 312   Cardiac Enzymes: No results for input(s): CKTOTAL, CKMB, CKMBINDEX, TROPONINI in the last 8760 hours. BNP: Invalid input(s): POCBNP CBG: No results for input(s): GLUCAP in the last 8760 hours.  Procedures and Imaging Studies During Stay: Dg Chest 2 View  Result Date: 08/25/2016 CLINICAL DATA:  61 y/o male inpatient with fever, SOB x1 week. Hx aortic valve replacement, HTN - on meds, non-smoker EXAM: CHEST - 2 VIEW COMPARISON:  08/23/2016 FINDINGS: Lungs clear.  Interval right PICC placement to distal SVC. Heart size and mediastinal contours are within normal limits. Tortuous thoracic aorta. No effusion. Previous median sternotomy and AVR. IMPRESSION: No acute disease post AVR. Electronically Signed   By: Lucrezia Europe M.D.   On: 08/25/2016 16:00   Dg Chest 2 View  Result Date:  08/23/2016 CLINICAL DATA:  Shortness of breath EXAM: CHEST  2 VIEW COMPARISON:  08/17/2016 FINDINGS: Post sternotomy changes. No acute infiltrate or effusion. Stable borderline cardiomegaly. No overt edema. No pneumothorax. IMPRESSION: No radiographic evidence  for acute cardiopulmonary abnormality Electronically Signed   By: Donavan Foil M.D.   On: 08/23/2016 21:24   Dg Chest 2 View  Result Date: 08/17/2016 CLINICAL DATA:  Body aches and fever EXAM: CHEST  2 VIEW COMPARISON:  None. FINDINGS: Cardiomediastinal silhouette is upper limits normal. No focal airspace consolidation or pulmonary edema. No pneumothorax or pleural effusion. IMPRESSION: No active cardiopulmonary disease. Electronically Signed   By: Ulyses Jarred M.D.   On: 08/17/2016 22:24   Ct Head Wo Contrast  Result Date: 08/17/2016 CLINICAL DATA:  Altered mental status and dizziness EXAM: CT HEAD WITHOUT CONTRAST TECHNIQUE: Contiguous axial images were obtained from the base of the skull through the vertex without intravenous contrast. COMPARISON:  None. FINDINGS: Brain: No mass lesion, intraparenchymal hemorrhage or extra-axial collection. No evidence of acute cortical infarct. Brain parenchyma and CSF-containing spaces are normal for age. Vascular: No hyperdense vessel or unexpected calcification. Skull: Normal visualized skull base, calvarium and extracranial soft tissues. Sinuses/Orbits: No sinus fluid levels or advanced mucosal thickening. No mastoid effusion. Normal orbits. IMPRESSION: Normal CT of the brain. Electronically Signed   By: Ulyses Jarred M.D.   On: 08/17/2016 22:03   Mr Lumbar Spine W Wo Contrast  Result Date: 09/12/2016 CLINICAL DATA:  Recent episode of bacteremia. Back pain. Assess for spinal infection. EXAM: MRI LUMBAR SPINE WITHOUT AND WITH CONTRAST TECHNIQUE: Multiplanar and multiecho pulse sequences of the lumbar spine were obtained without and with intravenous contrast. CONTRAST:  38mL MULTIHANCE GADOBENATE  DIMEGLUMINE 529 MG/ML IV SOLN COMPARISON:  08/23/2016 FINDINGS: Segmentation:  5 lumbar type vertebral bodies. Alignment:  5 mm anterolisthesis L5-S1 due to chronic pars defects. Vertebrae: Worsening changes at the L5-S1 disc level worrisome for infection. See below. Conus medullaris: Extends to the L1 level and appears normal. Paraspinal and other soft tissues: Right renal cyst. Disc levels: Mild non-compressive degenerative changes at L4-5 and above. No stenosis or neural compression. Mild facet osteoarthritis at L4-5. L5-S1: Chronic bilateral pars defects with 5 mm anterolisthesis. Edema and enhancement affecting the inferior endplate L5 and superior endplate S1, progressive over the last 3 weeks to the degree that 1 would not expect from ordinary degenerative change. Therefore, findings are worrisome for discitis osteomyelitis at this level, even though there is not any discernible fluid in the disc space. Central canal is widely patent. Foraminal narrowing bilaterally could affect the L5 nerve roots. IMPRESSION: Progressive changes of edema and enhancement affecting the inferior endplate L5 and superior endplate S1 since the study of 08/23/2016. This somewhat rapid change is more consistent with indolent infection rather than degenerative change. Chronic pars defects at L5 with 5 mm of anterolisthesis. Foraminal narrowing bilaterally right more than left. Electronically Signed   By: Nelson Chimes M.D.   On: 09/12/2016 13:22   Mr Lumbar Spine W Wo Contrast  Result Date: 08/23/2016 CLINICAL DATA:  61 y/o M; MSSA bacteremia with concern for lumbar spine infection. EXAM: MRI LUMBAR SPINE WITHOUT AND WITH CONTRAST TECHNIQUE: Multiplanar and multiecho pulse sequences of the lumbar spine were obtained without and with intravenous contrast. CONTRAST:  17mL MULTIHANCE GADOBENATE DIMEGLUMINE 529 MG/ML IV SOLN COMPARISON:  None. FINDINGS: Segmentation:  Standard. Alignment: Grade 1 L5-S1 anterolisthesis and chronic L5  pars defects. Vertebrae: Mild edema and enhancement of the L5-S1 disc space and endplates. Small bilateral L4-5 facet effusions without significant edema or enhancement in the facets. No evidence for acute fracture. Conus medullaris: Extends to the L1 level and appears normal. No epidural fluid collection or  enhancement of the cauda equina. Paraspinal and other soft tissues: Negative. Disc levels: L1-2: Small disc bulge and mild facet hypertrophy with mild foraminal narrowing. No significant canal stenosis. L2-3: Small disc bulge and mild facet hypertrophy with mild foraminal narrowing. No significant canal stenosis. L3-4: Small disc bulge and mild facet hypertrophy with mild bilateral foraminal narrowing. No significant canal stenosis. Small right facet effusion. L4-5: Small disc bulge and moderate left-greater-than-right facet hypertrophy with mild left-greater-than-right foraminal narrowing. No significant canal stenosis. Small bilateral facet effusions. L5-S1: Anterolisthesis with uncovered disc bulge and moderate bilateral facet hypertrophy greater on the right. Mild left and moderate right foraminal narrowing. There is probable impingement of the right L5 nerve root in the right neural foramen (series 7, image 3). IMPRESSION: 1. Mild edema and enhancement at the L5-S1 disc space and endplates is likely degenerative related to chronic L5 pars defects and grade 1 anterolisthesis. Early discitis osteomyelitis could have this appearance but is considered less likely. 2. Right C3-4 and bilateral C4-5 facet effusions, likely degenerative. 3. Lumbar spondylosis with multilevel mild foraminal narrowing. 4. At the right L5-S1 neural foramen there is moderate narrowing and probable impingement of the exiting L5 nerve root. Electronically Signed   By: Kristine Garbe M.D.   On: 08/23/2016 19:01   US Abdomen Limited Ruq  Result Date: 08/18/2016 CLINICAL DATA:  Elevated liver function tests. EXAM: US ABDOMEN  LIMITED - RIGHT UPPER QUADRANT COMPARISON:  None. FINDINGS: Gallbladder: No gallstones or wall thickening visualized. No sonographic Murphy sign noted by sonographer. Common bile duct: Diameter: 0.2 cm Liver: No focal lesion or intrahepatic biliary ductal dilatation. Diffusely increased echogenicity is seen. IMPRESSION: Fatty infiltration of the liver. The examination is otherwise negative. Electronically Signed   By: Inge Rise M.D.   On: 08/18/2016 15:58    Assessment/Plan  \. #1 history of M SSA sepsis-endocarditis-and likely discitis-he's completed a course of gentamicin continues on a prolonged course of Ancef-he will have home health involvement for administration of the IV Ancef at home.  -Clinically he appears to be stable.  His back pain appears to gradually be getting somewhat better.  In regards to pain management he has been getting oxycodone controlled release as well as OxyIR for breakthrough pain-per discussion with Dr. Dellia Nims will discharge him on the OxyIR and discontinue the OxyContin controlled release on discharge-will need follow-up by primary care Dr. expediently to manage his pain  #2 low back pain secondary to L5-S1 discitis-again this has really been his major complaint during his stay here apparently this has improved somewhat will need followed by infectious disease.  #3 lower extremity edema this is somewhat persistent he has seen his cardiologist recommendation to increase his Lasix up to 40 mg a day-lab work including BNP during his stay here at been unremarkable he will have follow-up by cardiology and has an echo scheduled for mid May. Since his Lasix has been increased we'll increase his potassium slightly up to 20 mEq a day-update BMP by [facility] on Friday, March  16--notify provider of results--  #4 history of artificial heart valve he continues on Coumadin this has been therapeutic INR today is 2.97 this will need any close monitoring by home health will  have this rechecked on Friday by home health primary care provider notified of results for management  #5 hypertension-this is been relatively stable during his stay here continues on senna Prill again he is followed by cardiology as well.  Patient will be going home with his wife  who is very supportive he will need PT and OT for further strengthening as well as nursing for.  I  Patient is being discharged with the following home health services:  PT OT and home health again for strengthening as well as help with administration of IV antibiotics  Patient is being discharged with the following durable medical equipment:  He will need a rolling walker.    Patient has been advised to f/u with their PCP in 1-2 weeks to bring them up to date on their rehab stay.  Social services at facility was responsible for arranging this appointment.  Pt was provided with a 30 day supply of prescriptions for medications and refills must be obtained from their PCP.  For controlled substances, a more limited supply may be provided adequate until PCP appointment only.  Future labs/tests needed:  He does have serial labs done by infectious disease twice a week including a CBC and metabolic panel-he will need a PT/INR checked on Friday, March 16 I--primary care provider notified of results for follow-up.  [id number] note greater than 30 minutes spent on this discharge summary-greater than 50% of time spent coordinating plan of care for numerous diagnoses

## 2016-09-15 ENCOUNTER — Other Ambulatory Visit (HOSPITAL_COMMUNITY)
Admission: RE | Admit: 2016-09-15 | Discharge: 2016-09-15 | Disposition: A | Discharge status: Home / Self Care | Payer: 59 | Source: Skilled Nursing Facility | Attending: Internal Medicine | Admitting: Internal Medicine

## 2016-09-15 DIAGNOSIS — M549 Dorsalgia, unspecified: Secondary | ICD-10-CM | POA: Diagnosis not present

## 2016-09-15 DIAGNOSIS — R7881 Bacteremia: Secondary | ICD-10-CM | POA: Diagnosis present

## 2016-09-15 DIAGNOSIS — M4646 Discitis, unspecified, lumbar region: Secondary | ICD-10-CM | POA: Diagnosis not present

## 2016-09-15 DIAGNOSIS — A4102 Sepsis due to Methicillin resistant Staphylococcus aureus: Secondary | ICD-10-CM | POA: Diagnosis not present

## 2016-09-15 DIAGNOSIS — Z452 Encounter for adjustment and management of vascular access device: Secondary | ICD-10-CM | POA: Diagnosis not present

## 2016-09-15 LAB — CBC WITH DIFFERENTIAL/PLATELET
BASOS ABS: 0 10*3/uL (ref 0.0–0.1)
Basophils Relative: 0 %
EOS PCT: 2 %
Eosinophils Absolute: 0.1 10*3/uL (ref 0.0–0.7)
HCT: 30.9 % — ABNORMAL LOW (ref 39.0–52.0)
Hemoglobin: 10.1 g/dL — ABNORMAL LOW (ref 13.0–17.0)
LYMPHS ABS: 1.4 10*3/uL (ref 0.7–4.0)
Lymphocytes Relative: 18 %
MCH: 28.5 pg (ref 26.0–34.0)
MCHC: 32.7 g/dL (ref 30.0–36.0)
MCV: 87 fL (ref 78.0–100.0)
MONO ABS: 0.4 10*3/uL (ref 0.1–1.0)
Monocytes Relative: 5 %
Neutro Abs: 5.7 10*3/uL (ref 1.7–7.7)
Neutrophils Relative %: 75 %
PLATELETS: 314 10*3/uL (ref 150–400)
RBC: 3.55 MIL/uL — AB (ref 4.22–5.81)
RDW: 13.6 % (ref 11.5–15.5)
WBC: 7.6 10*3/uL (ref 4.0–10.5)

## 2016-09-15 LAB — BASIC METABOLIC PANEL
Anion gap: 7 (ref 5–15)
BUN: 22 mg/dL — AB (ref 6–20)
CO2: 28 mmol/L (ref 22–32)
CREATININE: 1.21 mg/dL (ref 0.61–1.24)
Calcium: 9 mg/dL (ref 8.9–10.3)
Chloride: 101 mmol/L (ref 101–111)
GFR calc Af Amer: >60 mL/min (ref >60)
GLUCOSE: 122 mg/dL — AB (ref 65–99)
Potassium: 4.2 mmol/L (ref 3.5–5.1)
SODIUM: 136 mmol/L (ref 135–145)

## 2016-09-19 ENCOUNTER — Encounter: Payer: Self-pay | Admitting: Internal Medicine

## 2016-09-27 ENCOUNTER — Encounter: Payer: Self-pay | Admitting: Internal Medicine

## 2016-09-27 ENCOUNTER — Ambulatory Visit (INDEPENDENT_AMBULATORY_CARE_PROVIDER_SITE_OTHER): Payer: 59 | Admitting: Internal Medicine

## 2016-09-27 DIAGNOSIS — I38 Endocarditis, valve unspecified: Principal | ICD-10-CM

## 2016-09-27 DIAGNOSIS — T826XXD Infection and inflammatory reaction due to cardiac valve prosthesis, subsequent encounter: Secondary | ICD-10-CM | POA: Diagnosis not present

## 2016-09-27 NOTE — Assessment & Plan Note (Signed)
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 He is improving slowly. I talked to him and his wife at length about treatment options including stopping all antibiotics now and removing the PICC, converting to oral cephalexin or continuing IV cefazolin. He elected the final option. I will see him back in 2 weeks. He will need a follow-up echocardiogram in the near future.  He is having some hematochezia and is anticoagulated. I suggested that he contact his primary care provider if this continues.

## 2016-09-27 NOTE — Progress Notes (Signed)
[facility]  Patient Active Problem List   Diagnosis Date Noted  . Prosthetic valve endocarditis ([place]) 08/25/2016    Priority: High  . Lumbar discitis 08/25/2016    Priority: High  . Staphylococcus aureus bacteremia with sepsis ([place]) 08/19/2016    Priority: High  . S/P AVR (aortic valve replacement) 06/19/2014    Priority: Medium  . Bilateral lower extremity edema 09/13/2016  . Anticoagulated 09/12/2016  . Unsteady gait   . Fever   . Thrombocytopenia ([place]) 08/19/2016  . HTN (hypertension) 08/19/2016  . Hypokalemia 08/18/2016  . Elevated LFTs 08/18/2016  . Asthma, chronic, moderate persistent, uncomplicated 32/20/2542  . Normocytic anemia 08/18/2016  . OSA (obstructive sleep apnea) 05/21/2014  . Varicose veins of leg with complications 70/62/3762    Patient's Medications  New Prescriptions   No medications on file  Previous Medications   ACETAMINOPHEN (TYLENOL) 325 MG TABLET    Take 2 tablets (650 mg total) by mouth every 6 (six) hours as needed for mild pain, fever or headache (or Fever >/= 101).   ALBUTEROL (PROVENTIL HFA;VENTOLIN HFA) 108 (90 BASE) MCG/ACT INHALER    Inhale 2 puffs into the lungs every 6 (six) hours as needed for wheezing or shortness of breath.   BUDESONIDE-FORMOTEROL (SYMBICORT) 160-4.5 MCG/ACT INHALER    Inhale 2 puffs into the lungs 2 times daily   CEFAZOLIN (ANCEF) 2-4 GM/100ML-% IVPB    Inject 100 mLs (2 g total) into the vein every 8 (eight) hours.   DEXTROMETHORPHAN-GUAIFENESIN (ROBITUSSIN COUGH+CHEST CONG DM) 5-100 MG/5ML LIQD    Give 10 ml by mouth every 4 hours as needed for cough/congestion   FLUTICASONE (FLONASE) 50 MCG/ACT NASAL SPRAY    Place 1 spray into both nostrils as needed for allergies or rhinitis (as directed for allergy symptoms).    FUROSEMIDE (LASIX) 40 MG TABLET    Take 40 mg by mouth daily.   LISINOPRIL (PRINIVIL,ZESTRIL) 20 MG TABLET    Take 20 mg by mouth daily.   LORATADINE (CLARITIN) 10  MG TABLET    Take 10 mg by mouth daily as needed for allergies.    MENTHOL, TOPICAL ANALGESIC, (BIOFREEZE) 4 % GEL    Apply a small amount to the spine prn   METHOCARBAMOL (ROBAXIN) 500 MG TABLET    Take 1 tablet (500 mg total) by mouth every 8 (eight) hours as needed for muscle spasms.   MULTIPLE VITAMINS-MINERALS (CENTRUM SILVER ADULT 50+ PO)    Take 1 tablet by mouth daily with breakfast.   OMEPRAZOLE (PRILOSEC) 20 MG CAPSULE    Take 1 capsule by mouth daily.   OXYCODONE (OXYCONTIN) 10 MG 12 HR TABLET    Take one tablet by mouth every 12 hours for pain. Hold for sedation or respiratory depression. Do not crush   OXYCODONE HCL 10 MG TABS    Take 10 mg by mouth every 6 (six) hours as needed.   POTASSIUM CHLORIDE (K-DUR) 10 MEQ TABLET    Take 10 mEq by mouth daily.   POTASSIUM CHLORIDE SA (K-DUR,KLOR-CON) 20 MEQ TABLET    Take 20 mEq by mouth daily.   PREVIDENT 5000 BOOSTER PLUS 1.1 % PSTE    USE A THIN RIBBON OF PASTE ON TOOTHBRUSH NIGHTLY BEFORE BED. DO NOT RINSE   SENNA (SENOKOT) 8.6 MG TABS TABLET    Take 1 tablet by mouth 2 (two) times daily. Hold for diarrhea   WARFARIN (COUMADIN) 4 MG TABLET  Give 4 mg by mouth along with 5 mg to equal 9 mg once a evening   WARFARIN (COUMADIN) 5 MG TABLET    Give 5 mg by mouth along with 4 mg to equal 9 mg once a evening   ZOLPIDEM (AMBIEN) 10 MG TABLET    Take 10 mg by mouth at bedtime.   Modified Medications   No medications on file  Discontinued Medications   No medications on file    Subjective: Jeffery Whitehead is in with his wife, Almyra Free, for hospital follow-up of his MSSA bacteremia complicated by prosthetic valve endocarditis involving his [employer]. Jude's aortic valve replacement and discitis at the L5-S1 level. He completed 2 weeks of IV gentamicin on 09/07/2016 without any side effects. He is now completed 41 days of IV cefazolin. He has had no problem tolerating his PICC or antibiotics. His pain is steadily improving. He is now back home and doing physical  therapy several times each week. He still uses a cane to ambulate but has been able to resume more of his usual activities and is getting by with less oxycodone. He estimates that he takes 1-2 10 mg oxycodone tablets daily over the past week. He is still quite fatigued but this is improving. He d he has not had any pain with bowel movements. oes notice some bright red blood in the toilet after a bowel movement yesterday and again this morning. He has not had any problem with constipation or diarrhea.  Review of Systems: Review of Systems  Constitutional: Positive for malaise/fatigue and weight loss. Negative for chills, diaphoresis and fever.  HENT: Negative for sore throat.   Respiratory: Negative for cough, sputum production and shortness of breath.   Cardiovascular: Negative for chest pain.  Gastrointestinal: Positive for blood in stool. Negative for abdominal pain, diarrhea, heartburn, nausea and vomiting.  Genitourinary: Negative for dysuria and frequency.  Musculoskeletal: Positive for back pain. Negative for joint pain and myalgias.  Skin: Negative for rash.  Neurological: Negative for dizziness and headaches.    Past Medical History:  Diagnosis Date  . Cold   . Decreased anal sphincter tone   . Heart valve replaced    1983   st jude  . Insomnia   . Shortness of breath dyspnea   . Sleep apnea    cpap  . Varicose vein of leg   . Varicose veins     Social History  Substance Use Topics  . Smoking status: Never Smoker  . Smokeless tobacco: Never Used  . Alcohol use 0.0 oz/week     Comment: wine at night    Family History  Problem Relation Age of Onset  . Heart disease Father     No Known Allergies  Objective: Vitals:   09/27/16 1140  BP: 115/76  Pulse: 69  Temp: 97.7 F (36.5 C)  TempSrc: Oral  Weight: 238 lb (108 kg)  Height: 5\' 11"  (1.803 m)   Body mass index is 33.19 kg/m.  Physical Exam  Constitutional: He is oriented to person, place, and time.  He  appears more comfortable than when I last saw him in the hospital. He has lots of questions and written notes.  Cardiovascular: Normal rate and regular rhythm.   Murmur heard. No change in 2/6 systolic murmur.  Pulmonary/Chest: Effort normal and breath sounds normal. He has no wheezes. He has no rales.  Abdominal: Soft. He exhibits no distension.  Musculoskeletal: Normal range of motion. He exhibits no edema or tenderness.  Neurological:  He is alert and oriented to person, place, and time.  He walks slowly but normally with the aid of his cane.  Skin: No rash noted.  Right arm PICC site reveals only slight erythema at the exit site.  Psychiatric: Mood and affect normal.    Lab Results Hemoglobin 09/19/2016: 8.7 Creatinine 09/19/2016: 1.09 Sedimentation rate 09/19/2016: 42 C-reactive protein 09/19/2016: 15.8   Problem List Items Addressed This Visit      High   Prosthetic valve endocarditis ([place])    He is improving on therapy for prosthetic valve endocarditis and vertebral infection. He is improving slowly. I talked to him and his wife at length about treatment options including stopping all antibiotics now and removing the PICC, converting to oral cephalexin or continuing IV cefazolin. He elected the final option. I will see him back in 2 weeks. He will need a follow-up echocardiogram in the near future.  He is having some hematochezia and is anticoagulated. I suggested that he contact his primary care provider if this continues.          Michel Bickers, MD [facility] [phone] pager   [phone] cell 09/27/2016, 12:34 PM

## 2016-10-04 ENCOUNTER — Telehealth: Payer: Self-pay | Admitting: Interventional Cardiology

## 2016-10-04 NOTE — Telephone Encounter (Signed)
OK. Let us know if there are any symptoms such as syncope or chest pain or recurrent fevers.

## 2016-10-04 NOTE — Telephone Encounter (Signed)
New message      Nurse is calling to report pt's heart rate is in the upper 40 range----46-48.  His bp is 130/84.  Patient complains of dizziness after bending over or going from a sitting to a standing position.  Please advise

## 2016-10-04 NOTE — Telephone Encounter (Signed)
[facility] is calling and states that at her visit this morning the patient's HR was 46-48. She states that the patient's HR is usually in the 37s. She states that his pulse is regular. She states that his BP was 130/84.She states that he does become dizzy after bending over and when he stands up. She states that the patient is not having any chest pain, SOB, or any other symptoms. She states that the patient is drinking plenty of fluids. Advised that patient should slowly change positions to minimize becoming dizzy. She states that she wanted to let us know that his HR was lower than usual. Will forward to Dr. Irish Lack for review and recommendations.

## 2016-10-05 NOTE — Telephone Encounter (Signed)
Attempted to call patient and there was no answer and no voicemail set up. Will attempt to call at a later time.

## 2016-10-05 NOTE — Telephone Encounter (Signed)
Patient made aware of Dr. Varanasi's recommendations. Patient verbalized understanding. 

## 2016-10-10 ENCOUNTER — Telehealth: Payer: Self-pay | Admitting: Interventional Cardiology

## 2016-10-10 NOTE — Telephone Encounter (Signed)
HR can be underestimated if he is having premature beats.  He is not on any other meds that would slow his HR down at this time.  COntinue to watch.  If these episodes are frequent, we can place an event monitor to look at his HR when he is at home.

## 2016-10-10 NOTE — Telephone Encounter (Signed)
Pt states that his HR has been fluctuating between 40-70 since last week.  His normal is usually upper 60's-70's.  BP today 122/78, 70.  States BP is normally 120-130/upper 60's-80.  States when HR in 40's pt does become dizzy, especially if he bends over.  Denies syncope.  Last night HR was 48.  Currently on ABT for staph.  Advised I would send message to Dr. Irish Lack for review and advisement.

## 2016-10-10 NOTE — Telephone Encounter (Signed)
New Message   Pt states his pulse changes day to day and wants to know if he would be eligible to get a heart monitor and if so what are the steps he needs to take in order to get one.

## 2016-10-11 ENCOUNTER — Encounter: Payer: Self-pay | Admitting: Internal Medicine

## 2016-10-11 ENCOUNTER — Telehealth: Payer: Self-pay | Admitting: Pharmacist

## 2016-10-11 ENCOUNTER — Ambulatory Visit (INDEPENDENT_AMBULATORY_CARE_PROVIDER_SITE_OTHER): Payer: 59 | Admitting: Internal Medicine

## 2016-10-11 ENCOUNTER — Encounter: Payer: Self-pay | Admitting: Interventional Cardiology

## 2016-10-11 DIAGNOSIS — A4101 Sepsis due to Methicillin susceptible Staphylococcus aureus: Secondary | ICD-10-CM

## 2016-10-11 NOTE — Telephone Encounter (Signed)
Patient made aware of Dr. Varanasi's recommendations. Patient verbalized understanding. 

## 2016-10-11 NOTE — Telephone Encounter (Signed)
Gave verbal order to [facility] to stop IV abx today and pull patient's PICC.

## 2016-10-11 NOTE — Progress Notes (Signed)
[facility] for Infectious Disease  Patient Active Problem List   Diagnosis Date Noted  . Prosthetic valve endocarditis ([place]) 08/25/2016    Priority: High  . Lumbar discitis 08/25/2016    Priority: High  . Staphylococcus aureus bacteremia with sepsis ([place]) 08/19/2016    Priority: High  . S/P AVR (aortic valve replacement) 06/19/2014    Priority: Medium  . Bilateral lower extremity edema 09/13/2016  . Anticoagulated 09/12/2016  . Unsteady gait   . Fever   . Thrombocytopenia ([place]) 08/19/2016  . HTN (hypertension) 08/19/2016  . Hypokalemia 08/18/2016  . Elevated LFTs 08/18/2016  . Asthma, chronic, moderate persistent, uncomplicated 56/38/9373  . Normocytic anemia 08/18/2016  . OSA (obstructive sleep apnea) 05/21/2014  . Varicose veins of leg with complications 42/87/6811    Patient's Medications  New Prescriptions   No medications on file  Previous Medications   ACETAMINOPHEN (TYLENOL) 325 MG TABLET    Take 2 tablets (650 mg total) by mouth every 6 (six) hours as needed for mild pain, fever or headache (or Fever >/= 101).   ALBUTEROL (PROVENTIL HFA;VENTOLIN HFA) 108 (90 BASE) MCG/ACT INHALER    Inhale 2 puffs into the lungs every 6 (six) hours as needed for wheezing or shortness of breath.   BUDESONIDE-FORMOTEROL (SYMBICORT) 160-4.5 MCG/ACT INHALER    Inhale 2 puffs into the lungs 2 times daily   DEXTROMETHORPHAN-GUAIFENESIN (ROBITUSSIN COUGH+CHEST CONG DM) 5-100 MG/5ML LIQD    Give 10 ml by mouth every 4 hours as needed for cough/congestion   FLUTICASONE (FLONASE) 50 MCG/ACT NASAL SPRAY    Place 1 spray into both nostrils as needed for allergies or rhinitis (as directed for allergy symptoms).    FUROSEMIDE (LASIX) 40 MG TABLET    Take 40 mg by mouth daily.   LISINOPRIL (PRINIVIL,ZESTRIL) 20 MG TABLET    Take 20 mg by mouth daily.   LORATADINE (CLARITIN) 10 MG TABLET    Take 10 mg by mouth daily as needed for allergies.    MENTHOL, TOPICAL ANALGESIC, (BIOFREEZE) 4  % GEL    Apply a small amount to the spine prn   METHOCARBAMOL (ROBAXIN) 500 MG TABLET    Take 1 tablet (500 mg total) by mouth every 8 (eight) hours as needed for muscle spasms.   MULTIPLE VITAMINS-MINERALS (CENTRUM SILVER ADULT 50+ PO)    Take 1 tablet by mouth daily with breakfast.   OMEPRAZOLE (PRILOSEC) 20 MG CAPSULE    Take 1 capsule by mouth daily.   OXYCODONE (OXYCONTIN) 10 MG 12 HR TABLET    Take one tablet by mouth every 12 hours for pain. Hold for sedation or respiratory depression. Do not crush   OXYCODONE HCL 10 MG TABS    Take 10 mg by mouth every 6 (six) hours as needed.   POTASSIUM CHLORIDE (K-DUR) 10 MEQ TABLET    Take 10 mEq by mouth daily.   POTASSIUM CHLORIDE SA (K-DUR,KLOR-CON) 20 MEQ TABLET    Take 20 mEq by mouth daily.   PREVIDENT 5000 BOOSTER PLUS 1.1 % PSTE    USE A THIN RIBBON OF PASTE ON TOOTHBRUSH NIGHTLY BEFORE BED. DO NOT RINSE   SENNA (SENOKOT) 8.6 MG TABS TABLET    Take 1 tablet by mouth 2 (two) times daily. Hold for diarrhea   WARFARIN (COUMADIN) 1 MG TABLET    TAKE 1/2 TABLET EVERY WEDNESDAY ONLY   WARFARIN (COUMADIN) 4 MG TABLET    Give 4 mg by mouth along  with 5 mg to equal 9 mg once a evening   WARFARIN (COUMADIN) 5 MG TABLET    Give 5 mg by mouth along with 4 mg to equal 9 mg once a evening   WARFARIN (COUMADIN) 7.5 MG TABLET    Take 7.5 mg by mouth.   WARFARIN (COUMADIN) 7.5 MG TABLET    Take 7.5 mg by mouth daily.   ZOLPIDEM (AMBIEN) 10 MG TABLET    Take 10 mg by mouth at bedtime.   Modified Medications   No medications on file  Discontinued Medications   No medications on file    Subjective: Jeffery Whitehead is in with his wife for his routine follow-up visit. He is now completed 8 weeks of IV cefazolin for his MSSA bacteremia complicated by possible prosthetic aortic valve endocarditis and lumbar discitis. He has not had any problems tolerating his PICC or cefazolin. His back pain continues to improve slowly. He has been resuming more of his usual  activities. He states that his pain is currently only 3 out of 10. He has not required any oxycodone today. Recently he has been able to get by with only 5 mg of oxycodone, usually twice daily. He has had some recent periods of bradycardia that have been largely asymptomatic. His heart rate has been in the mid to high 40s.  Review of Systems: Review of Systems  Constitutional: Negative for chills, diaphoresis and fever.  Cardiovascular: Negative for chest pain.  Gastrointestinal: Negative for abdominal pain, diarrhea, nausea and vomiting.  Musculoskeletal: Positive for back pain.  Neurological: Negative for dizziness and headaches.    Past Medical History:  Diagnosis Date  . Cold   . Decreased anal sphincter tone   . Heart valve replaced    1983   [facility] jude  . Insomnia   . Shortness of breath dyspnea   . Sleep apnea    cpap  . Varicose vein of leg   . Varicose veins     Social History  Substance Use Topics  . Smoking status: Never Smoker  . Smokeless tobacco: Never Used  . Alcohol use 0.0 oz/week     Comment: wine at night    Family History  Problem Relation Age of Onset  . Heart disease Father     No Known Allergies  Objective: Vitals:   10/11/16 1139  Weight: 238 lb (108 kg)  Height: 6' (1.829 m)   Body mass index is 32.28 kg/m.  Physical Exam  Constitutional: He is oriented to person, place, and time.  He is in good spirits.  Cardiovascular: Normal rate.   Murmur heard. He has frequent premature beats. No change in his 2/6 systolic murmur.  Pulmonary/Chest: Effort normal and breath sounds normal.  Abdominal: Soft. There is no tenderness.  Neurological: He is alert and oriented to person, place, and time.  Skin: No rash noted.  Right arm PICC site looks normal.  Psychiatric: Mood and affect normal.    Lab Results Sedimentation rate 10/03/2016: 46 C-reactive protein 10/03/2016: 5.6   Problem List Items Addressed This Visit      High    Staphylococcus aureus bacteremia with sepsis ([place])    I am hopeful that his MSSA bacteremia, endocarditis and discitis have been cured. I will have him stop cefazolin and have a PICC removed now. He will follow-up with me in 6 weeks. He knows to call me if he has any signs or symptoms suggesting a relapse.  Jeffery Bickers, MD [facility] for [facility] [place] [facility] [phone] pager   [phone] cell 10/11/2016, 12:00 PM

## 2016-10-11 NOTE — Assessment & Plan Note (Signed)
I am hopeful that his MSSA bacteremia, endocarditis and discitis have been cured. I will have him stop cefazolin and have a PICC removed now. He will follow-up with me in 6 weeks. He knows to call me if he has any signs or symptoms suggesting a relapse.

## 2016-10-12 ENCOUNTER — Other Ambulatory Visit: Payer: Self-pay

## 2016-10-12 MED ORDER — POTASSIUM CHLORIDE CRYS ER 20 MEQ PO TBCR: 20.0000 meq | EXTENDED_RELEASE_TABLET | Freq: Every day | ORAL | 11 refills | Status: DC

## 2016-10-18 ENCOUNTER — Telehealth: Payer: Self-pay | Admitting: *Deleted

## 2016-10-18 NOTE — Telephone Encounter (Signed)
Patient called to ask if it was to soon for him to have a dental procedure. He advised he usually has to premedicate with Amoxicillin, due to a heart valve issue. He advised it will involve replacing caps and crowns. Advised will have to ask his doctor and give him a call back.

## 2016-10-18 NOTE — Telephone Encounter (Signed)
Please let him know it is okay for him to have dental work at any time.

## 2016-10-19 ENCOUNTER — Encounter: Payer: Self-pay | Admitting: Internal Medicine

## 2016-10-20 ENCOUNTER — Encounter: Payer: Self-pay | Admitting: *Deleted

## 2016-10-20 NOTE — Telephone Encounter (Signed)
Patient was informed through My Chart.

## 2016-10-28 ENCOUNTER — Encounter: Payer: Self-pay | Admitting: Internal Medicine

## 2016-10-28 ENCOUNTER — Other Ambulatory Visit: Payer: Self-pay | Admitting: Internal Medicine

## 2016-10-28 DIAGNOSIS — B351 Tinea unguium: Secondary | ICD-10-CM | POA: Insufficient documentation

## 2016-10-28 MED ORDER — TERBINAFINE HCL 250 MG PO TABS: 250.0000 mg | ORAL_TABLET | Freq: Every day | ORAL | Status: DC

## 2016-10-28 NOTE — Telephone Encounter (Signed)
Your liver enzymes were normal when they were last checked on 09/07/2016. Terbinafine can interact with Coumadin so I would discuss this carefully with your provider who is monitoring your Coumadin therapy.

## 2016-10-31 ENCOUNTER — Ambulatory Visit (INDEPENDENT_AMBULATORY_CARE_PROVIDER_SITE_OTHER): Payer: 59 | Admitting: Acute Care

## 2016-10-31 ENCOUNTER — Encounter: Payer: Self-pay | Admitting: Acute Care

## 2016-10-31 DIAGNOSIS — G4733 Obstructive sleep apnea (adult) (pediatric): Secondary | ICD-10-CM | POA: Diagnosis not present

## 2016-10-31 NOTE — Progress Notes (Signed)
History of Present Illness Gregg Winchell is a 61 y.o. male with OSA on CPAP . He is followed by Dr. Lamonte Sakai.   10/31/2016 CPAP follow-up for compliance Patient presents today for CPAP compliance check. Patient states he is doing well on CPAP. He has had recent hospitalization 08/17/2016 through 08/31/2016 for sepsis and endocarditis. He was rehabbed at [facility] 08/31/2016 through 09/15/2016. Patient states she is currently wearing his CPAP 5-6 nights a week for at least 5-6 hours. He states his daytime sleepiness is better. He states any fatigue he has currently is due to deconditioning as a result of his recent hospitalization. He is currently in physical therapy 3 times weekly. He denies any issues with mask leak. He denies fever chest pain orthopnea her hemoptysis.  Test Results: Download 05/04/2016 through 10/30/2016 AutoSet 5-15 cm of water Usage 159 of 180 days ( 88%) Greater than 4 hours equals 128 days or 71%  less than 4 hours equals 31 days or 17%  average uses is 5 hours and 11 minutes Median pressure is 6.8 cm of water  median leaks is 1.5 L/m  AHI is 1.8  CBC Latest Ref Rng & Units 09/15/2016 09/12/2016 09/07/2016  WBC 4.0 - 10.5 K/uL 7.6 6.4 7.6  Hemoglobin 13.0 - 17.0 g/dL 10.1(L) 9.7(L) 9.9(L)  Hematocrit 39.0 - 52.0 % 30.9(L) 29.9(L) 30.2(L)  Platelets 150 - 400 K/uL 314 312 370    BMP Latest Ref Rng & Units 09/15/2016 09/12/2016 09/08/2016  Glucose 65 - 99 mg/dL 122(H) 105(H) 121(H)  BUN 6 - 20 mg/dL 22(H) 25(H) 27(H)  Creatinine 0.61 - 1.24 mg/dL 1.21 1.22 1.08  Sodium 135 - 145 mmol/L 136 136 135  Potassium 3.5 - 5.1 mmol/L 4.2 4.1 4.1  Chloride 101 - 111 mmol/L 101 101 100(L)  CO2 22 - 32 mmol/L 28 27 26   Calcium 8.9 - 10.3 mg/dL 9.0 9.2 9.0    BNP    Component Value Date/Time   BNP 78.0 09/07/2016 0800    ProBNP No results found for: PROBNP  PFT    Component Value Date/Time   FEV1PRE 2.71 09/08/2015 0909   FEV1POST 3.33 09/08/2015 0909   FVCPRE 5.01  09/08/2015 0909   FVCPOST 5.49 09/08/2015 0909   DLCOUNC 46.52 09/08/2015 0909   PREFEV1FVCRT 54 09/08/2015 0909   PSTFEV1FVCRT 61 09/08/2015 0909    No results found.   Past medical hx Past Medical History:  Diagnosis Date  . Cold   . Decreased anal sphincter tone   . Heart valve replaced    [facility]  . Insomnia   . Shortness of breath dyspnea   . Sleep apnea    cpap  . Varicose vein of leg   . Varicose veins      Social History  Substance Use Topics  . Smoking status: Never Smoker  . Smokeless tobacco: Never Used  . Alcohol use 0.0 oz/week     Comment: wine at night    Tobacco Cessation:Patient is a never smoker   Past surgical hx, Family hx, Social hx all reviewed.  Current Outpatient Prescriptions on File Prior to Visit  Medication Sig  . acetaminophen (TYLENOL) 325 MG tablet Take 2 tablets (650 mg total) by mouth every 6 (six) hours as needed for mild pain, fever or headache (or Fever >/= 101).  . budesonide-formoterol (SYMBICORT) 160-4.5 MCG/ACT inhaler Inhale 2 puffs into the lungs 2 times daily  . Dextromethorphan-Guaifenesin (ROBITUSSIN COUGH+CHEST CONG DM) 5-100 MG/5ML LIQD Give  10 ml by mouth every 4 hours as needed for cough/congestion  . furosemide (LASIX) 40 MG tablet Take 40 mg by mouth daily.  Marland Kitchen lisinopril (PRINIVIL,ZESTRIL) 20 MG tablet Take 20 mg by mouth daily.  Marland Kitchen loratadine (CLARITIN) 10 MG tablet Take 10 mg by mouth daily as needed for allergies.   . Menthol, Topical Analgesic, (BIOFREEZE) 4 % GEL Apply a small amount to the spine prn  . methocarbamol (ROBAXIN) 500 MG tablet Take 1 tablet (500 mg total) by mouth every 8 (eight) hours as needed for muscle spasms.  . Multiple Vitamins-Minerals (CENTRUM SILVER ADULT 50+ PO) Take 1 tablet by mouth daily with breakfast.  . omeprazole (PRILOSEC) 20 MG capsule Take 1 capsule by mouth daily.  . Oxycodone HCl 10 MG TABS Take 10 mg by mouth every 6 (six) hours as needed.  . potassium chloride SA  (K-DUR,KLOR-CON) 20 MEQ tablet Take 1 tablet (20 mEq total) by mouth daily.  Marland Kitchen PREVIDENT 5000 BOOSTER PLUS 1.1 % PSTE USE A THIN RIBBON OF PASTE ON TOOTHBRUSH NIGHTLY BEFORE BED. DO NOT RINSE  . senna (SENOKOT) 8.6 MG TABS tablet Take 1 tablet by mouth 2 (two) times daily. Hold for diarrhea  . terbinafine (LAMISIL) 250 MG tablet Take 1 tablet (250 mg total) by mouth daily.  Marland Kitchen warfarin (COUMADIN) 7.5 MG tablet Take 7.5 mg by mouth daily.  Marland Kitchen zolpidem (AMBIEN) 10 MG tablet Take 10 mg by mouth at bedtime.    No current facility-administered medications on file prior to visit.      No Known Allergies  Review Of Systems:  Constitutional:   No  weight loss, night sweats,  Fevers, chills, positive fatigue, or  lassitude.  HEENT:   No headaches,  Difficulty swallowing,  Tooth/dental problems, or  Sore throat,                No sneezing, itching, ear ache, nasal congestion, post nasal drip,   CV:  No chest pain,  Orthopnea, PND, swelling in lower extremities, anasarca, dizziness, palpitations, syncope.   GI  No heartburn, indigestion, abdominal pain, nausea, vomiting, diarrhea, change in bowel habits, loss of appetite, bloody stools.   Resp: No shortness of breath with exertion or at rest.  No excess mucus, no productive cough,  No non-productive cough,  No coughing up of blood.  No change in color of mucus.  No wheezing.  No chest wall deformity  Skin: no rash or lesions.  GU: no dysuria, change in color of urine, no urgency or frequency.  No flank pain, no hematuria   MS:  No joint pain or swelling.  No decreased range of motion.  No back pain.  Psych:  No change in mood or affect. No depression or anxiety.  No memory loss.   Vital Signs BP 120/70 (BP Location: Right Arm, Cuff Size: Normal)   Pulse 70   Ht 6' (1.829 m)   Wt 236 lb (107 kg)   SpO2 98%   BMI 32.01 kg/m    Physical Exam:  General- No distress,  A&Ox3, pleasant ENT: No sinus tenderness, TM clear, pale nasal mucosa,  no oral exudate,no post nasal drip, no LAN Cardiac: S1, S2, regular rate and rhythm, no murmur Chest: No wheeze/ rales/ dullness; no accessory muscle use, no nasal flaring, no sternal retractions Abd.: Soft Non-tender, obese Ext: No clubbing cyanosis, edema Neuro:  Deconditioned due to recent hospitalization Skin: No rashes, warm and dry Psych: normal mood and behavior   Assessment/Plan  OSA (obstructive sleep apnea) Good compliance with CPAP patient is obviously benefiting from therapy Plan Continue wearing your  CPAP at bedtime. You appear to be benefiting from the treatment Goal is to wear for at least 6 hours each night for maximal clinical benefit. Continue to work on weight loss, as the link between excess weight  and sleep apnea is well established.  Do not drive if sleepy. Follow up with NP of Dr. Lamonte Sakai in 3 months. Please contact office for sooner follow up if symptoms do not improve or worsen or seek emergency care      Magdalen Spatz, NP 10/31/2016  5:49 PM

## 2016-10-31 NOTE — Assessment & Plan Note (Signed)
Good compliance with CPAP patient is obviously benefiting from therapy Plan Continue wearing your  CPAP at bedtime. You appear to be benefiting from the treatment Goal is to wear for at least 6 hours each night for maximal clinical benefit. Continue to work on weight loss, as the link between excess weight  and sleep apnea is well established.  Do not drive if sleepy. Follow up with NP of Dr. Lamonte Sakai in 3 months. Please contact office for sooner follow up if symptoms do not improve or worsen or seek emergency care

## 2016-10-31 NOTE — Patient Instructions (Addendum)
It is good to see you today We will place an order with [facility] for a new Simcard.of to have the Sim Card cleared Please go by there office and have them help you reset the card. ( We have called them) We will call you and let you know what next steps will be needed to meet with compliance requirements. Continue wearing your  CPAP at bedtime. You appear to be benefiting from the treatment Goal is to wear for at least 6 hours each night for maximal clinical benefit. Continue to work on weight loss, as the link between excess weight  and sleep apnea is well established.  Do not drive if sleepy. Follow up with NP of Dr. Lamonte Sakai in 3 months. Please contact office for sooner follow up if symptoms do not improve or worsen or seek emergency care   [facility]  [address] [place], [place] [address]  Hours:  Open now   Add full hours  Phone: [phone]

## 2016-11-01 ENCOUNTER — Encounter: Payer: Self-pay | Admitting: Internal Medicine

## 2016-11-02 ENCOUNTER — Other Ambulatory Visit: Payer: Self-pay

## 2016-11-02 MED ORDER — POTASSIUM CHLORIDE CRYS ER 20 MEQ PO TBCR: 20.0000 meq | EXTENDED_RELEASE_TABLET | Freq: Every day | ORAL | 11 refills | Status: DC | PRN

## 2016-11-14 ENCOUNTER — Other Ambulatory Visit: Payer: Self-pay

## 2016-11-14 ENCOUNTER — Ambulatory Visit (HOSPITAL_COMMUNITY): Payer: 59 | Attending: Cardiology

## 2016-11-14 DIAGNOSIS — T826XXD Infection and inflammatory reaction due to cardiac valve prosthesis, subsequent encounter: Secondary | ICD-10-CM | POA: Diagnosis not present

## 2016-11-14 DIAGNOSIS — I083 Combined rheumatic disorders of mitral, aortic and tricuspid valves: Secondary | ICD-10-CM | POA: Diagnosis not present

## 2016-11-14 DIAGNOSIS — Z952 Presence of prosthetic heart valve: Secondary | ICD-10-CM | POA: Diagnosis not present

## 2016-11-14 DIAGNOSIS — I38 Endocarditis, valve unspecified: Secondary | ICD-10-CM

## 2016-11-16 ENCOUNTER — Telehealth: Payer: Self-pay | Admitting: Interventional Cardiology

## 2016-11-16 DIAGNOSIS — I38 Endocarditis, valve unspecified: Principal | ICD-10-CM

## 2016-11-16 DIAGNOSIS — T826XXD Infection and inflammatory reaction due to cardiac valve prosthesis, subsequent encounter: Secondary | ICD-10-CM

## 2016-11-16 DIAGNOSIS — Z952 Presence of prosthetic heart valve: Secondary | ICD-10-CM

## 2016-11-16 NOTE — Telephone Encounter (Signed)
-----   Message from [name], MD sent at 11/15/2016  5:18 PM EDT ----- LV function normal.  His valve now does have a moderate leak and thickening after the infection.  THis is something that we will have to follow with another ultrasound in one year.

## 2016-11-16 NOTE — Telephone Encounter (Signed)
Patient made aware of results. Patient verbalizes understanding. Repeat echocardiogram ordered for 1 year.

## 2016-11-16 NOTE — Telephone Encounter (Signed)
New message    Pt is returning your call about his test results

## 2016-11-22 DIAGNOSIS — J3489 Other specified disorders of nose and nasal sinuses: Secondary | ICD-10-CM | POA: Insufficient documentation

## 2016-11-24 ENCOUNTER — Encounter: Payer: Self-pay | Admitting: Internal Medicine

## 2016-11-24 ENCOUNTER — Ambulatory Visit (INDEPENDENT_AMBULATORY_CARE_PROVIDER_SITE_OTHER): Payer: 59 | Admitting: Internal Medicine

## 2016-11-24 DIAGNOSIS — I38 Endocarditis, valve unspecified: Secondary | ICD-10-CM

## 2016-11-24 DIAGNOSIS — T826XXD Infection and inflammatory reaction due to cardiac valve prosthesis, subsequent encounter: Secondary | ICD-10-CM | POA: Diagnosis not present

## 2016-11-24 DIAGNOSIS — F320 Major depressive disorder, single episode, mild: Secondary | ICD-10-CM | POA: Diagnosis not present

## 2016-11-24 DIAGNOSIS — M4646 Discitis, unspecified, lumbar region: Secondary | ICD-10-CM | POA: Diagnosis not present

## 2016-11-24 DIAGNOSIS — F32A Depression, unspecified: Secondary | ICD-10-CM | POA: Insufficient documentation

## 2016-11-24 DIAGNOSIS — F329 Major depressive disorder, single episode, unspecified: Secondary | ICD-10-CM | POA: Insufficient documentation

## 2016-11-24 NOTE — Progress Notes (Signed)
[facility] for Infectious [facility]  Patient Active Problem List   Diagnosis Date Noted  . Prosthetic valve endocarditis ([place]) 08/25/2016    Priority: High  . Lumbar discitis 08/25/2016    Priority: High  . Staphylococcus aureus bacteremia with sepsis ([place]) 08/19/2016    Priority: High  . S/P AVR (aortic valve replacement) 06/19/2014    Priority: Medium  . Depression 11/24/2016  . Onychomycosis 10/28/2016  . Bilateral lower extremity edema 09/13/2016  . Anticoagulated 09/12/2016  . Unsteady gait   . Thrombocytopenia ([place]) 08/19/2016  . HTN (hypertension) 08/19/2016  . Hypokalemia 08/18/2016  . Asthma, chronic, moderate persistent, uncomplicated 17/79/3903  . Normocytic anemia 08/18/2016  . OSA (obstructive sleep apnea) 05/21/2014  . Varicose veins of leg with complications 00/92/3300    Patient's Medications  New Prescriptions   No medications on file  Previous Medications   ACETAMINOPHEN (TYLENOL) 325 MG TABLET    Take 2 tablets (650 mg total) by mouth every 6 (six) hours as needed for mild pain, fever or headache (or Fever >/= 101).   BUDESONIDE-FORMOTEROL (SYMBICORT) 160-4.5 MCG/ACT INHALER    Inhale 2 puffs into the lungs 2 times daily   DEXTROMETHORPHAN-GUAIFENESIN (ROBITUSSIN COUGH+CHEST CONG DM) 5-100 MG/5ML LIQD    Give 10 ml by mouth every 4 hours as needed for cough/congestion   FUROSEMIDE (LASIX) 40 MG TABLET    Take 40 mg by mouth daily as needed for edema.   LISINOPRIL (PRINIVIL,ZESTRIL) 20 MG TABLET    Take 20 mg by mouth daily.   LORATADINE (CLARITIN) 10 MG TABLET    Take 10 mg by mouth daily as needed for allergies.    MENTHOL, TOPICAL ANALGESIC, (BIOFREEZE) 4 % GEL    Apply a small amount to the spine prn   METHOCARBAMOL (ROBAXIN) 500 MG TABLET    Take 1 tablet (500 mg total) by mouth every 8 (eight) hours as needed for muscle spasms.   MULTIPLE VITAMINS-MINERALS (CENTRUM SILVER ADULT 50+ PO)    Take 1 tablet by mouth daily with breakfast.   OMEPRAZOLE (PRILOSEC) 20 MG CAPSULE    Take 1 capsule by mouth daily.   POTASSIUM CHLORIDE SA (K-DUR,KLOR-CON) 20 MEQ TABLET    Take 1 tablet (20 mEq total) by mouth daily as needed. TAKE ONLY if taking furosemide (lasix)   PREVIDENT 5000 BOOSTER PLUS 1.1 % PSTE    USE A THIN RIBBON OF PASTE ON TOOTHBRUSH NIGHTLY BEFORE BED. DO NOT RINSE   SENNA (SENOKOT) 8.6 MG TABS TABLET    Take 1 tablet by mouth 2 (two) times daily. Hold for diarrhea   TERBINAFINE (LAMISIL) 250 MG TABLET    Take 1 tablet (250 mg total) by mouth daily.   WARFARIN (COUMADIN) 7.5 MG TABLET    Take 7.5 mg by mouth daily.   ZOLPIDEM (AMBIEN) 10 MG TABLET    Take 10 mg by mouth at bedtime.   Modified Medications   No medications on file  Discontinued Medications   OXYCODONE HCL 10 MG TABS    Take 10 mg by mouth every 6 (six) hours as needed.    Subjective: Shin is in for his routine follow-up visit. He completed 8 weeks of IV cefazolin on 10/11/2016 for his MSSA prosthetic aortic valve endocarditis and L5-S1 discitis. He has not had any fever, chills, sweats, nausea, vomiting, or diarrhea. He continues to have low back pain particularly after he has been laying down or sitting for an extended period  of time. His pain is not as constant as it was before. He will have episodes of shooting pain. He was able to stop taking oxycodone about one month ago. He is currently looking for work and did not want to have it in his system if he was required to have a urine drug test. He is taking acetaminophen. He had a repeat transthoracic echocardiogram on 11/14/2016. It showed moderate aortic insufficiency and moderate to severe aortic stenosis. His vegetation had resolved. He is currently doing physical therapy. He states that he is frustrated with his slow progress and has become slightly depressed. He wants to know if there are any restrictions on his activity.  Review of Systems: Review of Systems  Constitutional: Negative for chills,  diaphoresis, fever, malaise/fatigue and weight loss.  HENT: Negative for sore throat.   Respiratory: Negative for cough, sputum production and shortness of breath.   Cardiovascular: Negative for chest pain.  Gastrointestinal: Negative for abdominal pain, diarrhea, heartburn, nausea and vomiting.  Genitourinary: Negative for dysuria and frequency.  Musculoskeletal: Positive for back pain. Negative for joint pain and myalgias.  Skin: Negative for rash.  Neurological: Negative for dizziness and headaches.  Psychiatric/Behavioral: Positive for depression. Negative for substance abuse. The patient is not nervous/anxious.     Past Medical History:  Diagnosis Date  . Cold   . Decreased anal sphincter tone   . Heart valve replaced    [facility]   [facility]  . Insomnia   . Shortness of breath dyspnea   . Sleep apnea    cpap  . Varicose vein of leg   . Varicose veins     Social History  Substance Use Topics  . Smoking status: Never Smoker  . Smokeless tobacco: Never Used  . Alcohol use 0.0 oz/week     Comment: wine at night    Family History  Problem Relation Age of Onset  . Heart disease Father     No Known Allergies  Objective: Vitals:   11/24/16 0932  BP: 123/73  Pulse: 68  Temp: 97.9 F (36.6 C)  TempSrc: Oral  Weight: 245 lb (111.1 kg)  Height: 6' (1.829 m)   Body mass index is 33.23 kg/m.  Physical Exam  Constitutional: He is oriented to person, place, and time.  He has all his medications with him. He appears somewhat worried but otherwise in no distress.  HENT:  Mouth/Throat: No oropharyngeal exudate.  Eyes: Conjunctivae are normal.  Cardiovascular: Normal rate and regular rhythm.   Murmur heard. No change in his 2/6 systolic murmur  Pulmonary/Chest: Effort normal and breath sounds normal. He has no wheezes. He has no rales.  Abdominal: Soft. He exhibits no mass. There is no tenderness.  Musculoskeletal: Normal range of motion.  Neurological: He is alert and  oriented to person, place, and time.  He is still using his 3 point cane. He grimaces when rising from a sitting position. His gait is slow but otherwise normal.  Skin: No rash noted.  Psychiatric: Mood and affect normal.    Lab Results    Problem List Items Addressed This Visit      High   Lumbar discitis    He is still having back pain but overall he continues to improve 6 weeks after stopping antibiotic therapy. I believe that his discitis has been cured.      Prosthetic valve endocarditis ([place])    I believe that his MSSA bacteremia and prosthetic aortic valve endocarditis has been cured.  Unprioritized   Depression    He has mild, reactive depression related to the recent loss of his job and his acute illness. I reminded him how much improvement he has had compared to when he was first hospitalized. I told him that I believe his depression will resolve as cc more improvement. I encouraged him to continue physical therapy and told him that I may do not recommend any restrictions on his activities. I encouraged him to be as active as possible. He will follow-up with me in 2 months.          Michel Bickers, MD [facility] [phone] pager   [phone] cell 11/24/2016, 10:16 AM

## 2016-11-24 NOTE — Assessment & Plan Note (Signed)
He is still having back pain but overall he continues to improve 6 weeks after stopping antibiotic therapy. I believe that his discitis has been cured.

## 2016-11-24 NOTE — Assessment & Plan Note (Addendum)
I believe that his MSSA bacteremia and prosthetic aortic valve endocarditis has been cured.

## 2016-11-24 NOTE — Assessment & Plan Note (Signed)
He has mild, reactive depression related to the recent loss of his job and his acute illness. I reminded him how much improvement he has had compared to when he was first hospitalized. I told him that I believe his depression will resolve as cc more improvement. I encouraged him to continue physical therapy and told him that I may do not recommend any restrictions on his activities. I encouraged him to be as active as possible. He will follow-up with me in 2 months.

## 2016-12-25 NOTE — Progress Notes (Signed)
Cardiology Office Note   Date:  12/27/2016   ID:  Jeffery Whitehead, DOB 01-12-1956, MRN [id number]  PCP:  Dewayne Shorter, PA-C    No chief complaint on file. aortic valve disease   Wt Readings from Last 3 Encounters:  12/27/16 250 lb 6.4 oz (113.6 kg)  11/24/16 245 lb (111.1 kg)  10/31/16 236 lb (107 kg)       History of Present Illness: Jeffery Whitehead is a 61 y.o. male  who had an AVR in 19 at age 35, with a St Jude Valve; model number 25A-101; serial number [id number]; blood type A+. He has been on Coumadin since that time. He moved here from [place] in 2015 for his job at [employer]. He had a bicuspid aortic valve that was replaced. Only bleeding issue has been occasional bloody nose. He has had several cauterizations.   In 2015, he noticed some SHOB after he had a cold that has lasted a few weeks. He does have chronic dyspnea on exertion which has started since he gained weight. Echo at that time showed normal LV function.    He had some vein removal in the left leg with Dr. Kellie Simmering in 2015.  THis went well.  No leg swelling.  No bleeding issues.  THere are some veins in the right that are being watched.  He admitted and found to have MSSA bacteremia and endocarditis. From the discharge summary in 2/18: Sepsis (due to MSSA infection). -Presented with temperature of 101.3, respiratory rate of 33 and presence of bacteremia. -Patient given IV fluids, blood pressure was soft on admission. -Sepsis physiology resolved, continue current antibiotics. Patient on ancef and gentamicin for MSSA infection. Appears to be secondary to Lumbar discitis/endocarditis   MSSA bacteremia with endocarditis  -presumed to be secondary to Lumbar discitis  -TEE with small vegetation seen -discussed with ID; planning 6 weeks of IV Ancef and 2 weeks of IV gentamicin  -PICC line in place now. -will be follow on 09/27/16 at ID offices; keep line in place until evaluated by ID physician    Acute encephalopathy -Likely acute metabolic encephalopathy secondary to sepsis. -This is resolved patient is back to his baseline.  He had some increased LFTs as well.   He is done with his antibiotics.  He finished physical therapy.  He is trying to get this extended.  BP has been better.  Diastolic readings have been better.  Back pain limits him.    He had an echo in May 2018 showing normal LV function with moderate AI and moderate to severe AS.  We have waited for his ABx to be finished.  He thinks his energy level is at about 40%.  It is improving slowly.    He has been volunteering and feels tired after a few hours.  Past Medical History:  Diagnosis Date  . Cold   . Decreased anal sphincter tone   . Heart valve replaced    1983   st jude  . Insomnia   . Shortness of breath dyspnea   . Sleep apnea    cpap  . Varicose vein of leg   . Varicose veins     Past Surgical History:  Procedure Laterality Date  . AORTIC VALVE REPLACEMENT  1983  . FLUOROSCOPY GUIDANCE N/A 08/24/2016   Procedure: Fluoroscopy Guidance;  Surgeon: Leonie Man, MD;  Location: [facility];  Service: Cardiovascular;  Laterality: N/A;  . TEE WITHOUT CARDIOVERSION N/A 08/24/2016  Procedure: TRANSESOPHAGEAL ECHOCARDIOGRAM (TEE);  Surgeon: Pixie Casino, MD;  Location[facility] ENDOSCOPY;  Service: Cardiovascular;  Laterality: N/A;  . TONSILLECTOMY    . VEIN LIGATION AND STRIPPING Left 08/15/2014   Procedure: SAPHENOUS VEIN LIGATION AND EXCISION OF VARICOSITIES;  Surgeon: Mal Misty, MD;  Location: [facility];  Service: Vascular;  Laterality: Left;     Current Outpatient Prescriptions  Medication Sig Dispense Refill  . acetaminophen (TYLENOL) 325 MG tablet Take 2 tablets (650 mg total) by mouth every 6 (six) hours as needed for mild pain, fever or headache (or Fever >/= 101).    . budesonide-formoterol (SYMBICORT) 160-4.5 MCG/ACT inhaler Inhale 2 puffs into the lungs 2 times daily 1 Inhaler 6  .  Dextromethorphan-Guaifenesin (ROBITUSSIN COUGH+CHEST CONG DM) 5-100 MG/5ML LIQD Give 10 ml by mouth every 4 hours as needed for cough/congestion    . furosemide (LASIX) 40 MG tablet Take 40 mg by mouth daily as needed for edema.    Marland Kitchen lisinopril (PRINIVIL,ZESTRIL) 20 MG tablet Take 20 mg by mouth daily.    Marland Kitchen loratadine (CLARITIN) 10 MG tablet Take 10 mg by mouth daily as needed for allergies.     . methocarbamol (ROBAXIN) 500 MG tablet Take 1 tablet (500 mg total) by mouth every 8 (eight) hours as needed for muscle spasms.    . Multiple Vitamins-Minerals (CENTRUM SILVER ADULT 50+ PO) Take 1 tablet by mouth daily with breakfast.    . omeprazole (PRILOSEC) 20 MG capsule Take 1 capsule by mouth daily.    . potassium chloride SA (K-DUR,KLOR-CON) 20 MEQ tablet Take 1 tablet (20 mEq total) by mouth daily as needed. TAKE ONLY if taking furosemide (lasix) 30 tablet 11  . PREVIDENT 5000 BOOSTER PLUS 1.1 % PSTE USE A THIN RIBBON OF PASTE ON TOOTHBRUSH NIGHTLY BEFORE BED. DO NOT RINSE  6  . senna (SENOKOT) 8.6 MG TABS tablet Take 1 tablet by mouth 2 (two) times daily. Hold for diarrhea    . terbinafine (LAMISIL) 250 MG tablet Take 1 tablet (250 mg total) by mouth daily.    Marland Kitchen warfarin (COUMADIN) 7.5 MG tablet Take 7.5 mg by mouth daily.  1  . zolpidem (AMBIEN) 10 MG tablet Take 10 mg by mouth at bedtime.      No current facility-administered medications for this visit.     Allergies:   Patient has no known allergies.    Social History:  The patient  reports that he has never smoked. He has never used smokeless tobacco. He reports that he drinks alcohol. He reports that he does not use drugs.   Family History:  The patient's family history includes Heart disease in his father.    ROS:  Please see the history of present illness.   Otherwise, review of systems are positive for rare nosebleeds, better now.   All other systems are reviewed and negative.    PHYSICAL EXAM: VS:  BP 110/68   Pulse 68   Ht  6' (1.829 m)   Wt 250 lb 6.4 oz (113.6 kg)   SpO2 98%   BMI 33.96 kg/m  , BMI Body mass index is 33.96 kg/m. GEN: Well nourished, well developed, in no acute distress  HEENT: normal  Neck: no JVD, carotid bruits, or masses Cardiac: RRR; soft systolic murmur, crisp S2 click, no rubs, or gallops,no edema - has compression stockings Respiratory:  clear to auscultation bilaterally, normal work of breathing GI: soft, nontender, nondistended, + BS MS: no deformity or atrophy  Skin:  warm and dry, no rash; stuck on mole on the right tricep area- likely seborrheic keratosis Neuro:  Strength and sensation are intact Psych: euthymic mood, full affect   EKG:   The ekg ordered today demonstrates NSR, inferior and lateral ST depression which are unchanged   Recent Labs: 09/07/2016: ALT 12; B Natriuretic Peptide 78.0 09/15/2016: BUN 22; Creatinine, Ser 1.21; Hemoglobin 10.1; Platelets 314; Potassium 4.2; Sodium 136   Lipid Panel    Component Value Date/Time   CHOL 174 03/08/2016 0837   TRIG 157 (H) 03/08/2016 0837   HDL 47 03/08/2016 0837   CHOLHDL 3.7 03/08/2016 0837   VLDL 31 (H) 03/08/2016 0837   LDLCALC 96 03/08/2016 0837     Other studies Reviewed: Additional studies/ records that were reviewed today with results demonstrating: May 2018 echo result reviewed; LDL in 9/17:  96.   ASSESSMENT AND PLAN:  1. S/p AVR: Moderate AI, and moderate to severe AS.   2. Endocarditis: Antibiotics completed.  Valve function decreased since endocarditis occurred. 3. Anticoagulated: Continue Coumadin for the valve.   4. Elevated BP: Controlled today. 5. Varicose veins/ LE edema: Prior procedures by [facility].  Use prn Lasix for swelling.  6. F/u with PMD for likely seborrheic keratosis.   Current medicines are reviewed at length with the patient today.  The patient concerns regarding his medicines were addressed.  The following changes have been made:  No change  Labs/ tests ordered today include:    No orders of the defined types were placed in this encounter.   Recommend 150 minutes/week of aerobic exercise Low fat, low carb, high fiber diet recommended  Disposition:   FU in 6 months   Signed, Larae Grooms, MD  12/27/2016 8:50 AM    [facility] [place], [place], [place]  [address] Phone: [phone]; Fax: [phone]

## 2016-12-27 ENCOUNTER — Encounter: Payer: Self-pay | Admitting: Interventional Cardiology

## 2016-12-27 ENCOUNTER — Ambulatory Visit (INDEPENDENT_AMBULATORY_CARE_PROVIDER_SITE_OTHER): Payer: 59 | Admitting: Interventional Cardiology

## 2016-12-27 VITALS — BP 110/68 | HR 68 | Ht 72.0 in | Wt 250.4 lb

## 2016-12-27 DIAGNOSIS — Z952 Presence of prosthetic heart valve: Secondary | ICD-10-CM

## 2016-12-27 DIAGNOSIS — T826XXD Infection and inflammatory reaction due to cardiac valve prosthesis, subsequent encounter: Secondary | ICD-10-CM

## 2016-12-27 DIAGNOSIS — I83899 Varicose veins of unspecified lower extremities with other complications: Secondary | ICD-10-CM

## 2016-12-27 DIAGNOSIS — I38 Endocarditis, valve unspecified: Principal | ICD-10-CM

## 2016-12-27 DIAGNOSIS — R600 Localized edema: Secondary | ICD-10-CM | POA: Diagnosis not present

## 2016-12-27 DIAGNOSIS — Z7901 Long term (current) use of anticoagulants: Secondary | ICD-10-CM | POA: Diagnosis not present

## 2016-12-27 DIAGNOSIS — I350 Nonrheumatic aortic (valve) stenosis: Secondary | ICD-10-CM

## 2016-12-27 DIAGNOSIS — I351 Nonrheumatic aortic (valve) insufficiency: Secondary | ICD-10-CM | POA: Insufficient documentation

## 2016-12-27 NOTE — Patient Instructions (Signed)

## 2016-12-28 ENCOUNTER — Telehealth: Payer: Self-pay | Admitting: Interventional Cardiology

## 2016-12-28 NOTE — Telephone Encounter (Signed)
Patient stating that he has been getting his INR checked at his PCP and that each time he is charged as an office visit and wants to know if he can have his INR checked here cheaper. I told the patient that having it checked in our coumadin clinic may be cheaper. I advised for patient to call back tomorrow during normal business hours and ask to speak to the billing department. Patient verbalized understanding.

## 2016-12-28 NOTE — Telephone Encounter (Signed)
New Message:   Pt wants to know if Dr Irish Lack would write an an order so he can his INR drawn at a lab.He wants to go to a lab that he will not be charged for an office visit.

## 2016-12-28 NOTE — Telephone Encounter (Signed)
Left message for patient to call back  

## 2016-12-30 ENCOUNTER — Other Ambulatory Visit: Payer: Self-pay | Admitting: Internal Medicine

## 2016-12-30 ENCOUNTER — Encounter: Payer: Self-pay | Admitting: Interventional Cardiology

## 2016-12-30 ENCOUNTER — Telehealth: Payer: Self-pay | Admitting: Internal Medicine

## 2016-12-30 ENCOUNTER — Encounter: Payer: Self-pay | Admitting: Internal Medicine

## 2016-12-30 DIAGNOSIS — M4646 Discitis, unspecified, lumbar region: Secondary | ICD-10-CM

## 2016-12-30 NOTE — Telephone Encounter (Signed)
Jeffery Whitehead,  I would not expect you to be having that much pain at this point. I recommend repeat blood work and another MRI to evaluate what is going on. I will ask my nurse to help arrange this.  Jeffery Whitehead ===View-only below this line===   ----- Message -----    From: [name]    Sent: 12/30/2016 11:16 AM EDT      To: Michel Bickers, MD Subject: Non-Urgent Medical Question  Dr. Megan Salon (2 of 2), I also have pain at night getting into bed as well as difficulty in the morning getting out of bed.   This back pain is getting me a bit down as I do not feel I have the capability to work.  The [facility] pharmacist suggested that I should ask my physicians if I could take Cymbalta (Duloxtine) which would help as both an anti-depressant but also helpful with nerve pain.  Said this may be good to possibly replace the Robaxin (only taking 1/wk) or Tylenol.      I did call my primary care physician on a couple items including this and was firmly denied.  The rely I received was "The person suggesting [facility]) is not a doctor, no."  I would like to get your thoughts on this.  Also, the [facility] Pharmacist suggested that since the infection is gone now, I could start using heat on my back to relax my muscles.  Should I be using heat on my back?  Thanks,  [name]

## 2017-01-03 ENCOUNTER — Other Ambulatory Visit: Payer: 59

## 2017-01-03 DIAGNOSIS — M4646 Discitis, unspecified, lumbar region: Secondary | ICD-10-CM

## 2017-01-04 LAB — SEDIMENTATION RATE: SED RATE: 12 mm/h (ref 0–20)

## 2017-01-06 ENCOUNTER — Other Ambulatory Visit: Payer: Self-pay | Admitting: Internal Medicine

## 2017-01-06 ENCOUNTER — Telehealth: Payer: Self-pay | Admitting: *Deleted

## 2017-01-06 DIAGNOSIS — G8929 Other chronic pain: Secondary | ICD-10-CM | POA: Insufficient documentation

## 2017-01-06 DIAGNOSIS — M549 Dorsalgia, unspecified: Principal | ICD-10-CM

## 2017-01-06 LAB — C-REACTIVE PROTEIN: CRP: 4.9 mg/L (ref <8.0)

## 2017-01-06 NOTE — Telephone Encounter (Signed)
Called [facility] to schedule MRI and this has already been scheduled for 01/15/17 and patient is aware. I did receive prior authorization for this test today and it is good through 02/20/17. PA # is [phone].

## 2017-01-15 ENCOUNTER — Ambulatory Visit
Admission: RE | Admit: 2017-01-15 | Discharge: 2017-01-15 | Disposition: A | Discharge status: Home / Self Care | Payer: 59 | Source: Ambulatory Visit | Attending: Internal Medicine | Admitting: Internal Medicine

## 2017-01-15 DIAGNOSIS — M4646 Discitis, unspecified, lumbar region: Secondary | ICD-10-CM

## 2017-01-15 MED ORDER — GADOBENATE DIMEGLUMINE 529 MG/ML IV SOLN
20.0000 mL | Freq: Once | INTRAVENOUS | Status: AC | PRN
  Administered 2017-01-15: 20 mL via INTRAVENOUS

## 2017-01-16 ENCOUNTER — Encounter: Payer: Self-pay | Admitting: Internal Medicine

## 2017-01-18 ENCOUNTER — Encounter: Payer: Self-pay | Admitting: Internal Medicine

## 2017-01-18 ENCOUNTER — Ambulatory Visit (INDEPENDENT_AMBULATORY_CARE_PROVIDER_SITE_OTHER): Payer: 59 | Admitting: Internal Medicine

## 2017-01-18 DIAGNOSIS — M4646 Discitis, unspecified, lumbar region: Secondary | ICD-10-CM

## 2017-01-18 MED ORDER — GABAPENTIN 300 MG PO CAPS: 300.0000 mg | ORAL_CAPSULE | Freq: Three times a day (TID) | ORAL | 3 refills | Status: DC

## 2017-01-18 NOTE — Assessment & Plan Note (Signed)
I believe that his MSSA lumbar infection has been cured. His inflammatory markers have returned to normal and he has made slow progress with his pain in physical therapy since completing antibiotics a little over 3 months ago. I encouraged him to continue his physical therapy. I will give him a trial of gabapentin to see if that helps. He will follow-up here in 6 weeks.

## 2017-01-18 NOTE — Progress Notes (Signed)
Jeffery Whitehead for Infectious Disease  Patient Active Problem List   Diagnosis Date Noted  . Prosthetic valve endocarditis ([place]) 08/25/2016    Priority: High  . Lumbar discitis 08/25/2016    Priority: High  . Staphylococcus aureus bacteremia with sepsis ([place]) 08/19/2016    Priority: High  . S/P AVR (aortic valve replacement) 06/19/2014    Priority: Medium  . Exacerbation of chronic back pain 01/06/2017  . Aortic insufficiency 12/27/2016  . Aortic stenosis 12/27/2016  . Depression 11/24/2016  . Onychomycosis 10/28/2016  . Bilateral lower extremity edema 09/13/2016  . Anticoagulated 09/12/2016  . Unsteady gait   . Thrombocytopenia ([place]) 08/19/2016  . HTN (hypertension) 08/19/2016  . Hypokalemia 08/18/2016  . Asthma, chronic, moderate persistent, uncomplicated 01/75/1025  . Normocytic anemia 08/18/2016  . OSA (obstructive sleep apnea) 05/21/2014  . Varicose veins of leg with complications 85/27/7824    Patient's Medications  New Prescriptions   GABAPENTIN (NEURONTIN) 300 MG CAPSULE    Take 1 capsule (300 mg total) by mouth 3 (three) times daily.  Previous Medications   ACETAMINOPHEN (TYLENOL) 325 MG TABLET    Take 2 tablets (650 mg total) by mouth every 6 (six) hours as needed for mild pain, fever or headache (or Fever >/= 101).   BUDESONIDE-FORMOTEROL (SYMBICORT) 160-4.5 MCG/ACT INHALER    Inhale 2 puffs into the lungs 2 times daily   DEXTROMETHORPHAN-GUAIFENESIN (ROBITUSSIN COUGH+CHEST CONG DM) 5-100 MG/5ML LIQD    Give 10 ml by mouth every 4 hours as needed for cough/congestion   FUROSEMIDE (LASIX) 40 MG TABLET    Take 40 mg by mouth daily as needed for edema.   LISINOPRIL (PRINIVIL,ZESTRIL) 20 MG TABLET    Take 20 mg by mouth daily.   LORATADINE (CLARITIN) 10 MG TABLET    Take 10 mg by mouth daily as needed for allergies.    METHOCARBAMOL (ROBAXIN) 500 MG TABLET    Take 1 tablet (500 mg total) by mouth every 8 (eight) hours as needed for muscle spasms.   MULTIPLE VITAMINS-MINERALS (CENTRUM SILVER ADULT 50+ PO)    Take 1 tablet by mouth daily with breakfast.   OMEPRAZOLE (PRILOSEC) 20 MG CAPSULE    Take 1 capsule by mouth daily.   POTASSIUM CHLORIDE SA (K-DUR,KLOR-CON) 20 MEQ TABLET    Take 1 tablet (20 mEq total) by mouth daily as needed. TAKE ONLY if taking furosemide (lasix)   PREVIDENT 5000 BOOSTER PLUS 1.1 % PSTE    USE A THIN RIBBON OF PASTE ON TOOTHBRUSH NIGHTLY BEFORE BED. DO NOT RINSE   SENNA (SENOKOT) 8.6 MG TABS TABLET    Take 1 tablet by mouth 2 (two) times daily. Hold for diarrhea   TERBINAFINE (LAMISIL) 250 MG TABLET    Take 1 tablet (250 mg total) by mouth daily.   WARFARIN (COUMADIN) 7.5 MG TABLET    Take 7.5 mg by mouth daily.   ZOLPIDEM (AMBIEN) 10 MG TABLET    Take 10 mg by mouth at bedtime.   Modified Medications   No medications on file  Discontinued Medications   No medications on file    Subjective: [name] is in for his routine follow-up visit. He completed 8 weeks of IV cefazolin for MSSA bacteremia complicated by possible prosthetic aortic valve endocarditis and lumbar discitis on 10/11/2016. He states that his back pain improved slowly the first 2 months he was off of antibiotics but has plateaued since that time. He is continuing physical therapy. He takes  Tylenol every morning and every evening. He has not taken any oxycodone and over 2 months. He only takes Robaxin very rarely. His low back pain is worse when he is getting out of bed in the morning and when he first gets in bed in the evening. At those times it can be 7 or 8 out of 10. Normally it's around 2 out of 10. He says his slow improvement has made him feel somewhat depressed. He has also not been able to find a job since he lost his job last year.  Review of Systems: Review of Systems  Constitutional: Negative for chills, diaphoresis, fever and weight loss.  Gastrointestinal: Negative for abdominal pain, diarrhea, nausea and vomiting.  Musculoskeletal:  Positive for back pain.    Past Medical History:  Diagnosis Date  . Cold   . Decreased anal sphincter tone   . Heart valve replaced    1983   st jude  . Insomnia   . Shortness of breath dyspnea   . Sleep apnea    cpap  . Varicose vein of leg   . Varicose veins     Social History  Substance Use Topics  . Smoking status: Never Smoker  . Smokeless tobacco: Never Used  . Alcohol use 0.0 oz/week     Comment: wine at night    Family History  Problem Relation Age of Onset  . Heart disease Father     No Known Allergies  Objective: Vitals:   01/18/17 1420  BP: 121/73  Pulse: 60  Temp: 98.5 F (36.9 C)  Weight: 255 lb (115.7 kg)   Body mass index is 34.58 kg/m.  Physical Exam  Constitutional: He is oriented to person, place, and time.  He is in good spirits.  Cardiovascular: Normal rate and regular rhythm.   No murmur heard. Pulmonary/Chest: Effort normal and breath sounds normal.  Abdominal: Soft. There is no tenderness.  Musculoskeletal: Normal range of motion. He exhibits no edema or tenderness.  Neurological: He is alert and oriented to person, place, and time. Gait normal.  Skin: No rash noted.  Psychiatric: Mood and affect normal.    Lab Results Sed Rate (mm/hr)  Date Value  01/03/2017 12  09/08/2016 86 (H)  08/26/2016 72 (H)   CRP  Date Value  01/03/2017 4.9 mg/L  09/08/2016 6.9 mg/dL (H)  08/26/2016 17.3 mg/dL (H)    MRI Lumbar spine 01/15/2017  IMPRESSION: Endplate edema and enhancement at L5-S1 consistent with discitis and osteomyelitis persists but has improved since the most recent examination. No epidural abscess or other new abnormality is seen.  Bilateral L5 pars interarticularis defects result in 0.5 cm anterolisthesis. Right worse than left foraminal narrowing is unchanged.   Electronically Signed   By: Inge Rise M.D.   On: 01/15/2017 14:33   Problem List Items Addressed This Visit      High   Lumbar discitis     I believe that his MSSA lumbar infection has been cured. His inflammatory markers have returned to normal and he has made slow progress with his pain in physical therapy since completing antibiotics a little over 3 months ago. I encouraged him to continue his physical therapy. I will give him a trial of gabapentin to see if that helps. He will follow-up here in 6 weeks.      Relevant Medications   gabapentin (NEURONTIN) 300 MG capsule       Michel Bickers, MD [facility] [place]  [facility] [id number] pager   [phone] cell 01/18/2017, 3:26 PM

## 2017-01-23 ENCOUNTER — Encounter: Payer: Self-pay | Admitting: Emergency Medicine

## 2017-01-23 ENCOUNTER — Ambulatory Visit (INDEPENDENT_AMBULATORY_CARE_PROVIDER_SITE_OTHER): Payer: 59 | Admitting: Emergency Medicine

## 2017-01-23 DIAGNOSIS — J4540 Moderate persistent asthma, uncomplicated: Secondary | ICD-10-CM | POA: Diagnosis not present

## 2017-01-23 DIAGNOSIS — G4733 Obstructive sleep apnea (adult) (pediatric): Secondary | ICD-10-CM

## 2017-01-23 NOTE — Patient Instructions (Signed)
Please continue to wear your CPAP every night Continue Symbicort 2 puffs twice a day. Remember to rinse and gargle after using this medication. Use albuterol 2 puffs as needed for shortness of breath. Continue to use your peak flow meter daily and keep track of your results Follow with Dr Lamonte Sakai in 12 months or sooner if you have any problems

## 2017-01-23 NOTE — Assessment & Plan Note (Signed)
Good compliance and well managed on his current CPAP.

## 2017-01-23 NOTE — Assessment & Plan Note (Signed)
He felt like he missed the Symbicort. He has not restarted it. We will continue. Albuterol as needed. No exacerbations.  Please continue to wear your CPAP every night Continue Symbicort 2 puffs twice a day. Remember to rinse and gargle after using this medication. Use albuterol 2 puffs as needed for shortness of breath. Continue to use your peak flow meter daily and keep track of your results Follow with Dr Lamonte Sakai in 12 months or sooner if you have any problems

## 2017-01-23 NOTE — Progress Notes (Signed)
Subjective:    Patient ID: Jeffery Whitehead, male    DOB: [birth year], 61 y.o.   MRN: [id number]  Shortness of Breath  Pertinent negatives include no ear pain, fever, headaches, leg swelling, rash, rhinorrhea, sore throat, vomiting or wheezing.   The patient is a 62 year old male who I have been asked to see for management of obstructive sleep apnea. The patient was diagnosed with moderate obstructive sleep apnea and 2013 while living in [place], and he had an AHI of 21 events per hour. He was started on C Pap with a titrated pressure to 9 cm, and has been wearing the device compliantly since that time. He currently has dated supplies, including his hoses, humidity chamber, and mask with headgear. The patient has a long-standing history of chronic sleep onset insomnia, for which she usually takes Ambien. The patient states that he sleeps well once he gets to sleep, and feels rested when he first arises in the morning. However, he does not feel energetic during the day, and makes the point that he does worse when wearing the device then not. However, he does not describe daytime sleepiness, and his Epworth score today is only 4. We have gotten a download today that shows excellent control of his AHI, good compliance, but he does have excessive mask leak. The patient states that his weight is up 35 pounds over the last 2 years.  ROV 06/09/15 -- follow up visit for OSA on CPAP 9 cm H2O since 2013. He was seen by Dr Gwenette Greet 05/2014. Also takes [place] for sleep onset insomnia. He returns today reporting that he is using reliably, almost every night. He has new equipment since last year - uses . He no longer snores, does feel that he benefits, better breathing. He has been feeling tired, but there are distractions at home and total sleep time is now decreased. He uses a nasal mask, has some occasional leak but not bad.  Download shows he is on auto-set mode, uses the machine > 4 hours 29 of 32 days in last month. AHI  is 1.3, median leak 0 (95th pecentile 21). He reports that his wt is stable from last year. He has some exertional SOB. Is able to walk 3-4 miles a day.   ROV 09/08/15 -- patient follows up today for his history of obstructive sleep apnea which we are treating with CPAP. He also has sleep onset insomnia for which he uses Ambien. At our last visit we discussed dyspnea that has been persistent. Based on this we performed full pulmonary function testing today that I have personally reviewed. This shows moderate obstruction with an FEV1 of 71% predicted, vigorous bronchodilator response with a 22% improvement. His lung volumes are normal and his diffusion capacity is normal.    ROV 02/09/16 -- Patient has a history of obstructive sleep apnea on CPAP as well as insomnia for which he uses Ambien. Also with a history of dyspnea. He has obstructive lung disease noted on his pulmonary function testing with a bronchodilator response consistent with an asthmatic pattern.   He feels that he is having some leak, the download confirms this - the device has changed and the chin strap isn't working as well.  He is compliant with the Symbicort, states that his exertional SOB may be slightly better. He admits that he hasn't been as active lately.  He is compliant with his CPAP. I reviewed the download information for the last 30 days. He uses his CPAP 29 out  of 30 days (97%), 73% of the time greater than 4 hours, 23% of the time less than 4 hours. He has clear benefit with less daytime sleepiness, more energy, better sleep quality. He does have significant leak when he is at higher pressures, 23.9 L/m.   ROV  01/23/17 -- this follow-up visit for obstructive lung disease with an asthmatic pattern, obstructive sleep apnea for which he uses CPAP. He also has insomnia and is on [place]. We have tried him in the past on Symbicort to see if he would benefit. We stopped this last year after was unclear that it has helped. We also added a  chinstrap to his CPAP. He reports that he was hospitalized for sepsis and endocarditis, has been managed by Dr Megan Salon with ID. Has some trouble with embolic disease to his back w associated back pain. He has good compliance with his CPAP. Compliance download confirms that he has used it 96% of the last 90 days, 87% for greater than 4 hours. Good control. He does have moderate leak averaging 20.4 L/m. believes that he probably benefits - sleeps through the night, rarely has daytime fatigue. No unintentional naps. He reports that his breathing is stable, currently on Symbicort. Rare albuterol use. He uses peak flow meter reliably - usually ~550cc     Review of Systems  Constitutional: Negative for fever and unexpected weight change.  HENT: Negative for congestion, dental problem, ear pain, nosebleeds, postnasal drip, rhinorrhea, sinus pressure, sneezing, sore throat and trouble swallowing.   Eyes: Negative for redness and itching.  Respiratory: Positive for shortness of breath. Negative for cough, chest tightness and wheezing.   Cardiovascular: Negative for palpitations and leg swelling.  Gastrointestinal: Negative for nausea and vomiting.  Genitourinary: Negative for dysuria.  Musculoskeletal: Negative for joint swelling.  Skin: Negative for rash.  Neurological: Negative for headaches.  Hematological: Does not bruise/bleed easily.  Psychiatric/Behavioral: Negative for dysphoric mood. The patient is not nervous/anxious.        Objective:   Physical Exam  Vitals:   01/23/17 1408  BP: 124/70  Pulse: 69  SpO2: 98%  Weight: 256 lb (116.1 kg)  Height: 6' (1.829 m)   Gen: Pleasant, well-nourished, in no distress,  normal affect  ENT: No lesions,  mouth clear,  oropharynx clear, no postnasal drip  Neck: No JVD, no TMG, no carotid bruits  Lungs: No use of accessory muscles, clear without rales or rhonchi  Cardiovascular: RRR, heart sounds normal, no murmur or gallops, no peripheral  edema  Musculoskeletal: No deformities, no cyanosis or clubbing  Neuro: alert, non focal  Skin: Warm, no lesions or rashes     Assessment & Plan:  OSA (obstructive sleep apnea) Good compliance and well managed on his current CPAP.  Asthma, chronic, moderate persistent, uncomplicated He felt like he missed the Symbicort. He has not restarted it. We will continue. Albuterol as needed. No exacerbations.  Please continue to wear your CPAP every night Continue Symbicort 2 puffs twice a day. Remember to rinse and gargle after using this medication. Use albuterol 2 puffs as needed for shortness of breath. Continue to use your peak flow meter daily and keep track of your results Follow with Dr Lamonte Sakai in 12 months or sooner if you have any problems  Baltazar Apo, MD, PhD 01/23/2017, 2:23 PM [facility] [phone] or if no answer [phone]

## 2017-01-24 ENCOUNTER — Ambulatory Visit: Payer: 59 | Admitting: Internal Medicine

## 2017-02-01 ENCOUNTER — Encounter: Payer: Self-pay | Admitting: Interventional Cardiology

## 2017-02-02 ENCOUNTER — Encounter: Payer: Self-pay | Admitting: Interventional Cardiology

## 2017-02-06 ENCOUNTER — Encounter: Payer: Self-pay | Admitting: Interventional Cardiology

## 2017-02-15 ENCOUNTER — Encounter: Payer: Self-pay | Admitting: Interventional Cardiology

## 2017-02-16 ENCOUNTER — Encounter: Payer: Self-pay | Admitting: Interventional Cardiology

## 2017-02-16 ENCOUNTER — Encounter: Payer: Self-pay | Admitting: Internal Medicine

## 2017-02-17 ENCOUNTER — Other Ambulatory Visit: Payer: Self-pay

## 2017-02-17 ENCOUNTER — Encounter: Payer: Self-pay | Admitting: Internal Medicine

## 2017-02-17 ENCOUNTER — Telehealth: Payer: Self-pay

## 2017-02-17 DIAGNOSIS — Z952 Presence of prosthetic heart valve: Secondary | ICD-10-CM

## 2017-02-17 NOTE — Telephone Encounter (Signed)
Called and spoke to patient regarding his recent emails in [place] and made him aware that Dr. Irish Lack has reviewed the information that he submitted and that we did not do bicycle stress echos. The patient states that he cannot do a treadmill. Made the patient aware that Dr. Irish Lack did not feel that he needed the stress portion at this time. Made the patient aware that the original plan was to repeat his echo in May (1 year after his last) but we could repeat it in 6 months instead. Patient was due for his appointment in December and decided to have his echocardiogram done the same day. Patient was scheduled for F/U appointment with Dr. Irish Lack and an echocardiogram on 12/17. Patient verbalizes understanding and is in agreement with this plan. Will forward to Dr. Irish Lack to make him aware.

## 2017-03-07 ENCOUNTER — Ambulatory Visit: Payer: 59 | Admitting: Internal Medicine

## 2017-04-12 ENCOUNTER — Encounter: Payer: Self-pay | Admitting: Internal Medicine

## 2017-04-12 ENCOUNTER — Ambulatory Visit (INDEPENDENT_AMBULATORY_CARE_PROVIDER_SITE_OTHER): Payer: 59 | Admitting: Internal Medicine

## 2017-04-12 DIAGNOSIS — Z23 Encounter for immunization: Secondary | ICD-10-CM

## 2017-04-12 DIAGNOSIS — A4101 Sepsis due to Methicillin susceptible Staphylococcus aureus: Secondary | ICD-10-CM | POA: Diagnosis not present

## 2017-04-12 NOTE — Assessment & Plan Note (Signed)
His MSSA bacteremia, prosthetic aortic valve endocarditis and lumbar infection have all been cured. He can follow-up here as needed. He received his influenza vaccination today.

## 2017-04-12 NOTE — Progress Notes (Signed)
[facility]  Patient Active Problem List   Diagnosis Date Noted  . Prosthetic valve endocarditis ([place]) 08/25/2016    Priority: High  . Lumbar discitis 08/25/2016    Priority: High  . Staphylococcus aureus bacteremia with sepsis ([place]) 08/19/2016    Priority: High  . S/P AVR (aortic valve replacement) 06/19/2014    Priority: Medium  . Exacerbation of chronic back pain 01/06/2017  . Aortic insufficiency 12/27/2016  . Aortic stenosis 12/27/2016  . Depression 11/24/2016  . Onychomycosis 10/28/2016  . Bilateral lower extremity edema 09/13/2016  . Anticoagulated 09/12/2016  . Unsteady gait   . Thrombocytopenia ([place]) 08/19/2016  . HTN (hypertension) 08/19/2016  . Hypokalemia 08/18/2016  . Asthma, chronic, moderate persistent, uncomplicated 75/64/3329  . Normocytic anemia 08/18/2016  . OSA (obstructive sleep apnea) 05/21/2014  . Varicose veins of leg with complications 51/88/4166    Patient's Medications  New Prescriptions   No medications on file  Previous Medications   ACETAMINOPHEN (TYLENOL) 325 MG TABLET    Take 2 tablets (650 mg total) by mouth every 6 (six) hours as needed for mild pain, fever or headache (or Fever >/= 101).   BUDESONIDE-FORMOTEROL (SYMBICORT) 160-4.5 MCG/ACT INHALER    Inhale 2 puffs into the lungs 2 times daily   DEXTROMETHORPHAN-GUAIFENESIN (ROBITUSSIN COUGH+CHEST CONG DM) 5-100 MG/5ML LIQD    Give 10 ml by mouth every 4 hours as needed for cough/congestion   FUROSEMIDE (LASIX) 40 MG TABLET    Take 40 mg by mouth daily as needed for edema.   LINZESS 72 MCG CAPSULE    Take 72 mcg by mouth daily.   LISINOPRIL (PRINIVIL,ZESTRIL) 20 MG TABLET    Take 20 mg by mouth daily.   LORATADINE (CLARITIN) 10 MG TABLET    Take 10 mg by mouth daily as needed for allergies.    METHOCARBAMOL (ROBAXIN) 500 MG TABLET    Take 1 tablet (500 mg total) by mouth every 8 (eight) hours as needed for muscle spasms.   MULTIPLE VITAMINS-MINERALS  (CENTRUM SILVER ADULT 50+ PO)    Take 1 tablet by mouth daily with breakfast.   OMEPRAZOLE (PRILOSEC) 20 MG CAPSULE    Take 1 capsule by mouth daily.   POTASSIUM CHLORIDE SA (K-DUR,KLOR-CON) 20 MEQ TABLET    Take 1 tablet (20 mEq total) by mouth daily as needed. TAKE ONLY if taking furosemide (lasix)   PREVIDENT 5000 BOOSTER PLUS 1.1 % PSTE    USE A THIN RIBBON OF PASTE ON TOOTHBRUSH NIGHTLY BEFORE BED. DO NOT RINSE   ROSUVASTATIN (CRESTOR) 5 MG TABLET    Take 5 mg by mouth.   TERBINAFINE (LAMISIL) 250 MG TABLET    Take 1 tablet (250 mg total) by mouth daily.   WARFARIN (COUMADIN) 7.5 MG TABLET    Take 7.5 mg by mouth daily. 7.5mg  Sunday, Tuesday, Thursday and Saturday. 3.5mg  other days.   ZOLPIDEM (AMBIEN) 10 MG TABLET    Take 10 mg by mouth at bedtime.   Modified Medications   No medications on file  Discontinued Medications   GABAPENTIN (NEURONTIN) 300 MG CAPSULE    Take 1 capsule (300 mg total) by mouth 3 (three) times daily.    Subjective: Jeffery Whitehead is in for his routine follow-up visit. He completed 8 weeks of IV cefazolin and rifampin on 10/11/2016 for his MSSA prosthetic aortic valve endocarditis and lumbar infection. He has had no signs of recurrent infection but he still struggles with low  back pain. He did get an initial steroid injection from Dr. Ike Bene about 6 weeks ago. He felt like it did help. He was able to drive to [facility] where he has been vacationing the past 6 weeks. He drove back home yesterday. He is scheduled to see Dr. Maia Petties next week. He is only requiring acetaminophen for pain.  Review of Systems: Review of Systems  Constitutional: Negative for chills, diaphoresis and fever.  Musculoskeletal: Positive for back pain.    Past Medical History:  Diagnosis Date  . Cold   . Decreased anal sphincter tone   . Heart valve replaced    1983   st jude  . Insomnia   . Shortness of breath dyspnea   . Sleep apnea    cpap  . Varicose vein of leg   . Varicose veins      Social History  Substance Use Topics  . Smoking status: Never Smoker  . Smokeless tobacco: Never Used  . Alcohol use 0.0 oz/week     Comment: wine at night    Family History  Problem Relation Age of Onset  . Heart disease Father     No Known Allergies  Objective: Vitals:   04/12/17 1351  BP: 119/72  Pulse: 69  Temp: 98 F (36.7 C)  TempSrc: Oral  Weight: 255 lb (115.7 kg)  Height: 6' (1.829 m)   Body mass index is 34.58 kg/m.  Physical Exam  Constitutional: He is oriented to person, place, and time.  He is in good spirits. He has gained 17 pounds since March.  Cardiovascular: Normal rate and regular rhythm.   Murmur heard. Early 1 to 2/6 systolic murmur.  Pulmonary/Chest: Effort normal and breath sounds normal.  Neurological: He is alert and oriented to person, place, and time. Gait normal.  Skin: No rash noted.  Psychiatric: Affect normal.    Lab Results    Problem List Items Addressed This Visit      High   Staphylococcus aureus bacteremia with sepsis ([place])    His MSSA bacteremia, prosthetic aortic valve endocarditis and lumbar infection have all been cured. He can follow-up here as needed. He received his influenza vaccination today.          Michel Bickers, MD [facility] [phone] pager   [phone] cell 04/12/2017, 2:13 PM

## 2017-05-03 ENCOUNTER — Encounter: Payer: Self-pay | Admitting: Internal Medicine

## 2017-06-18 NOTE — Progress Notes (Signed)
Cardiology Office Note   Date:  06/19/2017   ID:  Jeffery Whitehead, DOB [birth year], MRN [id number]  PCP:  Dewayne Shorter, PA-C    No chief complaint on file.  Aortic valve replacement  Wt Readings from Last 3 Encounters:  06/19/17 258 lb (117 kg)  04/12/17 255 lb (115.7 kg)  01/23/17 256 lb (116.1 kg)       History of Present Illness: Jeffery Whitehead is a 61 y.o. male   who had an AVR in 64 at age 65, with a St Jude Valve; model number 25A-101; serial number [id number]; blood type A+. He has been on Coumadin since that time. He moved here from [place] in 2015 for his job at [employer]. He had a bicuspid aortic valve that was replaced. Only bleeding issue has been occasional bloody nose. He has had several cauterizations.   In 2015, he noticed some SHOB after he had a cold that has lasted a few weeks. He does have chronic dyspnea on exertion which has started since he gained weight. Echo at that time showed normal LV function.   He had some vein removal in the left leg with Dr. Kellie Simmering in 2015. THis went well. No leg swelling. No bleeding issues. THere are some veins in the right that are being watched.  He admitted and found to have MSSA bacteremia and endocarditis. From the discharge summary in 2/18: Sepsis (due to MSSA infection). -Presented with temperature of 101.3, respiratory rate of 33 and presence of bacteremia. -Patient given IV fluids, blood pressure was soft on admission. -Sepsis physiology resolved, continue current antibiotics. Patient on ancef and gentamicin for MSSA infection. Appears to be secondary to Lumbar discitis/endocarditis   MSSA bacteremia with endocarditis  -presumed to be secondary to Lumbar discitis  -TEE with small vegetation seen -discussed with ID; planning 6 weeks of IV Ancef and 2 weeks of IV gentamicin  -PICC line in place now. -will be follow on 09/27/16 at ID offices; keep line in place until evaluated by ID physician    Acute encephalopathy -Likely acute metabolic encephalopathy secondary to sepsis. -This is resolved patient is back to his baseline.  He had an echo in May 2018 showing normal LV function with moderate AI and moderate to severe AS.    He was supposed to have a procedure on his back, but this was postponed due to last weeks snow.  Chronic back pain is his biggest issues.   Occasional blood in stool.  He has had issues with hemmorhoids.   Denies : Chest pain. Dizziness.  Nitroglycerin use. Orthopnea. Palpitations. Paroxysmal nocturnal dyspnea. Shortness of breath. Syncope.   Occasional leg swelling.    Past Medical History:  Diagnosis Date  . Cold   . Decreased anal sphincter tone   . Heart valve replaced    1983   st jude  . Insomnia   . Shortness of breath dyspnea   . Sleep apnea    cpap  . Varicose vein of leg   . Varicose veins     Past Surgical History:  Procedure Laterality Date  . AORTIC VALVE REPLACEMENT  1983  . FLUOROSCOPY GUIDANCE N/A 08/24/2016   Procedure: Fluoroscopy Guidance;  Surgeon: Leonie Man, MD;  Location: [facility];  Service: Cardiovascular;  Laterality: N/A;  . TEE WITHOUT CARDIOVERSION N/A 08/24/2016   Procedure: TRANSESOPHAGEAL ECHOCARDIOGRAM (TEE);  Surgeon: Pixie Casino, MD;  Location: [facility] ENDOS[facility];  Service: Cardiovascular;  Laterality: N/A;  . TONSILLECTOMY    .  VEIN LIGATION AND STRIPPING Left 08/15/2014   Procedure: SAPHENOUS VEIN LIGATION AND EXCISION OF VARICOSITIES;  Surgeon: Mal Misty, MD;  Location: [facility];  Service: Vascular;  Laterality: Left;     Current Outpatient Medications  Medication Sig Dispense Refill  . acetaminophen (TYLENOL) 325 MG tablet Take 2 tablets (650 mg total) by mouth every 6 (six) hours as needed for mild pain, fever or headache (or Fever >/= 101).    Marland Kitchen amoxicillin (AMOXIL) 500 MG capsule Take 4 capsules by mouth. Prior to dental procedures  2  . budesonide-formoterol (SYMBICORT) 160-4.5  MCG/ACT inhaler Inhale 2 puffs into the lungs 2 times daily 1 Inhaler 6  . furosemide (LASIX) 40 MG tablet Take 40 mg by mouth daily as needed for edema.    Marland Kitchen LINZESS 72 MCG capsule Take 72 mcg by mouth daily.  4  . lisinopril (PRINIVIL,ZESTRIL) 20 MG tablet Take 20 mg by mouth daily.    Marland Kitchen loratadine (CLARITIN) 10 MG tablet Take 10 mg by mouth daily as needed for allergies.     . methocarbamol (ROBAXIN) 500 MG tablet Take 1 tablet (500 mg total) by mouth every 8 (eight) hours as needed for muscle spasms.    . Multiple Vitamins-Minerals (CENTRUM SILVER ADULT 50+ PO) Take 1 tablet by mouth daily with breakfast.    . omeprazole (PRILOSEC) 20 MG capsule Take 1 capsule by mouth daily.    . potassium chloride SA (K-DUR,KLOR-CON) 20 MEQ tablet Take 1 tablet (20 mEq total) by mouth daily as needed. TAKE ONLY if taking furosemide (lasix) 30 tablet 11  . PREVIDENT 5000 BOOSTER PLUS 1.1 % PSTE USE A THIN RIBBON OF PASTE ON TOOTHBRUSH NIGHTLY BEFORE BED. DO NOT RINSE  6  . rosuvastatin (CRESTOR) 5 MG tablet Take 5 mg by mouth.    . warfarin (COUMADIN) 7.5 MG tablet Take 7.5 mg by mouth daily. 7.5mg  Sunday, Tuesday, Thursday and Saturday. 3.5mg  other days.  1  . zolpidem (AMBIEN) 10 MG tablet Take 10 mg by mouth at bedtime.      No current facility-administered medications for this visit.     Allergies:   Patient has no known allergies.    Social History:  The patient  reports that  has never smoked. he has never used smokeless tobacco. He reports that he drinks alcohol. He reports that he does not use drugs.   Family History:  The patient's family history includes Heart disease in his father.    ROS:  Please see the history of present illness.   Otherwise, review of systems are positive for leg swelling.   All other systems are reviewed and negative.    PHYSICAL EXAM: VS:  BP 128/68   Pulse 65   Ht 6' (1.829 m)   Wt 258 lb (117 kg)   SpO2 97%   BMI 34.99 kg/m  , BMI Body mass index is 34.99  kg/m. GEN: Well nourished, well developed, in no acute distress  HEENT: normal  Neck: no JVD, carotid bruits, or masses Cardiac: RRR; 3/6 systolic murmur, no rubs, or gallops,no edema  Respiratory:  clear to auscultation bilaterally, normal work of breathing GI: soft, nontender, nondistended, + BS MS: no deformity or atrophy  Skin: warm and dry, no rash Neuro:  Strength and sensation are intact Psych: euthymic mood, full affect    Recent Labs: 09/07/2016: ALT 12; B Natriuretic Peptide 78.0 09/15/2016: BUN 22; Creatinine, Ser 1.21; Hemoglobin 10.1; Platelets 314; Potassium 4.2; Sodium 136   Lipid  Panel    Component Value Date/Time   CHOL 174 03/08/2016 0837   TRIG 157 (H) 03/08/2016 0837   HDL 47 03/08/2016 0837   CHOLHDL 3.7 03/08/2016 0837   VLDL 31 (H) 03/08/2016 0837   LDLCALC 96 03/08/2016 0837     Other studies Reviewed: Additional studies/ records that were reviewed today with results demonstrating: 5/18 echo reviewed.   ASSESSMENT AND PLAN:  1. S/p AVR: moderate AI and moderate to severe AS.  Echo done today.  We will call the patient with results.  No symptoms of severe aortic stenosis at this time.  Activity limitations are more from his back. 2. Anticoagulated: Has had some rectal bleeding related to hemorrhoids and some type of tear.  He is followed by GI.  Symptoms have been under control. 3. Elevated BP: Blood pressure well controlled today.  Continue current medications. 4. Varicose veins/LE edema: Using Lasix as needed.  Elevate legs as well.  No further follow-up at this time with vascular surgery is scheduled. 5. We will schedule fasting labs.  We need to recheck his cholesterol.  Continue low-dose rosuvastatin at this time.   Current medicines are reviewed at length with the patient today.  The patient concerns regarding his medicines were addressed.  The following changes have been made:  No change  Labs/ tests ordered today include:   Orders Placed  This Encounter  Procedures  . Comprehensive metabolic panel  . Lipid panel    Recommend 150 minutes/week of aerobic exercise Low fat, low carb, high fiber diet recommended  Disposition:   FU in 6 months   Signed, Larae Grooms, MD  06/19/2017 10:55 AM    [facility] [address], [place], [place]  [address] Phone: [phone]; Fax: [phone]

## 2017-06-19 ENCOUNTER — Ambulatory Visit: Payer: 59 | Admitting: Interventional Cardiology

## 2017-06-19 ENCOUNTER — Encounter: Payer: Self-pay | Admitting: Interventional Cardiology

## 2017-06-19 ENCOUNTER — Ambulatory Visit (HOSPITAL_COMMUNITY): Payer: 59 | Attending: Cardiovascular Disease

## 2017-06-19 ENCOUNTER — Other Ambulatory Visit: Payer: Self-pay

## 2017-06-19 VITALS — BP 128/68 | HR 65 | Ht 72.0 in | Wt 258.0 lb

## 2017-06-19 DIAGNOSIS — I350 Nonrheumatic aortic (valve) stenosis: Secondary | ICD-10-CM

## 2017-06-19 DIAGNOSIS — Z952 Presence of prosthetic heart valve: Secondary | ICD-10-CM

## 2017-06-19 DIAGNOSIS — Z8249 Family history of ischemic heart disease and other diseases of the circulatory system: Secondary | ICD-10-CM | POA: Diagnosis not present

## 2017-06-19 DIAGNOSIS — G4733 Obstructive sleep apnea (adult) (pediatric): Secondary | ICD-10-CM | POA: Insufficient documentation

## 2017-06-19 DIAGNOSIS — R0600 Dyspnea, unspecified: Secondary | ICD-10-CM | POA: Insufficient documentation

## 2017-06-19 DIAGNOSIS — R600 Localized edema: Secondary | ICD-10-CM | POA: Diagnosis not present

## 2017-06-19 DIAGNOSIS — I351 Nonrheumatic aortic (valve) insufficiency: Secondary | ICD-10-CM | POA: Diagnosis not present

## 2017-06-19 DIAGNOSIS — Z7901 Long term (current) use of anticoagulants: Secondary | ICD-10-CM | POA: Diagnosis not present

## 2017-06-19 DIAGNOSIS — E785 Hyperlipidemia, unspecified: Secondary | ICD-10-CM | POA: Diagnosis not present

## 2017-06-19 NOTE — Patient Instructions (Addendum)
Medication Instructions:  Your physician recommends that you continue on your current medications as directed. Please refer to the Current Medication list given to you today.   Labwork: Your physician recommends that you return for FASTING lab work: CMET, LIPIDS   Testing/Procedures: None ordered  Follow-Up: Your physician wants you to follow-up in: 6 months with Dr. Irish Lack. You will receive a reminder letter in the mail two months in advance. If you don't receive a letter, please call our office to schedule the follow-up appointment.   Any Other Special Instructions Will Be Listed Below (If Applicable).     If you need a refill on your cardiac medications before your next appointment, please call your pharmacy.

## 2017-06-20 ENCOUNTER — Other Ambulatory Visit: Payer: Self-pay | Admitting: Emergency Medicine

## 2017-06-23 ENCOUNTER — Other Ambulatory Visit: Payer: 59 | Admitting: *Deleted

## 2017-06-23 DIAGNOSIS — E785 Hyperlipidemia, unspecified: Secondary | ICD-10-CM

## 2017-06-23 LAB — COMPREHENSIVE METABOLIC PANEL
A/G RATIO: 2 (ref 1.2–2.2)
ALT: 27 IU/L (ref 0–44)
AST: 33 IU/L (ref 0–40)
Albumin: 4.3 g/dL (ref 3.6–4.8)
Alkaline Phosphatase: 87 IU/L (ref 39–117)
BUN/Creatinine Ratio: 19 (ref 10–24)
BUN: 20 mg/dL (ref 8–27)
Bilirubin Total: 0.5 mg/dL (ref 0.0–1.2)
CALCIUM: 8.9 mg/dL (ref 8.6–10.2)
CO2: 22 mmol/L (ref 20–29)
CREATININE: 1.03 mg/dL (ref 0.76–1.27)
Chloride: 106 mmol/L (ref 96–106)
GFR, EST AFRICAN AMERICAN: 90 mL/min/{1.73_m2} (ref >59)
GFR, EST NON AFRICAN AMERICAN: 77 mL/min/{1.73_m2} (ref >59)
Globulin, Total: 2.2 g/dL (ref 1.5–4.5)
Glucose: 102 mg/dL — ABNORMAL HIGH (ref 65–99)
POTASSIUM: 4.5 mmol/L (ref 3.5–5.2)
Sodium: 140 mmol/L (ref 134–144)
TOTAL PROTEIN: 6.5 g/dL (ref 6.0–8.5)

## 2017-06-23 LAB — LIPID PANEL
CHOL/HDL RATIO: 2.8 ratio (ref 0.0–5.0)
Cholesterol, Total: 110 mg/dL (ref 100–199)
HDL: 39 mg/dL — AB (ref >39)
LDL CALC: 50 mg/dL (ref 0–99)
Triglycerides: 105 mg/dL (ref 0–149)
VLDL CHOLESTEROL CAL: 21 mg/dL (ref 5–40)

## 2017-07-03 ENCOUNTER — Telehealth: Payer: Self-pay

## 2017-07-03 DIAGNOSIS — Z952 Presence of prosthetic heart valve: Secondary | ICD-10-CM

## 2017-07-03 NOTE — Telephone Encounter (Signed)
-----   Message from [name], MD sent at 06/19/2017  4:44 PM EST ----- Normal LV function.  Moderate aortic stenosis. Mild to moderate AI.

## 2017-07-03 NOTE — Telephone Encounter (Signed)
Notes recorded by Drue Novel I, RN on 07/03/2017 at 1:32 PM EST Discussed with Dr. Irish Lack and will repeat echo in 6 months. Patient aware. Echo ordered for 6 months. ------  Notes recorded by Cleon Gustin, RN on 06/19/2017 at 5:37 PM EST Patient made aware of results. Patient verbalizes understanding.

## 2017-07-04 DIAGNOSIS — Z8619 Personal history of other infectious and parasitic diseases: Secondary | ICD-10-CM

## 2017-07-04 HISTORY — DX: Personal history of other infectious and parasitic diseases: Z86.19

## 2017-08-22 ENCOUNTER — Other Ambulatory Visit: Payer: Self-pay | Admitting: Emergency Medicine

## 2017-08-29 ENCOUNTER — Ambulatory Visit: Payer: 59 | Admitting: Emergency Medicine

## 2017-08-29 ENCOUNTER — Encounter: Payer: Self-pay | Admitting: Emergency Medicine

## 2017-08-29 DIAGNOSIS — G4733 Obstructive sleep apnea (adult) (pediatric): Secondary | ICD-10-CM

## 2017-08-29 DIAGNOSIS — J4540 Moderate persistent asthma, uncomplicated: Secondary | ICD-10-CM

## 2017-08-29 NOTE — Assessment & Plan Note (Signed)
Increased dyspnea, question some relationship to his weight.  He is also having more cough and more albuterol use.  Could reflect worsening control of his asthma.  Please continue Symbicort 2 puffs twice a day.  Remember to rinse and gargle after taking this medication. Keep albuterol available to use 2 puffs if needed for shortness of breath, wheezing, chest tightness Stop lisinopril now Start valsartan 160 mg once a day. Continue to track your peak flows 3 or 4 times weekly. Keep track of your cough and your albuterol use so that we can discuss next time. Continue Claritin and Prilosec as you have been taking them.  We may decide to modify these medications as we go forward if he believes that it will help with asthma control. Follow with Dr Lamonte Sakai in 2 months or sooner if you have any problems.

## 2017-08-29 NOTE — Progress Notes (Signed)
Subjective:    Patient ID: Jeffery Whitehead, male    DOB: June 08, 1956, 62 y.o.   MRN: [id number]  Shortness of Breath  Pertinent negatives include no ear pain, fever, headaches, leg swelling, rash, rhinorrhea, sore throat, vomiting or wheezing. His past medical history is significant for asthma.  Asthma  He complains of shortness of breath. There is no cough or wheezing. Pertinent negatives include no ear pain, fever, headaches, postnasal drip, rhinorrhea, sneezing, sore throat or trouble swallowing. His past medical history is significant for asthma.   ROV 08/29/17 --62 year old man, never smoker, with a history of moderate obstruction by pulmonary function testing.  I followed him for this and also for obstructive sleep apnea.  He has been trialed on Symbicort in the past but unclear that this really helped him.  He remains on Symbicort. Good CPAP compliance. He feels that it helps him - sleeps better. Nasal mask. The machine is in good repair, has the right components. No longer using chinstrap.  Download information 1/27 to 08/28/17 shows 93% of the nights he uses his CPAP for greater than 4 hours.  He is on an AutoSet range between 5 and 15, is mean pressure is 9-10.  Good control of his obstructive events.  He notes that he has had some exertional dyspnea starting about 2 weeks ago. He has gained about 15 lbs over the last year. Exercise tolerance has decreased. He is coughing more, non-productive. He is on lisinopril since last fall. He is using a flow meter - averaging a little less, high 400's    Review of Systems  Constitutional: Negative for fever and unexpected weight change.  HENT: Negative for congestion, dental problem, ear pain, nosebleeds, postnasal drip, rhinorrhea, sinus pressure, sneezing, sore throat and trouble swallowing.   Eyes: Negative for redness and itching.  Respiratory: Positive for shortness of breath. Negative for cough, chest tightness and wheezing.   Cardiovascular:  Negative for palpitations and leg swelling.  Gastrointestinal: Negative for nausea and vomiting.  Genitourinary: Negative for dysuria.  Musculoskeletal: Negative for joint swelling.  Skin: Negative for rash.  Neurological: Negative for headaches.  Hematological: Does not bruise/bleed easily.  Psychiatric/Behavioral: Negative for dysphoric mood. The patient is not nervous/anxious.        Objective:   Physical Exam  Vitals:   08/29/17 0948  BP: 130/72  Pulse: 69  SpO2: 98%  Weight: 265 lb (120.2 kg)  Height: 6' (1.829 m)   obese, in no distress,  normal affect  ENT: No lesions,  mouth clear,  oropharynx clear, no postnasal drip  Neck: No JVD, no stridor  Lungs: No use of accessory muscles, clear without rales or rhonchi  Cardiovascular: RRR, heart sounds normal, no murmur or gallops, no peripheral edema  Musculoskeletal: No deformities, no cyanosis or clubbing  Neuro: alert, non focal  Skin: Warm, no lesions or rashes     Assessment & Plan:  OSA (obstructive sleep apnea) Documented good compliance.  He is clinically benefiting.  We will continue same CPAP settings (AutoSet 5-15 cmH2O)  Asthma, chronic, moderate persistent, uncomplicated Increased dyspnea, question some relationship to his weight.  He is also having more cough and more albuterol use.  Could reflect worsening control of his asthma.  Please continue Symbicort 2 puffs twice a day.  Remember to rinse and gargle after taking this medication. Keep albuterol available to use 2 puffs if needed for shortness of breath, wheezing, chest tightness Stop lisinopril now Start valsartan 160 mg once a day.  Continue to track your peak flows 3 or 4 times weekly. Keep track of your cough and your albuterol use so that we can discuss next time. Continue Claritin and Prilosec as you have been taking them.  We may decide to modify these medications as we go forward if he believes that it will help with asthma  control. Follow with Dr Lamonte Sakai in 2 months or sooner if you have any problems.  Baltazar Apo, MD, PhD 08/29/2017, 10:15 AM [facility] [phone] or if no answer [phone]

## 2017-08-29 NOTE — Patient Instructions (Addendum)
Please continue Symbicort 2 puffs twice a day.  Remember to rinse and gargle after taking this medication. Keep albuterol available to use 2 puffs if needed for shortness of breath, wheezing, chest tightness Stop lisinopril now Start valsartan 160 mg once a day. Continue to track your peak flows 3 or 4 times weekly. Keep track of your cough and your albuterol use so that we can discuss next time. Continue your CPAP as you have been wearing it. Continue Claritin and Prilosec as you have been taking them.  We may decide to modify these medications as we go forward if he believes that it will help with asthma control. Follow with Dr Lamonte Sakai in 2 months or sooner if you have any problems.

## 2017-08-29 NOTE — Assessment & Plan Note (Signed)
Documented good compliance.  He is clinically benefiting.  We will continue same CPAP settings (AutoSet 5-15 cmH2O)

## 2017-08-30 ENCOUNTER — Telehealth: Payer: Self-pay | Admitting: Emergency Medicine

## 2017-08-30 MED ORDER — VALSARTAN 160 MG PO TABS: 160.0000 mg | ORAL_TABLET | Freq: Every day | ORAL | 5 refills | Status: DC

## 2017-08-30 NOTE — Telephone Encounter (Signed)
Called and spoke with pt letting him know per his OV with RB 08/29/17, RB was stopping the lisinopril and starting valsartan 160mg .  Pt stated he called his pharmacy and they had not received an Rx on the new medication for pt.  Stated to pt I would take care of calling Rx in for him to his preferred pharmacy.  Script of valsartan 160mg  sent to preferred pharmacy. Nothing further needed at this current time.

## 2017-10-04 ENCOUNTER — Encounter: Payer: Self-pay | Admitting: Interventional Cardiology

## 2017-10-10 ENCOUNTER — Encounter: Payer: Self-pay | Admitting: Emergency Medicine

## 2017-10-10 ENCOUNTER — Ambulatory Visit: Payer: 59 | Admitting: Emergency Medicine

## 2017-10-10 DIAGNOSIS — G4733 Obstructive sleep apnea (adult) (pediatric): Secondary | ICD-10-CM

## 2017-10-10 DIAGNOSIS — J4540 Moderate persistent asthma, uncomplicated: Secondary | ICD-10-CM | POA: Diagnosis not present

## 2017-10-10 DIAGNOSIS — R05 Cough: Secondary | ICD-10-CM | POA: Diagnosis not present

## 2017-10-10 DIAGNOSIS — R059 Cough, unspecified: Secondary | ICD-10-CM

## 2017-10-10 NOTE — Assessment & Plan Note (Signed)
Please continue your symbicort as you are using it. Remember to rinse and gargle after using. Keep albuterol available to use 2 puffs if needed for shortness of breath, wheezing, chest tightness.

## 2017-10-10 NOTE — Progress Notes (Signed)
Subjective:    Patient ID: Jeffery Whitehead, male    DOB: September 06, 1955, 62 y.o.   MRN: [id number]  Asthma  He complains of shortness of breath. There is no cough or wheezing. Pertinent negatives include no ear pain, fever, headaches, postnasal drip, rhinorrhea, sneezing, sore throat or trouble swallowing. His past medical history is significant for asthma.  Shortness of Breath  Pertinent negatives include no ear pain, fever, headaches, leg swelling, rash, rhinorrhea, sore throat, vomiting or wheezing. His past medical history is significant for asthma.   ROV 08/29/17 --62 year old man, never smoker, with a history of moderate obstruction by pulmonary function testing.  I followed him for this and also for obstructive sleep apnea.  He has been trialed on Symbicort in the past but unclear that this really helped him.  He remains on Symbicort. Good CPAP compliance. He feels that it helps him - sleeps better. Nasal mask. The machine is in good repair, has the right components. No longer using chinstrap.  Download information 1/27 to 08/28/17 shows 93% of the nights he uses his CPAP for greater than 4 hours.  He is on an AutoSet range between 5 and 15, is mean pressure is 9-10.  Good control of his obstructive events.  He notes that he has had some exertional dyspnea starting about 2 weeks ago. He has gained about 15 lbs over the last year. Exercise tolerance has decreased. He is coughing more, non-productive. He is on lisinopril since last fall. He is using a flow meter - averaging a little less, high 400's  ROV 10/10/17 --patient follows up today for history of obstructive sleep apnea, asthma and a never smoker.  At our last visit he was having more cough.  We decided to change his lisinopril to an ARB.  He is on omeprazole 20 mg daily, not currently on an allergy regimen. He is on symbicort, still has some exercise dyspnea. Uses albuterol about few times a week. He is on benadryl right now, off loratadine.    Since last time he has been dx with an inguinal hernia, also now has developed shingles.   CPAP download information available between 3/10 and 10/09/17.  He used the device for greater than 4 hours 80% of the nights with good event control on an AutoSet mode between 5 and 15 cmH2O    Review of Systems  Constitutional: Negative for fever and unexpected weight change.  HENT: Negative for congestion, dental problem, ear pain, nosebleeds, postnasal drip, rhinorrhea, sinus pressure, sneezing, sore throat and trouble swallowing.   Eyes: Negative for redness and itching.  Respiratory: Positive for shortness of breath. Negative for cough, chest tightness and wheezing.   Cardiovascular: Negative for palpitations and leg swelling.  Gastrointestinal: Negative for nausea and vomiting.  Genitourinary: Negative for dysuria.  Musculoskeletal: Negative for joint swelling.  Skin: Negative for rash.  Neurological: Negative for headaches.  Hematological: Does not bruise/bleed easily.  Psychiatric/Behavioral: Negative for dysphoric mood. The patient is not nervous/anxious.        Objective:   Physical Exam  Vitals:   10/10/17 0940  BP: 132/84  Pulse: 80  SpO2: 97%  Weight: 263 lb (119.3 kg)  Height: 6' (1.829 m)   obese, in no distress,  normal affect  ENT: No lesions,  mouth clear,  oropharynx clear, no postnasal drip  Neck: No JVD, no stridor  Lungs: No use of accessory muscles, clear without rales or rhonchi  Cardiovascular: RRR, heart sounds normal, no murmur or gallops,  no peripheral edema  Musculoskeletal: No deformities, no cyanosis or clubbing  Neuro: alert, non focal  Skin: Warm, no lesions or rashes     Assessment & Plan:  OSA (obstructive sleep apnea) Continue your CPAP every night.  Let us know if you have issues with equipment. Follow with Dr Lamonte Sakai in 12 months or sooner if you have any problems  Asthma, chronic, moderate persistent, uncomplicated Please continue  your symbicort as you are using it. Remember to rinse and gargle after using. Keep albuterol available to use 2 puffs if needed for shortness of breath, wheezing, chest tightness.   Cough Improved.  Components of allergic rhinitis, GERD.  He also benefited from changing his ACE inhibitor to valsartan.  We will continue this.  Baltazar Apo, MD, PhD 10/10/2017, 10:00 AM  [facility] [phone] or if no answer [phone]

## 2017-10-10 NOTE — Patient Instructions (Addendum)
Please continue your symbicort as you are using it. Remember to rinse and gargle after using. Keep albuterol available to use 2 puffs if needed for shortness of breath, wheezing, chest tightness. Agree with using Benadryl, loratadine, or Zyrtec to manage her allergy symptoms. Continue omeprazole 20 mg daily. We will continue your valsartan as ordered. Continue your CPAP every night.  Let us know if you have issues with equipment. Follow with Dr Lamonte Sakai in 12 months or sooner if you have any problems

## 2017-10-10 NOTE — Assessment & Plan Note (Signed)
Improved.  Components of allergic rhinitis, GERD.  He also benefited from changing his ACE inhibitor to valsartan.  We will continue this.

## 2017-10-10 NOTE — Assessment & Plan Note (Signed)
Continue your CPAP every night.  Let us know if you have issues with equipment. Follow with Dr Lamonte Sakai in 12 months or sooner if you have any problems

## 2017-12-06 ENCOUNTER — Other Ambulatory Visit: Payer: Self-pay

## 2017-12-06 ENCOUNTER — Ambulatory Visit (HOSPITAL_COMMUNITY): Payer: 59 | Attending: Cardiovascular Disease

## 2017-12-06 DIAGNOSIS — Z952 Presence of prosthetic heart valve: Secondary | ICD-10-CM

## 2017-12-06 DIAGNOSIS — Z48812 Encounter for surgical aftercare following surgery on the circulatory system: Secondary | ICD-10-CM | POA: Insufficient documentation

## 2017-12-06 DIAGNOSIS — I371 Nonrheumatic pulmonary valve insufficiency: Secondary | ICD-10-CM | POA: Diagnosis not present

## 2017-12-06 DIAGNOSIS — R0600 Dyspnea, unspecified: Secondary | ICD-10-CM | POA: Diagnosis not present

## 2017-12-06 DIAGNOSIS — G4733 Obstructive sleep apnea (adult) (pediatric): Secondary | ICD-10-CM | POA: Insufficient documentation

## 2017-12-06 DIAGNOSIS — Z95811 Presence of heart assist device: Secondary | ICD-10-CM | POA: Insufficient documentation

## 2017-12-06 DIAGNOSIS — I081 Rheumatic disorders of both mitral and tricuspid valves: Secondary | ICD-10-CM | POA: Insufficient documentation

## 2017-12-06 DIAGNOSIS — Z8249 Family history of ischemic heart disease and other diseases of the circulatory system: Secondary | ICD-10-CM | POA: Insufficient documentation

## 2017-12-06 DIAGNOSIS — I358 Other nonrheumatic aortic valve disorders: Secondary | ICD-10-CM | POA: Diagnosis present

## 2017-12-15 ENCOUNTER — Other Ambulatory Visit: Payer: Self-pay | Admitting: Emergency Medicine

## 2017-12-18 ENCOUNTER — Other Ambulatory Visit: Payer: Self-pay | Admitting: *Deleted

## 2017-12-18 MED ORDER — BUDESONIDE-FORMOTEROL FUMARATE 160-4.5 MCG/ACT IN AERO: 2.0000 | INHALATION_SPRAY | Freq: Two times a day (BID) | RESPIRATORY_TRACT | 5 refills | Status: DC

## 2017-12-21 ENCOUNTER — Encounter: Payer: Self-pay | Admitting: Interventional Cardiology

## 2017-12-21 ENCOUNTER — Ambulatory Visit: Payer: 59 | Admitting: Interventional Cardiology

## 2017-12-21 VITALS — BP 126/78 | HR 64 | Ht 72.0 in | Wt 259.0 lb

## 2017-12-21 DIAGNOSIS — I83899 Varicose veins of unspecified lower extremities with other complications: Secondary | ICD-10-CM

## 2017-12-21 DIAGNOSIS — Z7901 Long term (current) use of anticoagulants: Secondary | ICD-10-CM

## 2017-12-21 DIAGNOSIS — R600 Localized edema: Secondary | ICD-10-CM | POA: Diagnosis not present

## 2017-12-21 DIAGNOSIS — Z952 Presence of prosthetic heart valve: Secondary | ICD-10-CM | POA: Diagnosis not present

## 2017-12-21 NOTE — Patient Instructions (Signed)

## 2017-12-21 NOTE — Progress Notes (Signed)
Cardiology Office Note   Date:  12/21/2017   ID:  Jeffery Whitehead, DOB 10/21/1955, MRN [id number]  PCP:  Nickola Major, MD    No chief complaint on file.  AVR  Wt Readings from Last 3 Encounters:  12/21/17 259 lb (117.5 kg)  10/10/17 263 lb (119.3 kg)  08/29/17 265 lb (120.2 kg)       History of Present Illness: Jeffery Whitehead is a 62 y.o. male   who had an AVR in 26 at age 51, with a St Jude Valve; model number 25A-101; serial number [id number]; blood type A+. He has been on Coumadin since that time. He moved here from [place] in 2015 for his job at [employer]. He had a bicuspid aortic valve that was replaced. Only bleeding issue has been occasional bloody nose. He has had several cauterizations.   In 2015, he noticed some SHOB after he had a cold that has lasted a few weeks. He does have chronic dyspnea on exertion which has started since he gained weight. Echo at that time showed normal LV function.   He had some vein removal in the left leg with Dr. Kellie Simmering in 2015. THis went well. No leg swelling. No bleeding issues. THere are some veins in the right that are being watched.  He admitted and found to have MSSA bacteremia and endocarditis. From the discharge summary in 2/18: Sepsis (due to MSSA infection). -Presented with temperature of 101.3, respiratory rate of 33 and presence of bacteremia. -Patient given IV fluids, blood pressure was soft on admission. -Sepsis physiology resolved, continue current antibiotics. Patient on ancef and gentamicin for MSSA infection. Appears to be secondary to Lumbar discitis/endocarditis   MSSA bacteremia with endocarditis  -presumed to be secondary to Lumbar discitis  -TEE with small vegetation seen -discussed with ID; planning 6 weeks of IV Ancef and 2 weeks of IV gentamicin  -PICC line in place now. -will be follow on 09/27/16 at ID offices; keep line in place until evaluated by ID physician   Acute  encephalopathy -Likely acute metabolic encephalopathy secondary to sepsis. -This is resolved patient is back to his baseline.  He had an echo in May 2018 showing normal LV function with moderate AI and moderate to severe AS.   Occasional blood in stool.  He has had issues with hemmorhoids.   He was to have another back procedure in December 2018 that was postponed by snow. Heeventually had a back procedure and has had some relief.  Denies : Chest pain. Dizziness. Leg edema. Nitroglycerin use. Orthopnea. Palpitations. Paroxysmal nocturnal dyspnea. Shortness of breath. Syncope.   Exercise limited by his back problems.   Echo in June 2019: - Left ventricle: The cavity size was mildly dilated. Wall   thickness was normal. Systolic function was normal. The estimated   ejection fraction was in the range of 55% to 60%. Left   ventricular diastolic function parameters were normal. - Aortic valve: Mechanical ? St Jude AVR with stable but elevated   gradients similar to 06/2017. DVI abnormal .24 along with mild   central AR suggests   some valve dysfunction Valve area (VTI): 0.9 cm^2. Valve area   (Vmax): 0.8 cm^2. Valve area (Vmean): 0.9 cm^2. - Mitral valve: There was mild regurgitation. - Left atrium: The atrium was moderately dilated. - Atrial septum: No defect or patent foramen ovale was identified.   Past Medical History:  Diagnosis Date  . Cold   . Decreased anal sphincter tone   .  Heart valve replaced    1983   st jude  . Insomnia   . Shortness of breath dyspnea   . Sleep apnea    cpap  . Varicose vein of leg   . Varicose veins     Past Surgical History:  Procedure Laterality Date  . AORTIC VALVE REPLACEMENT  1983  . FLUOROSCOPY GUIDANCE N/A 08/24/2016   Procedure: Fluoroscopy Guidance;  Surgeon: Leonie Man, MD;  Location: [facility];  Service: Cardiovascular;  Laterality: N/A;  . TEE WITHOUT CARDIOVERSION N/A 08/24/2016   Procedure: TRANSESOPHAGEAL  ECHOCARDIOGRAM (TEE);  Surgeon: Pixie Casino, MD;  Location: [facility] END[facility]COPY;  Service: Cardiovascular;  Laterality: N/A;  . TONSILLECTOMY    . VEIN LIGATION AND STRIPPING Left 08/15/2014   Procedure: SAPHENOUS VEIN LIGATION AND EXCISION OF VARICOSITIES;  Surgeon: Mal Misty, MD;  Location: [facility];  Service: Vascular;  Laterality: Left;     Current Outpatient Medications  Medication Sig Dispense Refill  . acetaminophen (TYLENOL) 325 MG tablet Take 2 tablets (650 mg total) by mouth every 6 (six) hours as needed for mild pain, fever or headache (or Fever >/= 101).    Marland Kitchen albuterol (PROVENTIL HFA;VENTOLIN HFA) 108 (90 Base) MCG/ACT inhaler Inhale 2 puffs into the lungs every 6 (six) hours as needed for wheezing or shortness of breath. 1 Inhaler 5  . amoxicillin (AMOXIL) 500 MG capsule Take 4 capsules by mouth. Prior to dental procedures  2  . budesonide-formoterol (SYMBICORT) 160-4.5 MCG/ACT inhaler Inhale 2 puffs into the lungs 2 (two) times daily. 10.2 Inhaler 5  . furosemide (LASIX) 40 MG tablet Take 40 mg by mouth daily as needed for edema.    . lidocaine (XYLOCAINE) 5 % ointment Apply 1 application topically 3 (three) times daily as needed.    Marland Kitchen LINZESS 72 MCG capsule Take 72 mcg by mouth daily.  4  . Multiple Vitamins-Minerals (CENTRUM SILVER ADULT 50+ PO) Take 1 tablet by mouth daily with breakfast.    . omeprazole (PRILOSEC) 20 MG capsule Take 1 capsule by mouth daily.    . potassium chloride SA (K-DUR,KLOR-CON) 20 MEQ tablet Take 1 tablet (20 mEq total) by mouth daily as needed. TAKE ONLY if taking furosemide (lasix) 30 tablet 11  . PREVIDENT 5000 BOOSTER PLUS 1.1 % PSTE USE A THIN RIBBON OF PASTE ON TOOTHBRUSH NIGHTLY BEFORE BED. DO NOT RINSE  6  . rosuvastatin (CRESTOR) 5 MG tablet Take 5 mg by mouth.    . valsartan (DIOVAN) 160 MG tablet Take 1 tablet (160 mg total) by mouth daily. 30 tablet 5  . warfarin (COUMADIN) 7.5 MG tablet Take 7.5 mg by mouth daily. 7.5mg  Sunday, Tuesday,  Thursday and Saturday. 3.5mg  other days.  1  . zolpidem (AMBIEN) 10 MG tablet Take 10 mg by mouth at bedtime.      No current facility-administered medications for this visit.     Allergies:   Patient has no known allergies.    Social History:  The patient  reports that he has never smoked. He has never used smokeless tobacco. He reports that he drinks alcohol. He reports that he does not use drugs.   Family History:  The patient's family history includes Heart disease in his father.    ROS:  Please see the history of present illness.   Otherwise, review of systems are positive for wakes up early due to back pain.   All other systems are reviewed and negative.    PHYSICAL EXAM:  VS:  BP 126/78   Pulse 64   Ht 6' (1.829 m)   Wt 259 lb (117.5 kg)   SpO2 97%   BMI 35.13 kg/m  , BMI Body mass index is 35.13 kg/m. GEN: Well nourished, well developed, in no acute distress  HEENT: normal  Neck: no JVD, carotid bruits, or masses Cardiac: RRR; 3/6 systolic murmur, norubs, or gallops,no edema ; crisp S2 click Respiratory:  clear to auscultation bilaterally, normal work of breathing GI: soft, nontender, nondistended, + BS MS: no deformity or atrophy  Skin: warm and dry, discoloration at both ankles- varicose veins Neuro:  Strength and sensation are intact Psych: euthymic mood, full affect  ECG: NSR, inferior and lateral T wave inversion; no significant change from prior  Recent Labs: 06/23/2017: ALT 27; BUN 20; Creatinine, Ser 1.03; Potassium 4.5; Sodium 140   Lipid Panel    Component Value Date/Time   CHOL 110 06/23/2017 0904   TRIG 105 06/23/2017 0904   HDL 39 (L) 06/23/2017 0904   CHOLHDL 2.8 06/23/2017 0904   CHOLHDL 3.7 03/08/2016 0837   VLDL 31 (H) 03/08/2016 0837   LDLCALC 50 06/23/2017 0904     Other studies Reviewed: Additional studies/ records that were reviewed today with results demonstrating: Hbg 11.3, Cr 1.03, LFTs stable, LDL 68 in 3/19.   ASSESSMENT AND  PLAN:  1. S/p AVR: moderate AI and moderate to severe AS:  S2 click intact.  EF preserved in June 2019.  Echo result discussed.  No sx of severe AS.  Calculated valve area higher than prior echo in 12/18. 2. Anticoagulated: Last INR was in May, 3.3. TO be checked Tuesday of next week. 3. Elevated BP: BP controlled today. 4. Varicose veins/leg edema: Edema stable.  Managed with diuretics.  He has decreased his diuretics so would recommend that he have electrolytes checked to see if potassium is still needed. Bloodwork with PMD is scheduled for next week.   Current medicines are reviewed at length with the patient today.  The patient concerns regarding his medicines were addressed.  The following changes have been made:  No change  Labs/ tests ordered today include:  No orders of the defined types were placed in this encounter.   Recommend 150 minutes/week of aerobic exercise Low fat, low carb, high fiber diet recommended  Disposition:   FU in 6 months   Signed, Larae Grooms, MD  12/21/2017 8:22 AM    [facility] [address], [place], [place]  [address] Phone: [phone]; Fax: [phone]

## 2018-02-16 ENCOUNTER — Other Ambulatory Visit: Payer: Self-pay | Admitting: Emergency Medicine

## 2018-03-15 ENCOUNTER — Other Ambulatory Visit: Payer: Self-pay | Admitting: Emergency Medicine

## 2018-04-08 ENCOUNTER — Other Ambulatory Visit: Payer: Self-pay | Admitting: Emergency Medicine

## 2018-04-17 ENCOUNTER — Ambulatory Visit: Payer: Self-pay | Admitting: Surgery

## 2018-04-17 ENCOUNTER — Telehealth: Payer: Self-pay

## 2018-04-17 ENCOUNTER — Encounter: Payer: Self-pay | Admitting: Surgery

## 2018-04-17 DIAGNOSIS — K429 Umbilical hernia without obstruction or gangrene: Secondary | ICD-10-CM | POA: Insufficient documentation

## 2018-04-17 NOTE — H&P (Signed)
[place] Documented: 04/17/2018 9:28 AM Location: [facility] Patient #: [id number] DOB: 1956-01-13 Married / Language: English / Race: White Male  History of Present Illness Adin Hector MD; 04/17/2018 9:57 AM) The patient is a 62 year old male who presents with an umbilical hernia. Note for "Umbilical hernia": ` ` ` Patient sent for surgical consultation at the request of Dr. Sharyon Medicus  Chief Complaint: Umbilical hernia  The patient is a pleasant male whose wife noticed a lump at his belly button. Recommended he discuss with primary care office. Patient does not know how long he's had that there. Has not gotten larger. Not particularly uncomfortable. No pain. He's never had any abdominal surgery. Surgical consultation requested. I saw him in April. Because it was a small umbilical hernia, I did not strongly push for surgical repair. He wish to hold off.  However, he's noticed that it is got larger. Much more sensitive and bothersome now. Cindy's worse when he was up in [place] for vacation this past summer. Pain did not go away. Now several months. Notice it when he gets up and turned to twists or sneezes or coughs. Very sensitive to light touch and bumping up again at student. Wishes to reconsider surgical repair. tient has a history of aortic valve replacement in his 109s. Warfarin. No major bleeding or other issues. No history of infection. Usually moves his bowels about 3 times a day. No real problem with urination or defecation. No history of fall or trauma. He does struggle with some severe back pain and does have some obesity. However those are again stable issues. Sounds like an episode of mild endocarditis over a year ago. No issues since. He does have a history of many subcutaneous lipomas requiring removal. He thinks he said 20 removed total. Not recently. He does have a few lumps in his upper abdomen and flanks. However those are small  and unchanging and do not seem to be bothersome he does have a history sleep apnea for which he uses CPAP machine. No major changes.  (Review of systems as stated in this history (HPI) or in the review of systems. Otherwise all other 12 point ROS are negative) ` ` `   Problem List/Past Medical Adin Hector, MD; 84/16/6063 0:16 AM) UMBILICAL HERNIA WITHOUT OBSTRUCTION AND WITHOUT GANGRENE (K42.9) OBESITY WITH SLEEP APNEA (G47.30) OSA (OBSTRUCTIVE SLEEP APNEA) (G47.33) PREOP - [place] - ENCOUNTER FOR PREOPERATIVE EXAMINATION FOR GENERAL SURGICAL PROCEDURE (Z01.818) ANTICOAGULATED (Z79.01)  Past Surgical History (Tanisha A. Owens Shark, [place]; 04/17/2018 9:29 AM) Tonsillectomy Valve Replacement  Diagnostic Studies History (Tanisha A. Owens Shark, [place]; 04/17/2018 9:29 AM) Colonoscopy 1-5 years ago  Allergies (Tanisha A. Owens Shark, [place]; 04/17/2018 9:29 AM) No Known Drug Allergies [10/02/2017]: Allergies Reconciled  Medication History (Tanisha A. Owens Shark, RMA; 04/17/2018 9:30 AM) Warfarin Sodium (7.5MG  Tablet, Oral) Active. Rosuvastatin Calcium (5MG  Tablet, Oral) Active. Symbicort (160-4.5MCG/ACT Aerosol, Inhalation) Active. Zolpidem Tartrate (10MG  Tablet, Oral) Active. ValACYclovir HCl (1GM Tablet, Oral) Active. Klor-Con M20 [facility] Tablet ER, Oral) Active. Methocarbamol (750MG  Tablet, Oral) Active. Medications Reconciled  Social History (Tanisha A. Owens Shark, RMA; 04/17/2018 9:29 AM) Alcohol use Moderate alcohol use. Caffeine use Carbonated beverages, Coffee, Tea. No drug use Tobacco use Never smoker.  Family History (Tanisha A. Owens Shark, [place]; 04/17/2018 9:29 AM) Cervical Cancer Mother. Colon Polyps Brother. Heart Disease Father. Melanoma Brother.  Other Problems Adin Hector, MD; 04/17/2018 9:55 AM) Asthma Back Pain Chronic Obstructive Lung Disease Gastroesophageal Reflux Disease Heart murmur Hemorrhoids High blood pressure Other  disease, cancer,  significant illness Sleep Apnea Umbilical Hernia Repair     Review of Systems (Tanisha A. Brown RMA; 04/17/2018 9:29 AM) General Present- Appetite Loss and Fatigue. Not Present- Chills, Fever, Night Sweats, Weight Gain and Weight Loss. Skin Present- Rash. Not Present- Change in Wart/Mole, Dryness, Hives, Jaundice, New Lesions, Non-Healing Wounds and Ulcer. HEENT Present- Hoarseness, Seasonal Allergies and Wears glasses/contact lenses. Not Present- Earache, Hearing Loss, Nose Bleed, Oral Ulcers, Ringing in the Ears, Sinus Pain, Sore Throat, Visual Disturbances and Yellow Eyes. Respiratory Present- Chronic Cough, Snoring and Wheezing. Not Present- Bloody sputum and Difficulty Breathing. Breast Not Present- Breast Mass, Breast Pain, Nipple Discharge and Skin Changes. Cardiovascular Present- Leg Cramps, Shortness of Breath and Swelling of Extremities. Not Present- Chest Pain, Difficulty Breathing Lying Down, Palpitations and Rapid Heart Rate. Gastrointestinal Present- Abdominal Pain. Not Present- Bloating, Bloody Stool, Change in Bowel Habits, Chronic diarrhea, Constipation, Difficulty Swallowing, Excessive gas, Gets full quickly at meals, Hemorrhoids, Indigestion, Nausea, Rectal Pain and Vomiting. Musculoskeletal Present- Back Pain, Joint Pain, Joint Stiffness, Muscle Pain, Muscle Weakness and Swelling of Extremities. Neurological Present- Weakness. Not Present- Decreased Memory, Fainting, Headaches, Numbness, Seizures, Tingling, Tremor and Trouble walking. Psychiatric Present- Depression. Not Present- Anxiety, Bipolar, Change in Sleep Pattern, Fearful and Frequent crying. Endocrine Not Present- Cold Intolerance, Excessive Hunger, Hair Changes, Heat Intolerance, Hot flashes and New Diabetes. Hematology Present- Blood Thinners. Not Present- Easy Bruising, Excessive bleeding, Gland problems, HIV and Persistent Infections.  Vitals (Tanisha A. Brown RMA; 04/17/2018 9:29 AM) 04/17/2018 9:29  AM Weight: 265.8 lb Height: 72in Body Surface Area: 2.4 m Body Mass Index: 36.05 kg/m  Temp.: 98.12F  Pulse: 73 (Regular)       Physical Exam Adin Hector MD; 04/17/2018 9:55 AM)  General Mental Status-Alert. General Appearance-Not in acute distress, Not Sickly. Orientation-Oriented X3. Hydration-Well hydrated. Voice-Normal.  Integumentary Global Assessment Upon inspection and palpation of skin surfaces of the - Axillae: non-tender, no inflammation or ulceration, no drainage. and Distribution of scalp and body hair is normal. General Characteristics Temperature - normal warmth is noted.  Head and Neck Head-normocephalic, atraumatic with no lesions or palpable masses. Face Global Assessment - atraumatic, no absence of expression. Neck Global Assessment - no abnormal movements, no bruit auscultated on the right, no bruit auscultated on the left, no decreased range of motion, non-tender. Trachea-midline. Thyroid Gland Characteristics - non-tender.  Eye Eyeball - Left-Extraocular movements intact, No Nystagmus. Eyeball - Right-Extraocular movements intact, No Nystagmus. Cornea - Left-No Hazy. Cornea - Right-No Hazy. Sclera/Conjunctiva - Left-No scleral icterus, No Discharge. Sclera/Conjunctiva - Right-No scleral icterus, No Discharge. Pupil - Left-Direct reaction to light normal. Pupil - Right-Direct reaction to light normal.  ENMT Ears Pinna - Left - no drainage observed, no generalized tenderness observed. Right - no drainage observed, no generalized tenderness observed. Nose and Sinuses External Inspection of the Nose - no destructive lesion observed. Inspection of the nares - Left - quiet respiration. Right - quiet respiration. Mouth and Throat Lips - Upper Lip - no fissures observed, no pallor noted. Lower Lip - no fissures observed, no pallor noted. Nasopharynx - no discharge present. Oral Cavity/Oropharynx - Tongue -  no dryness observed. Oral Mucosa - no cyanosis observed. Hypopharynx - no evidence of airway distress observed.  Chest and Lung Exam Inspection Movements - Normal and Symmetrical. Accessory muscles - No use of accessory muscles in breathing. Palpation Palpation of the chest reveals - Non-tender. Auscultation Breath sounds - Normal and Clear.  Cardiovascular Auscultation Rhythm -  Regular. Murmurs & Other Heart Sounds - Auscultation of the heart reveals - No Murmurs and No Systolic Clicks.  Abdomen Inspection Inspection of the abdomen reveals - No Visible peristalsis and No Abnormal pulsations. Umbilicus - No Bleeding, No Urine drainage. Palpation/Percussion Palpation and Percussion of the abdomen reveal - Soft, Non Tender, No Rebound tenderness, No Rigidity (guarding) and No Cutaneous hyperesthesia. Note: ` ` ` 25 mm mass reduces to 15 mm umbilical hernia. Very sensitive now but reducible. Moderate diastases recti    Abdomen morbidly obese but soft. Nontender. Not distended. No incisional hernias. No guarding. To-3 cm ellipsoid subcutaneous masses in epigastric abdomen consistent with lipomas. Doubt incisional hernias there.  Male Genitourinary Sexual Maturity Tanner 5 - Adult hair pattern and Adult penile size and shape.  Peripheral Vascular Upper Extremity Inspection - Left - No Cyanotic nailbeds, Not Ischemic. Right - No Cyanotic nailbeds, Not Ischemic.  Neurologic Neurologic evaluation reveals -normal attention span and ability to concentrate, able to name objects and repeat phrases. Appropriate fund of knowledge , normal sensation and normal coordination. Mental Status Affect - not angry, not paranoid. Cranial Nerves-Normal Bilaterally. Gait-Normal.  Neuropsychiatric Mental status exam performed with findings of-able to articulate well with normal speech/language, rate, volume and coherence, thought content normal with ability to perform basic  computations and apply abstract reasoning and no evidence of hallucinations, delusions, obsessions or homicidal/suicidal ideation.  Musculoskeletal Global Assessment Spine, Ribs and Pelvis - no instability, subluxation or laxity. Right Upper Extremity - no instability, subluxation or laxity. Note: Moderate thoracolumbar sensitivity. Moves quite slowly lying down. He prefers standup opacity hurts his back  Lymphatic Head & Neck  General Head & Neck Lymphatics: Bilateral - Description - No Localized lymphadenopathy. Axillary  General Axillary Region: Bilateral - Description - No Localized lymphadenopathy. Femoral & Inguinal  Generalized Femoral & Inguinal Lymphatics: Left - Description - No Localized lymphadenopathy. Right - Description - No Localized lymphadenopathy.    Assessment & Plan Adin Hector MD; 69/62/9528 4:13 AM)  UMBILICAL HERNIA WITHOUT OBSTRUCTION AND WITHOUT GANGRENE (K42.9) Impression: Not particularly large but very sensitive umbilical hernia in the setting of obesity and diastases recti.  Because it is more symptomatically, I think he would benefit from surgical repair. Would do underlay repair with mesh given his diastases recti and obesity. Lower recurrence chance.  He also has numerous subcutaneous lipomatous masses. Had many maroon in the past. He has a few his abdominal wall. Very large. Not symptomatic. Hold off on any further intervention on that.  Would like to get blessing from his cardiologist to make sure it safe to come off the warfarin. See if further workup is needed, although patient seen to be doing fine on the visit in June. Make sure there is no need for Lovenox bridge.  Patient does have sleep apnea but uses CPAP. Hopefully can be outpatient surgery. May have to watch overnight if anesthesia as concerns.   OBESITY WITH SLEEP APNEA (G47.30)   OSA (OBSTRUCTIVE SLEEP APNEA) (G47.33) Impression: Sleep apnea but uses nighttime CPAP well  controlled. That may require him to stay overnight versus allowing to go home since he has home CPAP and is well-controlled. We'll see what anesthesia thinks   PREOP - [place] - ENCOUNTER FOR PREOPERATIVE EXAMINATION FOR GENERAL SURGICAL PROCEDURE (Z01.818)  Current Plans You are being scheduled for surgery- Our schedulers will call you.  You should hear from our office's scheduling department within 5 working days about the location, date, and time of surgery. We  try to make accommodations for patient's preferences in scheduling surgery, but sometimes the OR schedule or the surgeon's schedule prevents [place] from making those accommodations.  If you have not heard from our office [phone] in 5 working days, call the office and ask for your surgeon's nurse.  If you have other questions about your diagnosis, plan, or surgery, call the office and ask for your surgeon's nurse.  Written instructions provided The anatomy & physiology of the abdominal wall was discussed. The pathophysiology of hernias was discussed. Natural history risks without surgery including progeressive enlargement, pain, incarceration, & strangulation was discussed. Contributors to complications such as smoking, obesity, diabetes, prior surgery, etc were discussed.  I feel the risks of no intervention will lead to serious problems that outweigh the operative risks; therefore, I recommended surgery to reduce and repair the hernia. I explained laparoscopic techniques with possible need for an open approach. I noted the probable use of mesh to patch and/or buttress the hernia repair  Risks such as bleeding, infection, abscess, need for further treatment, heart attack, death, and other risks were discussed. I noted a good likelihood this will help address the problem. Goals of post-operative recovery were discussed as well. Possibility that this will not correct all symptoms was explained. I stressed the importance of  low-impact activity, aggressive pain control, avoiding constipation, & not pushing through pain to minimize risk of post-operative chronic pain or injury. Possibility of reherniation especially with smoking, obesity, diabetes, immunosuppression, and other health conditions was discussed. We will work to minimize complications.  An educational handout further explaining the pathology & treatment options was given as well. Questions were answered. The patient expresses understanding & wishes to proceed with surgery.  Pt Education - [facility] Hernia Post-Op HCI (Carely Nappier): discussed with patient and provided information. Pt Education - [facility] Pain Control (Joy Haegele) Pt Education - Pamphlet Given - Laparoscopic Hernia Repair: discussed with patient and provided information. Pt Education - [facility] Mesh education: discussed with patient and provided information.  ANTICOAGULATED (Z79.01) Impression: Anticoagulated on warfarin after prosthetic aortic valve in his 34s.  We'll get input from his cardiologist, Dr. Irish Lack. Hold warfarin 5 days preop. Question will be this any further clearance and/or does he need a Lovenox bridge.  Current Plans I recommended obtaining preoperative cardiac clearance. I am concerned about the health of the patient and the ability to tolerate the operation. Therefore, we will request clearance by cardiology to better assess operative risk & see if a reevaluation, further workup, etc is needed. Also recommendations on how medications such as for anticoagulation and blood pressure should be managed/held/restarted after surgery. Pt Education - [facility] Hold anticoagulation preoperatively  Adin Hector, MD, FACS, MASCRS Gastrointestinal and Minimally Invasive Surgery    [address], [place], [place] [address] [phone] Main / Paging [phone] Fax

## 2018-04-17 NOTE — Telephone Encounter (Signed)
   [facility] Pre-operative Risk Assessment    Request for surgical clearance:  1. What type of surgery is being performed? Ventral hernia repair   2. When is this surgery scheduled? TBD   3. What type of clearance is required (medical clearance vs. Pharmacy clearance to hold med vs. Both)? Both  4. Are there any medications that need to be held prior to surgery and how long? Warfarin    5. Practice name and name of physician performing surgery? [facility]   6. What is your office phone number [phone]    7.   What is your office fax number [phone]   8.   Anesthesia type (None, local, MAC, general) ?              General

## 2018-04-20 NOTE — Telephone Encounter (Signed)
Pt takes warfarin for mechanical AVR. He does not have a history of stroke or afib. Ok to hold warfarin for 5 days prior to procedure. He does NOT require Lovenox bridge per our protocol. Please forward recommendation to physician managing warfarin as well for their records.

## 2018-04-23 NOTE — Telephone Encounter (Signed)
   Primary Cardiologist: Larae Grooms, MD  Chart reviewed as part of pre-operative protocol coverage. Patient was contacted 04/23/2018 in reference to pre-operative risk assessment for pending surgery as outlined below.  Jeffery Whitehead was last seen on 12/2017 by Dr. Irish Whitehead. H/o St Jude AVR in 1983, MSSA bacteremia, endocarditis, varicose veins. Last echo 12/2017 EF 55-60%, Dr. Irish Whitehead felt AV function was stable, mild central AI, mild MR. Since that day, Jeffery Whitehead has done well from cardiac standpoint with no new CP, SOB, palpitations or syncope. He is limited by chronic back issues but otherwise feels he is doing well. Revised cardiac risk index is 0.4% indicating low risk of cardiac complications. Therefore, based on ACC/AHA guidelines, the patient would be at acceptable risk for the planned procedure without further cardiovascular testing.   Per pharmD, "Pt takes warfarin for mechanical AVR. He does not have a history of stroke or afib. Ok to hold warfarin for 5 days prior to procedure. He does NOT require Lovenox bridge per our protocol. Please forward recommendation to physician managing warfarin as well for their records." The patient indicates this is Dr. Sharyon Whitehead and I have advised he call their team once a surgery date is scheduled. He reports he plans to have dental work before formally scheduling procedure and knows to take abx prophylaxis ahead of any and all dental procedures.  I will route this recommendation to the requesting party via Epic fax function and remove from pre-op pool.  Please call with questions.  Jeffery Pitter, PA-C 04/23/2018, 4:56 PM

## 2018-05-30 ENCOUNTER — Encounter (HOSPITAL_COMMUNITY): Payer: Self-pay

## 2018-05-30 NOTE — Patient Instructions (Signed)
Your procedure is scheduled on: Wednesday, Dec. 11, 2019   Surgery Time:  1:15PM-3:19PM   Report to [place]  Entrance    Report to admitting at  11:15 AM   Call this number if you have problems the morning of surgery [phone]   Bring CPAP mask and tubing day of surgery   Do not eat food:After Midnight.    May have liquids until 10:15AM morning of surgery  CLEAR LIQUID DIET  Foods Allowed                                                                     Foods Excluded  Coffee and tea, regular and decaf                             liquids that you cannot  Plain Jell-O in any flavor                                             see through such as: Fruit ices (not with fruit pulp)                                     milk, soups, orange juice  Iced Popsicles                                    All solid food Carbonated beverages, regular and diet                                    Cranberry, grape and apple juices Sports drinks like Gatorade Lightly seasoned clear broth or consume(fat free) Sugar, honey syrup  Sample Menu Breakfast                                Lunch                                     Supper Cranberry juice                    Beef broth                            Chicken broth Jell-O                                     Grape juice                           Apple juice Coffee or tea  Jell-O                                      Popsicle                                                Coffee or tea                        Coffee or tea   Drink pre surgery drink at 10:15AM morning of surgery   Brush your teeth the morning of surgery.   Do NOT smoke after Midnight   Take these medicines the morning of surgery with A SIP OF WATER: Omeprazole   Use Asthma Inhaler per routine   Use inhaler and nasal spray if needed                               You may not have any metal on your body including jewelry, and body  piercings             Do not wear lotions, powders, perfumes/cologne, or deodorant                           Men may shave face and neck.   Do not bring valuables to the hospital. [place].   Contacts, dentures or bridgework may not be worn into surgery.    Patients discharged the day of surgery will not be allowed to drive home.   Name and phone number of your driver:   Special Instructions: Bring a copy of your healthcare power of attorney and living will documents         the day of surgery if you haven't scanned them in before.              Please read over the following fact sheets you were given:  [facility] - Preparing for Surgery Before surgery, you can play an important role.  Because skin is not sterile, your skin needs to be as free of germs as possible.  You can reduce the number of germs on your skin by washing with CHG (chlorahexidine gluconate) soap before surgery.  CHG is an antiseptic cleaner which kills germs and bonds with the skin to continue killing germs even after washing. Please DO NOT use if you have an allergy to CHG or antibacterial soaps.  If your skin becomes reddened/irritated stop using the CHG and inform your nurse when you arrive at Short Stay. Do not shave (including legs and underarms) for at least 48 hours prior to the first CHG shower.  You may shave your face/neck.  Please follow these instructions carefully:  1.  Shower with CHG Soap the night before surgery and the  morning of surgery.  2.  If you choose to wash your hair, wash your hair first as usual with your normal  shampoo.  3.  After you shampoo, rinse your hair and body thoroughly to remove the shampoo.  4.  Use CHG as you would any other liquid soap.  You can apply chg directly to the skin and wash.  Gently with a scrungie or clean washcloth.  5.  Apply the CHG Soap to your body ONLY FROM THE NECK DOWN.   Do   not  use on face/ open                           Wound or open sores. Avoid contact with eyes, ears mouth and   genitals (private parts).                       Wash face,  Genitals (private parts) with your normal soap.             6.  Wash thoroughly, paying special attention to the area where your    surgery  will be performed.  7.  Thoroughly rinse your body with warm water from the neck down.  8.  DO NOT shower/wash with your normal soap after using and rinsing off the CHG Soap.                9.  Pat yourself dry with a clean towel.            10.  Wear clean pajamas.            11.  Place clean sheets on your bed the night of your first shower and do not  sleep with pets. Day of Surgery : Do not apply any lotions/deodorants the morning of surgery.  Please wear clean clothes to the hospital/surgery center.  FAILURE TO FOLLOW THESE INSTRUCTIONS MAY RESULT IN THE CANCELLATION OF YOUR SURGERY  PATIENT SIGNATURE_________________________________  NURSE SIGNATURE__________________________________  ________________________________________________________________________

## 2018-05-30 NOTE — Pre-Procedure Instructions (Signed)
The following are in epic: Cardiac clearance 04/23/2018 EKG 12/21/2017 Last office visit Dr. Irish Lack 12/21/2017 ECHO 12/06/2017

## 2018-06-05 ENCOUNTER — Other Ambulatory Visit: Payer: Self-pay

## 2018-06-05 ENCOUNTER — Encounter (HOSPITAL_COMMUNITY): Payer: Self-pay

## 2018-06-05 ENCOUNTER — Encounter (HOSPITAL_COMMUNITY)
Admission: RE | Admit: 2018-06-05 | Discharge: 2018-06-05 | Disposition: A | Discharge status: Home / Self Care | Payer: 59 | Source: Ambulatory Visit | Attending: Surgery | Admitting: Surgery

## 2018-06-05 DIAGNOSIS — Z01818 Encounter for other preprocedural examination: Secondary | ICD-10-CM | POA: Insufficient documentation

## 2018-06-05 HISTORY — DX: Anemia, unspecified: D64.9

## 2018-06-05 HISTORY — DX: Obesity, unspecified: E66.9

## 2018-06-05 HISTORY — DX: Essential (primary) hypertension: I10

## 2018-06-05 HISTORY — DX: Prediabetes: R73.03

## 2018-06-05 HISTORY — DX: Umbilical hernia without obstruction or gangrene: K42.9

## 2018-06-05 HISTORY — DX: Fatty (change of) liver, not elsewhere classified: K76.0

## 2018-06-05 HISTORY — DX: Unspecified asthma, uncomplicated: J45.909

## 2018-06-05 HISTORY — DX: Gastro-esophageal reflux disease without esophagitis: K21.9

## 2018-06-05 HISTORY — DX: Personal history of other diseases of the circulatory system: Z86.79

## 2018-06-05 HISTORY — DX: Encephalopathy, unspecified: G93.40

## 2018-06-05 HISTORY — DX: Epistaxis: R04.0

## 2018-06-05 HISTORY — DX: Mixed hyperlipidemia: E78.2

## 2018-06-05 HISTORY — DX: Other seborrheic keratosis: L82.1

## 2018-06-05 HISTORY — DX: Discitis, unspecified, lumbar region: M46.46

## 2018-06-05 HISTORY — DX: Bacteremia: R78.81

## 2018-06-05 HISTORY — DX: Personal history of other infectious and parasitic diseases: Z86.19

## 2018-06-05 HISTORY — DX: Deviated nasal septum: J34.2

## 2018-06-05 HISTORY — DX: Cardiac murmur, unspecified: R01.1

## 2018-06-05 LAB — CBC
HCT: 42.2 % (ref 39.0–52.0)
Hemoglobin: 13.3 g/dL (ref 13.0–17.0)
MCH: 29.6 pg (ref 26.0–34.0)
MCHC: 31.5 g/dL (ref 30.0–36.0)
MCV: 93.8 fL (ref 80.0–100.0)
PLATELETS: 229 10*3/uL (ref 150–400)
RBC: 4.5 MIL/uL (ref 4.22–5.81)
RDW: 13.2 % (ref 11.5–15.5)
WBC: 7.2 10*3/uL (ref 4.0–10.5)
nRBC: 0 % (ref 0.0–0.2)

## 2018-06-05 LAB — BASIC METABOLIC PANEL
ANION GAP: 7 (ref 5–15)
BUN: 28 mg/dL — AB (ref 8–23)
CALCIUM: 9.3 mg/dL (ref 8.9–10.3)
CO2: 24 mmol/L (ref 22–32)
CREATININE: 0.88 mg/dL (ref 0.61–1.24)
Chloride: 109 mmol/L (ref 98–111)
GFR calc Af Amer: >60 mL/min (ref >60)
GLUCOSE: 99 mg/dL (ref 70–99)
Potassium: 4.9 mmol/L (ref 3.5–5.1)
Sodium: 140 mmol/L (ref 135–145)

## 2018-06-05 LAB — HEMOGLOBIN A1C
HEMOGLOBIN A1C: 4.9 % (ref 4.8–5.6)
MEAN PLASMA GLUCOSE: 93.93 mg/dL

## 2018-06-06 NOTE — Pre-Procedure Instructions (Signed)
BMP results 06/05/2018 sent to Dr. Johney Maine via epic.

## 2018-06-12 MED ORDER — BUPIVACAINE LIPOSOME 1.3 % IJ SUSP
20.0000 mL | INTRAMUSCULAR | Status: DC
  Filled 2018-06-12: qty 20

## 2018-06-13 ENCOUNTER — Ambulatory Visit (HOSPITAL_COMMUNITY): Payer: 59 | Admitting: Certified Registered Nurse Anesthetist

## 2018-06-13 ENCOUNTER — Ambulatory Visit (HOSPITAL_COMMUNITY)
Admission: RE | Admit: 2018-06-13 | Discharge: 2018-06-13 | Disposition: A | Discharge status: Home / Self Care | Payer: 59 | Source: Ambulatory Visit | Attending: Surgery | Admitting: Surgery

## 2018-06-13 ENCOUNTER — Encounter (HOSPITAL_COMMUNITY)
Admission: RE | Disposition: A | Discharge status: Home / Self Care | Payer: Self-pay | Source: Ambulatory Visit | Attending: Surgery

## 2018-06-13 ENCOUNTER — Encounter (HOSPITAL_COMMUNITY): Payer: Self-pay | Admitting: *Deleted

## 2018-06-13 ENCOUNTER — Other Ambulatory Visit: Payer: Self-pay

## 2018-06-13 DIAGNOSIS — K439 Ventral hernia without obstruction or gangrene: Secondary | ICD-10-CM | POA: Diagnosis not present

## 2018-06-13 DIAGNOSIS — Z952 Presence of prosthetic heart valve: Secondary | ICD-10-CM | POA: Insufficient documentation

## 2018-06-13 DIAGNOSIS — Z79899 Other long term (current) drug therapy: Secondary | ICD-10-CM | POA: Diagnosis not present

## 2018-06-13 DIAGNOSIS — Z9989 Dependence on other enabling machines and devices: Secondary | ICD-10-CM | POA: Diagnosis not present

## 2018-06-13 DIAGNOSIS — G4733 Obstructive sleep apnea (adult) (pediatric): Secondary | ICD-10-CM | POA: Insufficient documentation

## 2018-06-13 DIAGNOSIS — I10 Essential (primary) hypertension: Secondary | ICD-10-CM | POA: Diagnosis not present

## 2018-06-13 DIAGNOSIS — M6208 Separation of muscle (nontraumatic), other site: Secondary | ICD-10-CM | POA: Insufficient documentation

## 2018-06-13 DIAGNOSIS — Z7951 Long term (current) use of inhaled steroids: Secondary | ICD-10-CM | POA: Diagnosis not present

## 2018-06-13 DIAGNOSIS — Z7901 Long term (current) use of anticoagulants: Secondary | ICD-10-CM | POA: Diagnosis not present

## 2018-06-13 DIAGNOSIS — Z6833 Body mass index (BMI) 33.0-33.9, adult: Secondary | ICD-10-CM | POA: Insufficient documentation

## 2018-06-13 DIAGNOSIS — J449 Chronic obstructive pulmonary disease, unspecified: Secondary | ICD-10-CM | POA: Insufficient documentation

## 2018-06-13 DIAGNOSIS — K429 Umbilical hernia without obstruction or gangrene: Secondary | ICD-10-CM | POA: Diagnosis not present

## 2018-06-13 HISTORY — PX: INSERTION OF MESH: SHX5868

## 2018-06-13 HISTORY — PX: VENTRAL HERNIA REPAIR: SHX424

## 2018-06-13 LAB — PROTIME-INR
INR: 1.1
Prothrombin Time: 14.1 seconds (ref 11.4–15.2)

## 2018-06-13 SURGERY — REPAIR, HERNIA, VENTRAL, LAPAROSCOPIC
Anesthesia: General

## 2018-06-13 MED ORDER — GABAPENTIN 300 MG PO CAPS
300.0000 mg | ORAL_CAPSULE | ORAL | Status: AC
  Administered 2018-06-13: 300 mg via ORAL
  Filled 2018-06-13: qty 1

## 2018-06-13 MED ORDER — OXYCODONE HCL 5 MG PO TABS: 5.0000 mg | ORAL_TABLET | Freq: Once | ORAL | Status: DC | PRN

## 2018-06-13 MED ORDER — BUPIVACAINE-EPINEPHRINE 0.25% -1:200000 IJ SOLN
INTRAMUSCULAR | Status: DC | PRN
  Administered 2018-06-13: 60 mL

## 2018-06-13 MED ORDER — LIDOCAINE HCL (PF) 2 % IJ SOLN
INTRAMUSCULAR | Status: AC
  Filled 2018-06-13: qty 20

## 2018-06-13 MED ORDER — KETOROLAC TROMETHAMINE 30 MG/ML IJ SOLN
30.0000 mg | Freq: Once | INTRAMUSCULAR | Status: AC | PRN
  Administered 2018-06-13: 30 mg via INTRAVENOUS

## 2018-06-13 MED ORDER — 0.9 % SODIUM CHLORIDE (POUR BTL) OPTIME
TOPICAL | Status: DC | PRN
  Administered 2018-06-13: 1000 mL

## 2018-06-13 MED ORDER — LIDOCAINE 2% (20 MG/ML) 5 ML SYRINGE
INTRAMUSCULAR | Status: DC | PRN
  Administered 2018-06-13: 60 mg via INTRAVENOUS

## 2018-06-13 MED ORDER — PROMETHAZINE HCL 25 MG/ML IJ SOLN: 6.2500 mg | INTRAMUSCULAR | Status: DC | PRN

## 2018-06-13 MED ORDER — ROCURONIUM BROMIDE 100 MG/10ML IV SOLN
INTRAVENOUS | Status: AC
  Filled 2018-06-13: qty 1

## 2018-06-13 MED ORDER — BUPIVACAINE LIPOSOME 1.3 % IJ SUSP
INTRAMUSCULAR | Status: DC | PRN
  Administered 2018-06-13: 20 mL

## 2018-06-13 MED ORDER — CELECOXIB 200 MG PO CAPS
200.0000 mg | ORAL_CAPSULE | ORAL | Status: AC
  Administered 2018-06-13: 200 mg via ORAL
  Filled 2018-06-13: qty 1

## 2018-06-13 MED ORDER — TRAMADOL HCL 50 MG PO TABS: 50.0000 mg | ORAL_TABLET | Freq: Four times a day (QID) | ORAL | 0 refills | Status: DC | PRN

## 2018-06-13 MED ORDER — LIDOCAINE 2% (20 MG/ML) 5 ML SYRINGE
INTRAMUSCULAR | Status: AC
  Filled 2018-06-13: qty 5

## 2018-06-13 MED ORDER — OXYCODONE HCL 5 MG/5ML PO SOLN
5.0000 mg | Freq: Once | ORAL | Status: DC | PRN
  Filled 2018-06-13: qty 5

## 2018-06-13 MED ORDER — LIDOCAINE 2% (20 MG/ML) 5 ML SYRINGE
INTRAMUSCULAR | Status: DC | PRN
  Administered 2018-06-13: 1.5 mg/kg/h via INTRAVENOUS

## 2018-06-13 MED ORDER — KETOROLAC TROMETHAMINE 30 MG/ML IJ SOLN
INTRAMUSCULAR | Status: AC
  Administered 2018-06-13: 30 mg via INTRAVENOUS
  Filled 2018-06-13: qty 1

## 2018-06-13 MED ORDER — MIDAZOLAM HCL 5 MG/5ML IJ SOLN
INTRAMUSCULAR | Status: DC | PRN
  Administered 2018-06-13: 2 mg via INTRAVENOUS

## 2018-06-13 MED ORDER — EPHEDRINE SULFATE-NACL 50-0.9 MG/10ML-% IV SOSY
PREFILLED_SYRINGE | INTRAVENOUS | Status: DC | PRN
  Administered 2018-06-13: 10 mg via INTRAVENOUS

## 2018-06-13 MED ORDER — SUGAMMADEX SODIUM 200 MG/2ML IV SOLN
INTRAVENOUS | Status: DC | PRN
  Administered 2018-06-13: 200 mg via INTRAVENOUS

## 2018-06-13 MED ORDER — LIDOCAINE HCL 2 % IJ SOLN
INTRAMUSCULAR | Status: AC
  Filled 2018-06-13: qty 40

## 2018-06-13 MED ORDER — FENTANYL CITRATE (PF) 100 MCG/2ML IJ SOLN: 25.0000 ug | INTRAMUSCULAR | Status: DC | PRN

## 2018-06-13 MED ORDER — KETAMINE HCL 10 MG/ML IJ SOLN
INTRAMUSCULAR | Status: AC
  Filled 2018-06-13: qty 1

## 2018-06-13 MED ORDER — DEXAMETHASONE SODIUM PHOSPHATE 10 MG/ML IJ SOLN
INTRAMUSCULAR | Status: DC | PRN
  Administered 2018-06-13: 10 mg via INTRAVENOUS

## 2018-06-13 MED ORDER — ROCURONIUM BROMIDE 10 MG/ML (PF) SYRINGE
PREFILLED_SYRINGE | INTRAVENOUS | Status: DC | PRN
  Administered 2018-06-13: 70 mg via INTRAVENOUS
  Administered 2018-06-13: 10 mg via INTRAVENOUS

## 2018-06-13 MED ORDER — PROPOFOL 10 MG/ML IV BOLUS
INTRAVENOUS | Status: DC | PRN
  Administered 2018-06-13: 180 mg via INTRAVENOUS

## 2018-06-13 MED ORDER — ONDANSETRON HCL 4 MG/2ML IJ SOLN
INTRAMUSCULAR | Status: DC | PRN
  Administered 2018-06-13: 4 mg via INTRAVENOUS

## 2018-06-13 MED ORDER — ACETAMINOPHEN 500 MG PO TABS
1000.0000 mg | ORAL_TABLET | ORAL | Status: AC
  Administered 2018-06-13: 1000 mg via ORAL
  Filled 2018-06-13: qty 2

## 2018-06-13 MED ORDER — LACTATED RINGERS IV SOLN
INTRAVENOUS | Status: DC
  Administered 2018-06-13: 12:00:00 via INTRAVENOUS

## 2018-06-13 MED ORDER — ONDANSETRON HCL 4 MG/2ML IJ SOLN
INTRAMUSCULAR | Status: AC
  Filled 2018-06-13: qty 2

## 2018-06-13 MED ORDER — FENTANYL CITRATE (PF) 250 MCG/5ML IJ SOLN
INTRAMUSCULAR | Status: AC
  Filled 2018-06-13: qty 5

## 2018-06-13 MED ORDER — FENTANYL CITRATE (PF) 100 MCG/2ML IJ SOLN
INTRAMUSCULAR | Status: AC
  Filled 2018-06-13: qty 2

## 2018-06-13 MED ORDER — MIDAZOLAM HCL 2 MG/2ML IJ SOLN
INTRAMUSCULAR | Status: AC
  Filled 2018-06-13: qty 2

## 2018-06-13 MED ORDER — DEXAMETHASONE SODIUM PHOSPHATE 10 MG/ML IJ SOLN
INTRAMUSCULAR | Status: AC
  Filled 2018-06-13: qty 1

## 2018-06-13 MED ORDER — KETAMINE HCL 10 MG/ML IJ SOLN
INTRAMUSCULAR | Status: DC | PRN
  Administered 2018-06-13: 40 mg via INTRAVENOUS

## 2018-06-13 MED ORDER — BUPIVACAINE-EPINEPHRINE (PF) 0.25% -1:200000 IJ SOLN
INTRAMUSCULAR | Status: AC
  Filled 2018-06-13: qty 60

## 2018-06-13 MED ORDER — FENTANYL CITRATE (PF) 250 MCG/5ML IJ SOLN
INTRAMUSCULAR | Status: DC | PRN
  Administered 2018-06-13 (×4): 50 ug via INTRAVENOUS
  Administered 2018-06-13: 100 ug via INTRAVENOUS
  Administered 2018-06-13: 50 ug via INTRAVENOUS

## 2018-06-13 MED ORDER — CEFAZOLIN SODIUM-DEXTROSE 2-4 GM/100ML-% IV SOLN
2.0000 g | INTRAVENOUS | Status: AC
  Administered 2018-06-13: 2 g via INTRAVENOUS
  Filled 2018-06-13: qty 100

## 2018-06-13 MED ORDER — PROPOFOL 10 MG/ML IV BOLUS
INTRAVENOUS | Status: AC
  Filled 2018-06-13: qty 20

## 2018-06-13 SURGICAL SUPPLY — 47 items
APPLIER CLIP 5 13 M/L LIGAMAX5 (MISCELLANEOUS)
BINDER ABDOMINAL 12 ML 46-62 (SOFTGOODS) ×2 IMPLANT
CABLE HIGH FREQUENCY MONO STRZ (ELECTRODE) ×2 IMPLANT
CHLORAPREP W/TINT 26ML (MISCELLANEOUS) ×2 IMPLANT
CLIP APPLIE 5 13 M/L LIGAMAX5 (MISCELLANEOUS) IMPLANT
COVER SURGICAL LIGHT HANDLE (MISCELLANEOUS) ×2 IMPLANT
COVER WAND RF STERILE (DRAPES) ×2 IMPLANT
DEVICE SECURE STRAP 25 ABSORB (INSTRUMENTS) ×2 IMPLANT
DEVICE TROCAR PUNCTURE CLOSURE (ENDOMECHANICALS) ×2 IMPLANT
DRAPE WARM FLUID 44X44 (DRAPE) ×2 IMPLANT
DRSG TEGADERM 2-3/8X2-3/4 SM (GAUZE/BANDAGES/DRESSINGS) ×2 IMPLANT
DRSG TEGADERM 4X4.75 (GAUZE/BANDAGES/DRESSINGS) ×2 IMPLANT
ELECT REM PT RETURN 15FT ADLT (MISCELLANEOUS) ×2 IMPLANT
GAUZE SPONGE 2X2 8PLY STRL LF (GAUZE/BANDAGES/DRESSINGS) ×1 IMPLANT
GLOVE BIOGEL PI IND STRL 6 (GLOVE) ×1 IMPLANT
GLOVE BIOGEL PI IND STRL 6.5 (GLOVE) ×2 IMPLANT
GLOVE BIOGEL PI INDICATOR 6 (GLOVE) ×1
GLOVE BIOGEL PI INDICATOR 6.5 (GLOVE) ×2
GLOVE ECLIPSE 8.0 STRL XLNG CF (GLOVE) ×2 IMPLANT
GLOVE INDICATOR 8.0 STRL GRN (GLOVE) ×2 IMPLANT
GLOVE SURG SS PI 6.0 STRL IVOR (GLOVE) ×2 IMPLANT
GOWN STRL REUS W/ TWL LRG LVL3 (GOWN DISPOSABLE) ×1 IMPLANT
GOWN STRL REUS W/TWL LRG LVL3 (GOWN DISPOSABLE) ×1
GOWN STRL REUS W/TWL XL LVL3 (GOWN DISPOSABLE) ×4 IMPLANT
IRRIG SUCT STRYKERFLOW 2 WTIP (MISCELLANEOUS)
IRRIGATION SUCT STRKRFLW 2 WTP (MISCELLANEOUS) IMPLANT
KIT BASIN OR (CUSTOM PROCEDURE TRAY) ×2 IMPLANT
MARKER SKIN DUAL TIP RULER LAB (MISCELLANEOUS) ×2 IMPLANT
MESH VENTRALIGHT ST 6X8 (Mesh Specialty) ×1 IMPLANT
MESH VENTRLGHT ELLIPSE 8X6XMFL (Mesh Specialty) ×1 IMPLANT
NEEDLE SPNL 22GX3.5 QUINCKE BK (NEEDLE) ×4 IMPLANT
PAD POSITIONING PINK XL (MISCELLANEOUS) ×2 IMPLANT
SCISSORS LAP 5X35 DISP (ENDOMECHANICALS) ×2 IMPLANT
SHEARS HARMONIC ACE PLUS 36CM (ENDOMECHANICALS) IMPLANT
SLEEVE ADV FIXATION 5X100MM (TROCAR) ×2 IMPLANT
SPONGE GAUZE 2X2 STER 10/PKG (GAUZE/BANDAGES/DRESSINGS) ×1
STAPLER VISISTAT 35W (STAPLE) ×2 IMPLANT
STRIP CLOSURE SKIN 1/2X4 (GAUZE/BANDAGES/DRESSINGS) ×2 IMPLANT
SUT MNCRL AB 4-0 PS2 18 (SUTURE) ×4 IMPLANT
SUT PDS AB 1 CT1 27 (SUTURE) ×8 IMPLANT
SUT PROLENE 1 CT 1 30 (SUTURE) ×12 IMPLANT
TOWEL OR 17X26 10 PK STRL BLUE (TOWEL DISPOSABLE) ×2 IMPLANT
TRAY LAPAROSCOPIC (CUSTOM PROCEDURE TRAY) ×2 IMPLANT
TROCAR ADV FIXATION 11X100MM (TROCAR) IMPLANT
TROCAR ADV FIXATION 5X100MM (TROCAR) ×2 IMPLANT
TROCAR BLADELESS OPT 5 100 (ENDOMECHANICALS) ×2 IMPLANT
TUBING INSUF HEATED (TUBING) ×2 IMPLANT

## 2018-06-13 NOTE — Anesthesia Procedure Notes (Signed)
Procedure Name: Intubation Date/Time: 06/13/2018 1:47 PM Performed by: Mitzie Na, CRNA Pre-anesthesia Checklist: Patient identified, Emergency Drugs available, Suction available, Patient being monitored and Timeout performed Patient Re-evaluated:Patient Re-evaluated prior to induction Oxygen Delivery Method: Circle system utilized Preoxygenation: Pre-oxygenation with 100% oxygen Induction Type: IV induction Ventilation: Mask ventilation without difficulty Laryngoscope Size: Mac and 4 Grade View: Grade I Tube type: Oral Tube size: 7.5 mm Number of attempts: 1 Airway Equipment and Method: Stylet Placement Confirmation: ETT inserted through vocal cords under direct vision,  positive ETCO2 and breath sounds checked- equal and bilateral Secured at: 24 cm Tube secured with: Tape Dental Injury: Teeth and Oropharynx as per pre-operative assessment

## 2018-06-13 NOTE — Transfer of Care (Signed)
Immediate Anesthesia Transfer of Care Note  Patient: Jeffery Whitehead  Procedure(s) Performed: LAPAROSCOPIC VENTRAL WALL HERNIA REPAIR ERAS PATHWAY (N/A ) INSERTION OF MESH (N/A )  Patient Location: PACU  Anesthesia Type:General  Level of Consciousness: awake, alert , oriented and patient cooperative  Airway & Oxygen Therapy: Patient Spontanous Breathing and Patient connected to face mask oxygen  Post-op Assessment: Report given to RN, Post -op Vital signs reviewed and stable and Patient moving all extremities  Post vital signs: Reviewed and stable  Last Vitals:  Vitals Value Taken Time  BP 165/88 06/13/2018  3:31 PM  Temp    Pulse 86 06/13/2018  3:34 PM  Resp 16 06/13/2018  3:34 PM  SpO2 100 % 06/13/2018  3:34 PM  Vitals shown include unvalidated device data.  Last Pain:  Vitals:   06/13/18 1204  TempSrc:   PainSc: 0-No pain         Complications: No apparent anesthesia complications

## 2018-06-13 NOTE — Op Note (Signed)
06/13/2018  PATIENT:  Jeffery Whitehead  62 y.o. male  Patient Care Team: Nickola Major, MD as PCP - General (Family Medicine) [name], MD as PCP - Cardiology (Cardiology) Michael Boston, MD as Consulting Physician (General Surgery) [name], MD as Consulting Physician (Cardiology) Jeffery Gobble, MD as Consulting Physician (Pulmonary Disease)  PRE-OPERATIVE DIAGNOSIS:  Umbilical ventral abdominal wall hernia  POST-OPERATIVE DIAGNOSIS:  Umbilical ventral abdominal wall hernia   PROCEDURE:  LAPAROSCOPIC REPAIR OF  Periumbilical New abdominal wall HERNIA WITH MESH  SURGEON:  Adin Hector, MD  ASSISTANT: Carlena Hurl, PA-C  ANESTHESIA:     General  Nerve block provided with liposomal bupivacaine (Experel) mixed with 0.25% bupivacaine as a Bilateral TAP block x30 mL at the level of the transverse abdominis & preperitoneal spaces along the flank at the anterior axillary line, from subcostal ridge to iliac crest under laparoscopic guidance    Local anesthesia field block: (0.25% bupivacaine &  liposomal  Bupivacaine [Experel])  EBL:  No intake/output data recorded.  Per anesthesia record  Delay start of Pharmacological VTE agent (>24hrs) due to surgical blood loss or risk of bleeding:  no  DRAINS: none   SPECIMEN:  No Specimen  DISPOSITION OF SPECIMEN:  N/A  COUNTS:  YES  PLAN OF CARE: Discharge to home after PACU  PATIENT DISPOSITION:  PACU - hemodynamically stable.  INDICATION: Pleasant patient has developed a ventral wall abdominal hernia periumbilically.  Increasing sensitivity and discomfort.  Wished to consider repair.    Recommendation was made for surgical repair:  The anatomy & physiology of the abdominal wall was discussed. The pathophysiology of hernias was discussed. Natural history risks without surgery including progeressive enlargement, pain, incarceration & strangulation was discussed. Contributors to complications such as smoking,  obesity, diabetes, prior surgery, etc were discussed.  I feel the risks of no intervention will lead to serious problems that outweigh the operative risks; therefore, I recommended surgery to reduce and repair the hernia. I explained laparoscopic techniques with possible need for an open approach. I noted the probable use of mesh to patch and/or buttress the hernia repair  Risks such as bleeding, infection, abscess, need for further treatment, heart attack, death, and other risks were discussed. I noted a good likelihood this will help address the problem. Goals of post-operative recovery were discussed as well. Possibility that this will not correct all symptoms was explained. I stressed the importance of low-impact activity, aggressive pain control, avoiding constipation, & not pushing through pain to minimize risk of post-operative chronic pain or injury. Possibility of reherniation especially with smoking, obesity, diabetes, immunosuppression, and other health conditions was discussed. We will work to minimize complications.  An educational handout further explaining the pathology & treatment options was given as well. Questions were answered. The patient expresses understanding & wishes to proceed with surgery.   OR FINDINGS: 3 x 2 cm periumbilical hernia setting of significant diastases recti and morbid obesity.  Type of repair: Laparoscopic underlay repair   Placement of mesh: Intraperitoneal underlay repair  Name of mesh: Bard Ventralight dual sided (polypropylene / Seprafilm)  Size of mesh: 20x15cm  Orientation: Transverse  Mesh overlap:  5-7cm   DESCRIPTION:   Informed consent was confirmed. The patient underwent general anaesthesia without difficulty. The patient was positioned appropriately. VTE prevention in place. The patient's abdomen was clipped, prepped, & draped in a sterile fashion. Surgical timeout confirmed our plan.  The patient was positioned in reverse Trendelenburg.  Abdominal entry was  gained using optical entry technique in the left upper abdomen. Entry was clean. I induced carbon dioxide insufflation. Camera inspection revealed no injury. Extra ports were carefully placed under direct laparoscopic visualization.   I could see hernias on the parietal peritoneum under the abdominal wall.  I freed the falciform ligament and midline peritoneum to expose the preperitoneal space and posterior fascia.  He had significant diastases recti.  Some thinning out suspicious for some other hernia supraumbilically but no definite other locations aside from the umbilicus.  Given his thin abdominal wall with diastases and possible other periumbilical small Swiss cheese hernias, I decided to be more aggressive with a underlay mesh repair.   I chose a 20x15cm dual sided mesh.  I placed #1 Prolene stitches around its edge about every 5 cm = 12 total.  I rolled the mesh & placed into the peritoneal cavity through the hernia defect.  I unrolled the mesh and positioned it appropriately.  I laid it transversely to make sure that the lateral edges involved rectus in had good overlap beyond his significant diastases recti.  I secured the mesh to cover up the hernia defect using a laparoscopic suture passer to pass the tails of the Prolene through the abdominal wall & tagged them with clamps for good transfascial suturing.  I started out in four corners to make sure I had the mesh centered under the hernia defect appropriately, and then proceeded to work in quadrants.    We evacuated CO2 & desufflated the abdomen.  I tied the fascial stitches down. I closed the fascial defect that I placed the mesh through using #1 PDS interrupted transverse stitches primarily.  I reinsufflated the abdomen. The mesh provided at least circumferential coverage around the entire region of hernia defects.  I secured the mesh centrally with an additional trans fascial stitch in & out the mesh using #1 PDS under  laparoscopic visualization.   I tacked the edges & central part of the mesh to the peritoneum/posterior rectus fascia with SecureStrap absorbable tacks.   I did reinspection. Hemostasis was good. Mesh laid well. I completed a broad field block of local anesthesia at fascial stitch sites & fascial closure areas.    Capnoperitoneum was evacuated. Ports were removed.  Excess umbilical skin was excised.  Stalk was re-tacked down with 0 Vicryl suture.  The skin was closed with Monocryl at the port sites & umbilical hernia.  Steri-Strips on the fascial stitch puncture sites.  Patient is being extubated to go to the recovery room.  I discussed operative findings, updated the patient's status, discussed probable steps to recovery, and gave postoperative recommendations to the patient's spouse.  Called on the cell phone.  [phone].  Recommendations were made.  Questions were answered.  She expressed understanding & appreciation.    Adin Hector, M.D., F.A.C.S. Gastrointestinal and Minimally Invasive Surgery [facility] [address] [place], [place] [address] [phone] Main / Paging  06/13/2018 3:22 PM

## 2018-06-13 NOTE — Progress Notes (Signed)
PACU Nursing Note: Pt in Phase II PACU, pt meeting all criteria to be DC to home per MD orders and PACU Standards, despite over 2.36ml of IV fluid and pt taking in PO fluids, pt was unable to void. Bladder scan was performed with assessment and noted to only have 120ml in bladder. Pt resp status WNL, lungs clear bilaterally, NSR noted on monitor, no dependent edema noted. [facility] MD was paged to discuss urinary issues. Spoke with Dr. Leighton Ruff, MD of [facility]. Order was rec that staff my place urinary catheter and pt to go home with urinary catheter in place. Also inform pt and family to call MD office in am to make a follow up appt in 2 days (this Friday) for post op follow up and to have urinary catheter removed. Pt and family member informed of discussion with MD on call from [place] office. Pt asked if he could try to use the restroom again. Pt was very successful in voiding in bathroom, 267ml of normal urine noted. Will hold on placement of urinary catheter at this time and pt teaching done re: urinary retention issues at home if arises and whom to call.

## 2018-06-13 NOTE — Progress Notes (Signed)
Pacu [place] II  Pt unable to void despite oral fluids- 4 x 8 oz drinks, totla 3 L IV fluid. Spoke to [employer] for [place] surgery. Dr On call calling back.  Care hand off given to Legrand Como , RN and Leana Roe, RN.

## 2018-06-13 NOTE — Discharge Instructions (Signed)
HERNIA REPAIR: POST OP INSTRUCTIONS ° °###################################################################### ° °EAT °Gradually transition to a high fiber diet with a fiber supplement over the next few weeks after discharge.  Start with a pureed / full liquid diet (see below) ° °WALK °Walk an hour a day.  Control your pain to do that.   ° °CONTROL PAIN °Control pain so that you can walk, sleep, tolerate sneezing/coughing, and go up/down stairs. ° °HAVE A BOWEL MOVEMENT DAILY °Keep your bowels regular to avoid problems.  OK to try a laxative to override constipation.  OK to use an antidairrheal to slow down diarrhea.  Call if not better after 2 tries ° °CALL IF YOU HAVE PROBLEMS/CONCERNS °Call if you are still struggling despite following these instructions. °Call if you have concerns not answered by these instructions ° °###################################################################### ° ° ° °1. DIET: Follow a light bland diet the first 24 hours after arrival home, such as soup, liquids, crackers, etc.  Be sure to include lots of fluids daily.  Advance to a low fat / high fiber diet over the next few days after surgery.  Avoid fast food or heavy meals the first week as your are more likely to get nauseated.   ° °2. Take your usually prescribed home medications unless otherwise directed. ° °3. PAIN CONTROL: °a. Pain is best controlled by a usual combination of three different methods TOGETHER: °i. Ice/Heat °ii. Over the counter pain medication °iii. Prescription pain medication °b. Most patients will experience some swelling and bruising around the hernia(s) such as the bellybutton, groins, or old incisions.  Ice packs or heating pads (30-60 minutes up to 6 times a day) will help. Use ice for the first few days to help decrease swelling and bruising, then switch to heat to help relax tight/sore spots and speed recovery.  Some people prefer to use ice alone, heat alone, alternating between ice & heat.  Experiment  to what works for you.  Swelling and bruising can take several weeks to resolve.   °c. It is helpful to take an over-the-counter pain medication regularly for the first few weeks.  Choose one of the following that works best for you: °i. Naproxen (Aleve, etc)  Two 220mg tabs twice a day °ii. Ibuprofen (Advil, etc) Three 200mg tabs four times a day (every meal & bedtime) °iii. Acetaminophen (Tylenol, etc) 325-650mg four times a day (every meal & bedtime) °d. A  prescription for pain medication should be given to you upon discharge.  Take your pain medication as prescribed.  °i. If you are having problems/concerns with the prescription medicine (does not control pain, nausea, vomiting, rash, itching, etc), please call us [phone] to see if we need to switch you to a different pain medicine that will work better for you and/or control your side effect better. °ii. If you need a refill on your pain medication, please contact your pharmacy.  They will contact our office to request authorization. Prescriptions will not be filled after 5 pm or on week-ends. ° °4. Avoid getting constipated.  Between the surgery and the pain medications, it is common to experience some constipation.  Increasing fluid intake and taking a fiber supplement (such as Metamucil, Citrucel, FiberCon, MiraLax, etc) 1-2 times a day regularly will usually help prevent this problem from occurring.  A mild laxative (prune juice, Milk of Magnesia, MiraLax, etc) should be taken according to package directions if there are no bowel movements after 48 hours.   ° °5. Wash / shower every   day.  You may shower over the dressings as they are waterproof.   ° °6. Remove your waterproof bandages, skin tapes, and other bandages 5 days after surgery. You may replace a dressing/Band-Aid to cover the incision for comfort if you wish. You may leave the incisions open to air.  You may replace a dressing/Band-Aid to cover an incision for comfort if you wish.   Continue to shower over incision(s) after the dressing is off. ° °7. ACTIVITIES as tolerated:   °a. You may resume regular (light) daily activities beginning the next day--such as daily self-care, walking, climbing stairs--gradually increasing activities as tolerated.  Control your pain so that you can walk an hour a day.  If you can walk 30 minutes without difficulty, it is safe to try more intense activity such as jogging, treadmill, bicycling, low-impact aerobics, swimming, etc. °b. Save the most intensive and strenuous activity for last such as sit-ups, heavy lifting, contact sports, etc  Refrain from any heavy lifting or straining until you are off narcotics for pain control.   °c. DO NOT PUSH THROUGH PAIN.  Let pain be your guide: If it hurts to do something, don't do it.  Pain is your body warning you to avoid that activity for another week until the pain goes down. °d. You may drive when you are no longer taking prescription pain medication, you can comfortably wear a seatbelt, and you can safely maneuver your car and apply brakes. °e. You may have sexual intercourse when it is comfortable.  ° °8. FOLLOW UP in our office °a. Please call [facility] at [phone] to set up an appointment to see your surgeon in the office for a follow-up appointment approximately 2-3 weeks after your surgery. °b. Make sure that you call for this appointment the day you arrive home to insure a convenient appointment time. ° °9.  If you have disability of FMLA / Family leave forms, please bring the forms to the office for processing.  (do not give to your surgeon). ° °WHEN TO CALL US [phone]: °1. Poor pain control °2. Reactions / problems with new medications (rash/itching, nausea, etc)  °3. Fever over 101.5 F (38.5 C) °4. Inability to urinate °5. Nausea and/or vomiting °6. Worsening swelling or bruising °7. Continued bleeding from incision. °8. Increased pain, redness, or drainage from the incision ° ° The clinic staff is  available to answer your questions during regular business hours (8:30am-5pm).  Please don’t hesitate to call and ask to speak to one of our nurses for clinical concerns.  ° If you have a medical emergency, go to the nearest emergency room or call 911. ° A surgeon from [facility] is always on call at the hospitals in [place] ° °[facility] °[address], [address], [place], [place]  [address] ? ° [address] [address], [place], [place]   [address] °MAIN: [phone] ? TOLL FREE: [phone] ? FAX: [phone] °[email or web address] ° °

## 2018-06-13 NOTE — H&P (Signed)
Tye [place] DOB: [birth year]  Patient Care Team: Nickola Major, MD as PCP - General (Family Medicine) [name], MD as PCP - Cardiology (Cardiology) Michael Boston, MD as Consulting Physician (General Surgery) [name], MD as Consulting Physician (Cardiology) [name], MD as Consulting Physician (Pulmonary Disease)  ` Patient sent for surgical consultation at the request of Dr. Sharyon Medicus  Chief Complaint: Umbilical hernia  The patient is a pleasant male whose wife noticed a lump at his belly button. Recommended he discuss with primary care office. Patient does not know how long he's had that there. Has not gotten larger. Not particularly uncomfortable. No pain. He's never had any abdominal surgery. Surgical consultation requested. I saw him in April. Because it was a small umbilical hernia, I did not strongly push for surgical repair. He wish to hold off.  However, he's noticed that it is got larger. Much more sensitive and bothersome now. Cindy's worse when he was up in [place] for vacation this past summer. Pain did not go away. Now several months. Notice it when he gets up and turned to twists or sneezes or coughs. Very sensitive to light touch and bumping up again at student. Wishes to reconsider surgical repair. tient has a history of aortic valve replacement in his 53s. Warfarin. No major bleeding or other issues. No history of infection. Usually moves his bowels about 3 times a day. No real problem with urination or defecation. No history of fall or trauma. He does struggle with some severe back pain and does have some obesity. However those are again stable issues. Sounds like an episode of mild endocarditis over a year ago. No issues since. He does have a history of many subcutaneous lipomas requiring removal. He thinks he said 20 removed total. Not recently. He does have a few lumps in his upper abdomen and flanks.  However those are small and unchanging and do not seem to be bothersome he does have a history sleep apnea for which he uses CPAP machine. No major changes.  (Review of systems as stated in this history (HPI) or in the review of systems. Otherwise all other 12 point ROS are negative) ` ` `   Problem List/Past Medical Adin Hector, MD; 37/04/6268 4:85 AM) UMBILICAL HERNIA WITHOUT OBSTRUCTION AND WITHOUT GANGRENE (K42.9) OBESITY WITH SLEEP APNEA (G47.30) OSA (OBSTRUCTIVE SLEEP APNEA) (G47.33) PREOP - [place] - ENCOUNTER FOR PREOPERATIVE EXAMINATION FOR GENERAL SURGICAL PROCEDURE (Z01.818) ANTICOAGULATED (Z79.01)  Past Surgical History (Tanisha A. Owens Shark, [place]; 04/17/2018 9:29 AM) Tonsillectomy Valve Replacement  Diagnostic Studies History (Tanisha A. Owens Shark, [place]; 04/17/2018 9:29 AM) Colonoscopy 1-5 years ago  Allergies (Tanisha A. Owens Shark, [place]; 04/17/2018 9:29 AM) No Known Drug Allergies [10/02/2017]: Allergies Reconciled  Medication History (Tanisha A. Owens Shark, RMA; 04/17/2018 9:30 AM) Warfarin Sodium (7.5MG  Tablet, Oral) Active. Rosuvastatin Calcium (5MG  Tablet, Oral) Active. Symbicort (160-4.5MCG/ACT Aerosol, Inhalation) Active. Zolpidem Tartrate (10MG  Tablet, Oral) Active. ValACYclovir HCl (1GM Tablet, Oral) Active. Klor-Con M20 [facility] Tablet ER, Oral) Active. Methocarbamol (750MG  Tablet, Oral) Active. Medications Reconciled  Social History (Tanisha A. Owens Shark, RMA; 04/17/2018 9:29 AM) Alcohol use Moderate alcohol use. Caffeine use Carbonated beverages, Coffee, Tea. No drug use Tobacco use Never smoker.  Family History (Tanisha A. Owens Shark, [place]; 04/17/2018 9:29 AM) Cervical Cancer Mother. Colon Polyps Brother. Heart Disease Father. Melanoma Brother.  Other Problems Adin Hector, MD; 04/17/2018 9:55 AM) Asthma Back Pain Chronic Obstructive Lung Disease Gastroesophageal Reflux Disease Heart murmur Hemorrhoids High blood  pressure Other disease,  cancer, significant illness Sleep Apnea Umbilical Hernia Repair     Review of Systems (Tanisha A. Brown RMA; 04/17/2018 9:29 AM) General Present- Appetite Loss and Fatigue. Not Present- Chills, Fever, Night Sweats, Weight Gain and Weight Loss. Skin Present- Rash. Not Present- Change in Wart/Mole, Dryness, Hives, Jaundice, New Lesions, Non-Healing Wounds and Ulcer. HEENT Present- Hoarseness, Seasonal Allergies and Wears glasses/contact lenses. Not Present- Earache, Hearing Loss, Nose Bleed, Oral Ulcers, Ringing in the Ears, Sinus Pain, Sore Throat, Visual Disturbances and Yellow Eyes. Respiratory Present- Chronic Cough, Snoring and Wheezing. Not Present- Bloody sputum and Difficulty Breathing. Breast Not Present- Breast Mass, Breast Pain, Nipple Discharge and Skin Changes. Cardiovascular Present- Leg Cramps, Shortness of Breath and Swelling of Extremities. Not Present- Chest Pain, Difficulty Breathing Lying Down, Palpitations and Rapid Heart Rate. Gastrointestinal Present- Abdominal Pain. Not Present- Bloating, Bloody Stool, Change in Bowel Habits, Chronic diarrhea, Constipation, Difficulty Swallowing, Excessive gas, Gets full quickly at meals, Hemorrhoids, Indigestion, Nausea, Rectal Pain and Vomiting. Musculoskeletal Present- Back Pain, Joint Pain, Joint Stiffness, Muscle Pain, Muscle Weakness and Swelling of Extremities. Neurological Present- Weakness. Not Present- Decreased Memory, Fainting, Headaches, Numbness, Seizures, Tingling, Tremor and Trouble walking. Psychiatric Present- Depression. Not Present- Anxiety, Bipolar, Change in Sleep Pattern, Fearful and Frequent crying. Endocrine Not Present- Cold Intolerance, Excessive Hunger, Hair Changes, Heat Intolerance, Hot flashes and New Diabetes. Hematology Present- Blood Thinners. Not Present- Easy Bruising, Excessive bleeding, Gland problems, HIV and Persistent Infections.  Vitals (Tanisha A. Brown RMA;  04/17/2018 9:29 AM) 04/17/2018 9:29 AM Weight: 265.8 lb Height: 72in Body Surface Area: 2.4 m Body Mass Index: 36.05 kg/m  Temp.: 98.25F  Pulse: 73 (Regular)     BP 136/70   Pulse 69   Temp 98.3 F (36.8 C) (Oral)   Resp 16   SpO2 98%    Physical Exam Adin Hector MD; 04/17/2018 9:55 AM)  General Mental Status-Alert. General Appearance-Not in acute distress, Not Sickly. Orientation-Oriented X3. Hydration-Well hydrated. Voice-Normal.  Integumentary Global Assessment Upon inspection and palpation of skin surfaces of the - Axillae: non-tender, no inflammation or ulceration, no drainage. and Distribution of scalp and body hair is normal. General Characteristics Temperature - normal warmth is noted.  Head and Neck Head-normocephalic, atraumatic with no lesions or palpable masses. Face Global Assessment - atraumatic, no absence of expression. Neck Global Assessment - no abnormal movements, no bruit auscultated on the right, no bruit auscultated on the left, no decreased range of motion, non-tender. Trachea-midline. Thyroid Gland Characteristics - non-tender.  Eye Eyeball - Left-Extraocular movements intact, No Nystagmus. Eyeball - Right-Extraocular movements intact, No Nystagmus. Cornea - Left-No Hazy. Cornea - Right-No Hazy. Sclera/Conjunctiva - Left-No scleral icterus, No Discharge. Sclera/Conjunctiva - Right-No scleral icterus, No Discharge. Pupil - Left-Direct reaction to light normal. Pupil - Right-Direct reaction to light normal.  ENMT Ears Pinna - Left - no drainage observed, no generalized tenderness observed. Right - no drainage observed, no generalized tenderness observed. Nose and Sinuses External Inspection of the Nose - no destructive lesion observed. Inspection of the nares - Left - quiet respiration. Right - quiet respiration. Mouth and Throat Lips - Upper Lip - no fissures observed, no pallor  noted. Lower Lip - no fissures observed, no pallor noted. Nasopharynx - no discharge present. Oral Cavity/Oropharynx - Tongue - no dryness observed. Oral Mucosa - no cyanosis observed. Hypopharynx - no evidence of airway distress observed.  Chest and Lung Exam Inspection Movements - Normal and Symmetrical. Accessory muscles - No use of accessory muscles in  breathing. Palpation Palpation of the chest reveals - Non-tender. Auscultation Breath sounds - Normal and Clear.  Cardiovascular Auscultation Rhythm - Regular. Murmurs & Other Heart Sounds - Auscultation of the heart reveals - No Murmurs and No Systolic Clicks.  Abdomen Inspection Inspection of the abdomen reveals - No Visible peristalsis and No Abnormal pulsations. Umbilicus - No Bleeding, No Urine drainage. Palpation/Percussion Palpation and Percussion of the abdomen reveal - Soft, Non Tender, No Rebound tenderness, No Rigidity (guarding) and No Cutaneous hyperesthesia. Note: ` ` ` 25 mm mass reduces to 15 mm umbilical hernia. Very sensitive now but reducible. Moderate diastases recti    Abdomen morbidly obese but soft. Nontender. Not distended. No incisional hernias. No guarding. To-3 cm ellipsoid subcutaneous masses in epigastric abdomen consistent with lipomas. Doubt incisional hernias there.  Male Genitourinary Sexual Maturity Tanner 5 - Adult hair pattern and Adult penile size and shape.  Peripheral Vascular Upper Extremity Inspection - Left - No Cyanotic nailbeds, Not Ischemic. Right - No Cyanotic nailbeds, Not Ischemic.  Neurologic Neurologic evaluation reveals -normal attention span and ability to concentrate, able to name objects and repeat phrases. Appropriate fund of knowledge , normal sensation and normal coordination. Mental Status Affect - not angry, not paranoid. Cranial Nerves-Normal Bilaterally. Gait-Normal.  Neuropsychiatric Mental status exam performed with findings of-able  to articulate well with normal speech/language, rate, volume and coherence, thought content normal with ability to perform basic computations and apply abstract reasoning and no evidence of hallucinations, delusions, obsessions or homicidal/suicidal ideation.  Musculoskeletal Global Assessment Spine, Ribs and Pelvis - no instability, subluxation or laxity. Right Upper Extremity - no instability, subluxation or laxity. Note: Moderate thoracolumbar sensitivity. Moves quite slowly lying down. He prefers standup opacity hurts his back  Lymphatic Head & Neck  General Head & Neck Lymphatics: Bilateral - Description - No Localized lymphadenopathy. Axillary  General Axillary Region: Bilateral - Description - No Localized lymphadenopathy. Femoral & Inguinal  Generalized Femoral & Inguinal Lymphatics: Left - Description - No Localized lymphadenopathy. Right - Description - No Localized lymphadenopathy.    Assessment & Plan Adin Hector MD; 42/59/5638 7:56 AM)  UMBILICAL HERNIA WITHOUT OBSTRUCTION AND WITHOUT GANGRENE (K42.9) Impression: Not particularly large but very sensitive umbilical hernia in the setting of obesity and diastases recti.  Because it is more symptomatically, I think he would benefit from surgical repair. Would do underlay repair with mesh given his diastases recti and obesity. Lower recurrence chance.  He also has numerous subcutaneous lipomatous masses. Had many maroon in the past. He has a few his abdominal wall. Very large. Not symptomatic. Hold off on any further intervention on that.  Would like to get blessing from his cardiologist to make sure it safe to come off the warfarin. See if further workup is needed, although patient seen to be doing fine on the visit in June. Make sure there is no need for Lovenox bridge.  Patient does have sleep apnea but uses CPAP. Hopefully can be outpatient surgery. May have to watch overnight if anesthesia as  concerns.   The anatomy & physiology of the abdominal wall was discussed. The pathophysiology of hernias was discussed. Natural history risks without surgery including progeressive enlargement, pain, incarceration, & strangulation was discussed. Contributors to complications such as smoking, obesity, diabetes, prior surgery, etc were discussed.  I feel the risks of no intervention will lead to serious problems that outweigh the operative risks; therefore, I recommended surgery to reduce and repair the hernia. I explained laparoscopic techniques with  possible need for an open approach. I noted the probable use of mesh to patch and/or buttress the hernia repair  Risks such as bleeding, infection, abscess, need for further treatment, heart attack, death, and other risks were discussed. I noted a good likelihood this will help address the problem. Goals of post-operative recovery were discussed as well. Possibility that this will not correct all symptoms was explained. I stressed the importance of low-impact activity, aggressive pain control, avoiding constipation, & not pushing through pain to minimize risk of post-operative chronic pain or injury. Possibility of reherniation especially with smoking, obesity, diabetes, immunosuppression, and other health conditions was discussed. We will work to minimize complications.  An educational handout further explaining the pathology & treatment options was given as well. Questions were answered. The patient expresses understanding & wishes to proceed with surgery.  Pt Education - [facility] Hernia Post-Op HCI (Tabatha Razzano): discussed with patient and provided information. Pt Education - [facility] Pain Control (Talana Slatten) Pt Education - Pamphlet Given - Laparoscopic Hernia Repair: discussed with patient and provided information. Pt Education - [facility] Mesh education: discussed with patient and provided information.   OBESITY WITH SLEEP APNEA (G47.30)   OSA  (OBSTRUCTIVE SLEEP APNEA) (G47.33) Impression: Sleep apnea but uses nighttime CPAP well controlled. That may require him to stay overnight versus allowing to go home since he has home CPAP and is well-controlled. We'll see what anesthesia thinks  ANTICOAGULATED (Z79.01) Impression: Anticoagulated on warfarin after prosthetic aortic valve in his 79s.  We'll get input from his cardiologist, Dr. Irish Lack. Hold warfarin 5 days preop. Question will be this any further clearance and/or does he need a Lovenox bridge.  Current Plans I recommended obtaining preoperative cardiac clearance. I am concerned about the health of the patient and the ability to tolerate the operation. Therefore, we will request clearance by cardiology to better assess operative risk & see if a reevaluation, further workup, etc is needed. Also recommendations on how medications such as for anticoagulation and blood pressure should be managed/held/restarted after surgery. Pt Education - [facility] Hold anticoagulation preoperatively  Adin Hector, MD, FACS, MASCRS Gastrointestinal and Minimally Invasive Surgery    [address] [place], [place] [address] [phone] Main / Paging [phone] Fax

## 2018-06-13 NOTE — Anesthesia Postprocedure Evaluation (Signed)
Anesthesia Post Note  Patient: Jeffery Whitehead  Procedure(s) Performed: LAPAROSCOPIC VENTRAL WALL HERNIA REPAIR ERAS PATHWAY (N/A ) INSERTION OF MESH (N/A )     Patient location during evaluation: PACU Anesthesia Type: General Level of consciousness: awake and alert Pain management: pain level controlled Vital Signs Assessment: post-procedure vital signs reviewed and stable Respiratory status: spontaneous breathing, nonlabored ventilation, respiratory function stable and patient connected to nasal cannula oxygen Cardiovascular status: blood pressure returned to baseline and stable Postop Assessment: no apparent nausea or vomiting Anesthetic complications: no    Last Vitals:  Vitals:   06/13/18 1630 06/13/18 1700  BP: 129/82 124/78  Pulse: 81 90  Resp: 15 16  Temp:    SpO2: 95% 97%    Last Pain:  Vitals:   06/13/18 1700  TempSrc:   PainSc: 7                  Carlos Heber S

## 2018-06-13 NOTE — Anesthesia Preprocedure Evaluation (Addendum)
Anesthesia Evaluation  Patient identified by MRN, date of birth, ID band Patient awake    Reviewed: Allergy & Precautions, NPO status , Patient's Chart, lab work & pertinent test results  Airway Mallampati: II  TM Distance: >3 FB Neck ROM: Full    Dental no notable dental hx.    Pulmonary asthma , sleep apnea and Continuous Positive Airway Pressure Ventilation ,    Pulmonary exam normal breath sounds clear to auscultation       Cardiovascular hypertension, + Valvular Problems/Murmurs AI  Rhythm:Regular Rate:Normal + Systolic murmurs Left ventricle: The cavity size was mildly dilated. Wall   thickness was normal. Systolic function was normal. The estimated   ejection fraction was in the range of 55% to 60%. Left   ventricular diastolic function parameters were normal. - Aortic valve: Mechanical ? St Jude AVR with stable but elevated   gradients similar to 06/2017. DVI abnormal .24 along with mild   central AR suggests   some valve dysfunction Valve area (VTI): 0.9 cm^2. Valve area   (Vmax): 0.8 cm^2. Valve area (Vmean): 0.9 cm^2. - Mitral valve: There was mild regurgitation. - Left atrium: The atrium was moderately dilated. - Atrial septum: No defect or patent foramen ovale was identified.    Neuro/Psych negative neurological ROS  negative psych ROS   GI/Hepatic negative GI ROS, Neg liver ROS,   Endo/Other  negative endocrine ROS  Renal/GU negative Renal ROS  negative genitourinary   Musculoskeletal negative musculoskeletal ROS (+)   Abdominal   Peds negative pediatric ROS (+)  Hematology negative hematology ROS (+)   Anesthesia Other Findings   Reproductive/Obstetrics negative OB ROS                             Anesthesia Physical Anesthesia Plan  ASA: III  Anesthesia Plan: General   Post-op Pain Management:    Induction: Intravenous  PONV Risk Score and Plan: 2 and  Ondansetron, Dexamethasone and Treatment may vary due to age or medical condition  Airway Management Planned: Oral ETT  Additional Equipment:   Intra-op Plan:   Post-operative Plan: Extubation in OR  Informed Consent: I have reviewed the patients History and Physical, chart, labs and discussed the procedure including the risks, benefits and alternatives for the proposed anesthesia with the patient or authorized representative who has indicated his/her understanding and acceptance.   Dental advisory given  Plan Discussed with: CRNA and Surgeon  Anesthesia Plan Comments:         Anesthesia Quick Evaluation

## 2018-06-14 ENCOUNTER — Encounter (HOSPITAL_COMMUNITY): Payer: Self-pay | Admitting: Surgery

## 2018-07-19 ENCOUNTER — Encounter: Payer: Self-pay | Admitting: Interventional Cardiology

## 2018-07-19 ENCOUNTER — Ambulatory Visit: Payer: 59 | Admitting: Interventional Cardiology

## 2018-07-19 VITALS — BP 126/66 | HR 68 | Ht 72.0 in | Wt 231.8 lb

## 2018-07-19 DIAGNOSIS — Z7901 Long term (current) use of anticoagulants: Secondary | ICD-10-CM

## 2018-07-19 DIAGNOSIS — Z952 Presence of prosthetic heart valve: Secondary | ICD-10-CM

## 2018-07-19 DIAGNOSIS — I350 Nonrheumatic aortic (valve) stenosis: Secondary | ICD-10-CM

## 2018-07-19 DIAGNOSIS — I351 Nonrheumatic aortic (valve) insufficiency: Secondary | ICD-10-CM

## 2018-07-19 DIAGNOSIS — E782 Mixed hyperlipidemia: Secondary | ICD-10-CM

## 2018-07-19 NOTE — Patient Instructions (Signed)
,  Medication Instructions:  Your physician recommends that you continue on your current medications as directed. Please refer to the Current Medication list given to you today.  If you need a refill on your cardiac medications before your next appointment, please call your pharmacy.   Lab work: None Ordered  If you have labs (blood work) drawn today and your tests are completely normal, you will receive your results only by: Marland Kitchen MyChart Message (if you have MyChart) OR . A paper copy in the mail If you have any lab test that is abnormal or we need to change your treatment, we will call you to review the results.  Testing/Procedures: Your physician has requested that you have an echocardiogram in June 2020. Echocardiography is a painless test that uses sound waves to create images of your heart. It provides your doctor with information about the size and shape of your heart and how well your heart's chambers and valves are working. This procedure takes approximately one hour. There are no restrictions for this procedure.  Follow-Up: At [facility], you and your health needs are our priority.  As part of our continuing mission to provide you with exceptional heart care, we have created designated Provider Care Teams.  These Care Teams include your primary Cardiologist (physician) and Advanced Practice Providers (APPs -  Physician Assistants and Nurse Practitioners) who all work together to provide you with the care you need, when you need it. . You will need a follow up appointment in 6 months.  Please call our office 2 months in advance to schedule this appointment.  You may see Casandra Doffing, MD or one of the following Advanced Practice Providers on your designated Care Team:   . Lyda Jester, PA-C . Dayna Dunn, PA-C . Ermalinda Barrios, PA-C  Any Other Special Instructions Will Be Listed Below (If Applicable).

## 2018-07-19 NOTE — Progress Notes (Signed)
Cardiology Office Note   Date:  07/19/2018   ID:  Jeffery Whitehead, DOB 12-23-1955, MRN [id number]  PCP:  Jeffery Major, MD    No chief complaint on file.  S/p AVR  Wt Readings from Last 3 Encounters:  07/19/18 231 lb 12.8 oz (105.1 kg)  06/05/18 245 lb (111.1 kg)  12/21/17 259 lb (117.5 kg)       History of Present Illness: Jeffery Whitehead is a 63 y.o. male  who had an AVR in 69 at age 65, with a St Jude Valve; model number 25A-101; serial number [id number]; blood type A+. He has been on Coumadin since that time. He moved here from [place] in 2015 for his job at [employer]. He had a bicuspid aortic valve that was replaced. Only bleeding issue has been occasional bloody nose. He has had several cauterizations.   In 2015, he noticed some SHOB after he had a cold that has lasted a few weeks. He does have chronic dyspnea on exertion which has started since he gained weight. Echo at that time showed normal LV function.   He had some vein removal in the left leg with Dr. Kellie Simmering in 2015. THis went well. No leg swelling. No bleeding issues. THere are some veins in the right that are being watched.  He admitted and found to have MSSA bacteremia and endocarditis. From the discharge summary in 2/18: Sepsis (due to MSSA infection). -Presented with temperature of 101.3, respiratory rate of 33 and presence of bacteremia. -Patient given IV fluids, blood pressure was soft on admission. -Sepsis physiology resolved, continue current antibiotics. Patient on ancef and gentamicin for MSSA infection. Appears to be secondary to Lumbar discitis/endocarditis   MSSA bacteremia with endocarditis  -presumed to be secondary to Lumbar discitis  -TEE with small vegetation seen -discussed with ID; planning 6 weeks of IV Ancef and 2 weeks of IV gentamicin  -PICC line in place now. -will be follow on 09/27/16 at ID offices; keep line in place until evaluated by ID physician   Acute  encephalopathy -Likely acute metabolic encephalopathy secondary to sepsis. -This is resolved patient is back to his baseline.  He had an echo in May 2018 showing normal LV function with moderate AI and moderate to severe AS.  Occasional blood in stool. He has had issues with hemmorhoids.   He was to have another back procedure in December 2018 that was postponed by snow. Heeventually had a back procedure and has had some relief.   Echo in June 2019: - Left ventricle: The cavity size was mildly dilated. Wall thickness was normal. Systolic function was normal. The estimated ejection fraction was in the range of 55% to 60%. Left ventricular diastolic function parameters were normal. - Aortic valve: Mechanical ? St Jude AVR with stable but elevated gradients similar to 06/2017. DVI abnormal .24 along with mild central AR suggests some valve dysfunction Valve area (VTI): 0.9 cm^2. Valve area (Vmax): 0.8 cm^2. Valve area (Vmean): 0.9 cm^2. - Mitral valve: There was mild regurgitation. - Left atrium: The atrium was moderately dilated. - Atrial septum: No defect or patent foramen ovale was identified.  Denies : Chest pain. Leg edema. Nitroglycerin use. Orthopnea. Palpitations. Paroxysmal nocturnal dyspnea. Shortness of breath. Syncope.    Dizziness when standing from a bending position.  No exertional chest pain or dizziness.      Past Medical History:  Diagnosis Date  . Acute encephalopathy    Likely acute metabolic encephalopathy secondary to  sepsis  . Anemia   . Asthma   . Bloody nose    occ  . Cold   . Decreased anal sphincter tone   . Fatty liver 2018  . GERD (gastroesophageal reflux disease)   . Heart murmur   . Heart valve replaced    1983   st jude  . History of aortic insufficiency   . History of aortic stenosis   . History of endocarditis    prosthetic aortic valve endocarditis   . History of shingles 2019  . Hypertension   . Insomnia   .  Lumbar discitis   . Mixed hyperlipidemia   . MSSA bacteremia 2018   Sepsis  . Nasal septal deviation   . Obesity   . Pre-diabetes   . Seborrheic keratosis   . Shortness of breath dyspnea    Chronic  . Sleep apnea    cpap  . Umbilical hernia   . Varicose veins     Past Surgical History:  Procedure Laterality Date  . AORTIC VALVE REPLACEMENT  1983   St. Jude  . FLUOROSCOPY GUIDANCE N/A 08/24/2016   Procedure: Fluoroscopy Guidance;  Surgeon: Leonie Man, MD;  Location: [facility];  Service: Cardiovascular;  Laterality: N/A;  . INSERTION OF MESH N/A 06/13/2018   Procedure: INSERTION OF MESH;  Surgeon: Michael Boston, MD;  Location: [facility];  Service: General;  Laterality: N/A;  . Nasal cautery    . TEE WITHOUT CARDIOVERSION N/A 08/24/2016   Procedure: TRANSESOPHAGEAL ECHOCARDIOGRAM (TEE);  Surgeon: Pixie Casino, MD;  Location: [facility];  Service: Cardiovascular;  Laterality: N/A;  . TONSILLECTOMY    . VEIN LIGATION AND STRIPPING Left 08/15/2014   Procedure: SAPHENOUS VEIN LIGATION AND EXCISION OF VARICOSITIES;  Surgeon: Mal Misty, MD;  Location: [facility];  Service: Vascular;  Laterality: Left;  Marland Kitchen VENTRAL HERNIA REPAIR N/A 06/13/2018   Procedure: LAPAROSCOPIC VENTRAL WALL HERNIA REPAIR ERAS PATHWAY;  Surgeon: Michael Boston, MD;  Location: [facility];  Service: General;  Laterality: N/A;     Current Outpatient Medications  Medication Sig Dispense Refill  . acetaminophen (TYLENOL) 500 MG tablet Take 500 mg by mouth daily.    Marland Kitchen albuterol (PROVENTIL HFA;VENTOLIN HFA) 108 (90 Base) MCG/ACT inhaler Inhale 2 puffs into the lungs every 6 (six) hours as needed for wheezing or shortness of breath. 1 Inhaler 5  . budesonide-formoterol (SYMBICORT) 160-4.5 MCG/ACT inhaler Inhale 2 puffs into the lungs 2 (two) times daily. 10.2 Inhaler 5  . chlorzoxazone (PARAFON) 500 MG tablet Take 500 mg by mouth 2 (two) times daily.    . Dentifrices (MI PASTE PLUS) PSTE Place 1 application onto  teeth at bedtime.    . diclofenac sodium (VOLTAREN) 1 % GEL Apply 1 application topically 2 (two) times daily.    . diphenhydramine-acetaminophen (TYLENOL PM) 25-500 MG TABS tablet Take 1 tablet by mouth at bedtime.    . ferrous sulfate 324 (65 Fe) MG TBEC Take 324 mg by mouth 2 (two) times daily.    . furosemide (LASIX) 20 MG tablet Take 10 mg by mouth daily. Take half a tablet as needed for swelling.    . lidocaine (XYLOCAINE) 5 % ointment Apply 1 application topically 2 (two) times daily.     Marland Kitchen LINZESS 72 MCG capsule Take 72 mcg by mouth daily as needed (constipation).   4  . loratadine (CLARITIN) 10 MG tablet Take 10 mg by mouth daily as needed for allergies.    . Multiple  Vitamins-Minerals (CENTRUM SILVER ADULT 50+ PO) Take 1 tablet by mouth daily with breakfast.    . Nutritional Supp - Diet Aids (META APPETITE CONTROL) POWD Take 1 Scoop by mouth daily.    Marland Kitchen omeprazole (PRILOSEC) 20 MG capsule Take 20 mg by mouth daily.     . potassium chloride SA (K-DUR,KLOR-CON) 20 MEQ tablet Take 1 tablet (20 mEq total) by mouth daily as needed. TAKE ONLY if taking furosemide (lasix) (Patient taking differently: Take 20 mEq by mouth daily. TAKE ONLY if taking furosemide (lasix) 30 tablet 11  . PREVIDENT 5000 BOOSTER PLUS 1.1 % PSTE Place 1 application onto teeth at bedtime.   6  . rosuvastatin (CRESTOR) 5 MG tablet Take 5 mg by mouth at bedtime.     . valsartan (DIOVAN) 160 MG tablet TAKE 1 TABLET BY MOUTH EVERY DAY 30 tablet 5  . warfarin (COUMADIN) 7.5 MG tablet Take 3.75-7.5 mg by mouth See admin instructions. Take 7.5mg  at night on Sunday, Tuesday, Wednesday, Thursday, and Saturday. 3.75mg  on Monday and Fridays  1  . zolpidem (AMBIEN) 10 MG tablet Take 10 mg by mouth at bedtime.     Marland Kitchen amoxicillin (AMOXIL) 500 MG capsule Take 2,000 mg by mouth See admin instructions. Take 2000 mg 1 hour prior to dental procedures  2   No current facility-administered medications for this visit.     Allergies:    Patient has no known allergies.    Social History:  The patient  reports that he has never smoked. He has never used smokeless tobacco. He reports current alcohol use. He reports that he does not use drugs.   Family History:  The patient's family history includes Heart disease in his father.    ROS:  Please see the history of present illness.   Otherwise, review of systems are positive for intentional weight loss.   All other systems are reviewed and negative.    PHYSICAL EXAM: VS:  BP 126/66   Pulse 68   Ht 6' (1.829 m)   Wt 231 lb 12.8 oz (105.1 kg)   SpO2 97%   BMI 31.44 kg/m  , BMI Body mass index is 31.44 kg/m. GEN: Well nourished, well developed, in no acute distress  HEENT: normal  Neck: no JVD, carotid bruits, or masses Cardiac: RRR; crisp S2, 2/6 systolic murmur, no rubs, or gallops, tr edema  Respiratory:  clear to auscultation bilaterally, normal work of breathing GI: soft, nontender, nondistended, + BS MS: no deformity or atrophy  Skin: warm and dry, no rash; discolored legs due to vein problems.  Neuro:  Strength and sensation are intact Psych: euthymic mood, full affect    Recent Labs: 06/05/2018: BUN 28; Creatinine, Ser 0.88; Hemoglobin 13.3; Platelets 229; Potassium 4.9; Sodium 140   Lipid Panel    Component Value Date/Time   CHOL 110 06/23/2017 0904   TRIG 105 06/23/2017 0904   HDL 39 (L) 06/23/2017 0904   CHOLHDL 2.8 06/23/2017 0904   CHOLHDL 3.7 03/08/2016 0837   VLDL 31 (H) 03/08/2016 0837   LDLCALC 50 06/23/2017 0904     Other studies Reviewed: Additional studies/ records that were reviewed today with results demonstrating: echos reviewed.   ASSESSMENT AND PLAN:  1. S/p AVR: moderate AI and moderate to severe AS.  Using SBE prophylaxis. No sx of severe AS.  Check echo in 7/20. 2. Anticoagulated: No bleeding problems.  INR 3.1 today in the office.  He will hold Coumadin from now until his injection which  is scheduled on 21 January.  I will  defer to the surgeon whether they want to recheck his INR again prior to the procedure. 3. May need another back injection.  Tolerated hernia surgery.   4. Obesity: He has lost about 30 lbs. He tries to stick to 2100 calories per day.  He has been walking 13K steps daily.  Eating less TV dinner type items.  5. Hyperlipidemia: LDL 84 in 10/19 at [facility].    Current medicines are reviewed at length with the patient today.  The patient concerns regarding his medicines were addressed.  The following changes have been made:  No change  Labs/ tests ordered today include:  No orders of the defined types were placed in this encounter.   Recommend 150 minutes/week of aerobic exercise Low fat, low carb, high fiber diet recommended  Disposition:   FU in 6 months with echo   Signed, Larae Grooms, MD  07/19/2018 8:27 AM    [facility] [address], [place], [place]  [address] Phone: [phone]; Fax: [phone]

## 2018-10-11 ENCOUNTER — Other Ambulatory Visit: Payer: Self-pay | Admitting: Emergency Medicine

## 2018-10-15 ENCOUNTER — Ambulatory Visit: Payer: 59 | Admitting: Emergency Medicine

## 2018-10-16 ENCOUNTER — Ambulatory Visit: Payer: 59 | Admitting: Emergency Medicine

## 2018-11-16 ENCOUNTER — Telehealth: Payer: Self-pay | Admitting: Cardiology

## 2018-11-16 NOTE — Telephone Encounter (Signed)
Triage- Please contact the procedure provider, Dr. Lorie Apley office, and place appropriate preop clearance request into the preop pool. This needs to be done ASAP so warfarin can be held in time for procedure on Friday. (pt says that Dr. Lorie Apley office says they sent a preop request several times with no response).     I will also route to preop pool for follow up and to preop pharmacy to begin looking at holding warfarin.     ----- Message -----  From: Loren Racer, LPN  Sent: 2/82/0813  1:55 PM EDT  To: [facility]  Subject: FW: Non-Urgent Medical Question             ----- Message -----  From: Jeffery Whitehead  Sent: 11/16/2018  1:52 PM EDT  To: [address] Triage  Subject: Non-Urgent Medical Question             Dr. Irish Lack,  I have an Endoscopy scheduled for next Friday, May 22. It is with Dr. Juanita Craver at [facility]. I am wondering if I shpuld go off Warfarin before this procedure? I have beed tole by Dr. Lorie Apley office that they forwarded you a notice, but have not heard back. I called your office and was told that they did not receive a request. I called back Dr. Lorie Apley office today and ask them to call and verify your fax number and let me know what I need to do with my Warfarin.  Thanks,    Jeffery Whitehead

## 2018-11-19 ENCOUNTER — Telehealth: Payer: Self-pay | Admitting: Interventional Cardiology

## 2018-11-19 NOTE — Telephone Encounter (Signed)
Patient with diagnosis of MECHANICAL AVR on WARFARIN for anticoagulation.    *Anticoagualtion not monitor or follow by our clinic*  Per office protocol, patient can hold WARFARIN for 5 days prior to procedure.   Patient will not need bridging with Lovenox (enoxaparin) around procedure.  *Please forward recommendation to physician managing warfarin as well for their records.*

## 2018-11-19 NOTE — Telephone Encounter (Signed)
This clearance has already been addressed (see phone note from 11/16/18). It does not appear that we manage warfarin so message will need to be sent to managing practice as well.

## 2018-11-19 NOTE — Telephone Encounter (Signed)
   Primary Cardiologist: Larae Grooms, MD  Chart reviewed as part of pre-operative protocol coverage. Patient was contacted 11/19/2018 in reference to pre-operative risk assessment for pending surgery as outlined below.  Artyom Stencel was last seen on 07/19/18 by Dr. Irish Lack.  Since that day, Alwaleed Obeso has done well. He denies any new or worsening cardiac symptoms.  Per our pharmacy staff: Patient with diagnosis of MECHANICAL AVR on WARFARIN for anticoagulation.    *Anticoagualtion not monitor or follow by our clinic*  Per office protocol, patient can hold WARFARIN for 5 days prior to procedure.   Patient will not need bridging with Lovenox (enoxaparin) around procedure.  His coumadin is managed by Dr. Kathryne Eriksson, [place].   Therefore, based on ACC/AHA guidelines, the patient would be at acceptable risk for the planned procedure without further cardiovascular testing.   I will route this recommendation to the requesting party via Epic fax function and remove from pre-op pool.  Please call with questions.  Tami Lin Duke, PA 11/19/2018, 11:56 AM

## 2018-11-19 NOTE — Telephone Encounter (Signed)
   [facility] Pre-operative Risk Assessment    Request for surgical clearance:  1. What type of surgery is being performed?  EGD and Colonoscopy   2. When is this surgery scheduled?  11/23/2018   3. What type of clearance is required (medical clearance vs. Pharmacy clearance to hold med vs. Both)?  Both  4. Are there any medications that need to be held prior to surgery and how long? Warfarin   5. Practice name and name of physician performing surgery?  [facility].   6. What is your office phone number? [phone]    7.   What is your office fax number? [phone]  8.   Anesthesia type (None, local, MAC, general) ?  Propofol    _________________________________________________________________   (provider comments below)

## 2018-12-03 NOTE — Telephone Encounter (Signed)
I am covering pre-op box today and it appears this clearance has already been addressed in separate phone notes. Will remove from pre-op box. Rodd Heft PA-C

## 2018-12-06 DIAGNOSIS — D509 Iron deficiency anemia, unspecified: Secondary | ICD-10-CM | POA: Diagnosis not present

## 2018-12-06 DIAGNOSIS — K5904 Chronic idiopathic constipation: Secondary | ICD-10-CM | POA: Diagnosis not present

## 2018-12-06 DIAGNOSIS — K219 Gastro-esophageal reflux disease without esophagitis: Secondary | ICD-10-CM | POA: Diagnosis not present

## 2018-12-06 DIAGNOSIS — R195 Other fecal abnormalities: Secondary | ICD-10-CM | POA: Diagnosis not present

## 2018-12-07 ENCOUNTER — Telehealth (HOSPITAL_COMMUNITY): Payer: Self-pay | Admitting: *Deleted

## 2018-12-07 NOTE — Telephone Encounter (Signed)
COVID-19 Pre-Screening Questions:  . Do you currently have a fever? NO (yes = cancel and refer to pcp for e-visit) . Have you recently travelled on a cruise, internationally, or to [place], [place], [place], [place], [place], or [place], [place] ([place]) ? NO (yes = cancel, stay home, monitor symptoms, and contact pcp or initiate e-visit if symptoms develop) . Have you been in contact with someone that is currently pending confirmation of Covid19 testing or has been confirmed to have the Covid19 virus?  NO (yes = cancel, stay home, away from tested individual, monitor symptoms, and contact pcp or initiate e-visit if symptoms develop) . Are you currently experiencing fatigue or cough? NO (yes = pt should be prepared to have a mask placed at the time of their visit).   . Reiterated no additional visitors. . Arrive no earlier than 15 minutes before appointment time. . Please bring own mask.  Jeffery Whitehead 

## 2018-12-10 ENCOUNTER — Other Ambulatory Visit: Payer: Self-pay

## 2018-12-10 ENCOUNTER — Ambulatory Visit (HOSPITAL_COMMUNITY): Payer: Medicare Other | Attending: Cardiology

## 2018-12-10 DIAGNOSIS — I350 Nonrheumatic aortic (valve) stenosis: Secondary | ICD-10-CM | POA: Diagnosis not present

## 2018-12-10 DIAGNOSIS — Z952 Presence of prosthetic heart valve: Secondary | ICD-10-CM | POA: Diagnosis not present

## 2018-12-10 DIAGNOSIS — I351 Nonrheumatic aortic (valve) insufficiency: Secondary | ICD-10-CM | POA: Diagnosis not present

## 2018-12-14 NOTE — Telephone Encounter (Signed)
Patient was called with echo results.

## 2018-12-17 DIAGNOSIS — R634 Abnormal weight loss: Secondary | ICD-10-CM | POA: Diagnosis not present

## 2018-12-17 DIAGNOSIS — R5382 Chronic fatigue, unspecified: Secondary | ICD-10-CM | POA: Diagnosis not present

## 2018-12-17 DIAGNOSIS — M2550 Pain in unspecified joint: Secondary | ICD-10-CM | POA: Diagnosis not present

## 2018-12-20 ENCOUNTER — Telehealth: Payer: Self-pay | Admitting: Interventional Cardiology

## 2018-12-20 NOTE — Telephone Encounter (Signed)
Patient is calling about his Echo results, he has been discussing with [place].

## 2018-12-21 NOTE — Telephone Encounter (Signed)
Called and made the patient aware that per Dr. Irish Lack, based on the velocity of blood through the valve, it has narrowed more and is in the severe category. Made the patient aware that there is only an indication for a valve replacement when he is symptomatic. Made the patient aware that Dr. Irish Lack will review his case with the doctors who do the valve replacements. Made patient aware that we will continue to monitor closely for now and instructed him to let us know if he develops any Sx such as SOB, chest pain, lightheadedness, dizziness, fatigue, palpitations, syncope, or any other Sx. Patient verbalized understanding and thanked me for the call.

## 2018-12-23 ENCOUNTER — Other Ambulatory Visit: Payer: Self-pay | Admitting: Emergency Medicine

## 2018-12-24 ENCOUNTER — Telehealth: Payer: Self-pay | Admitting: Emergency Medicine

## 2018-12-24 MED ORDER — BUDESONIDE-FORMOTEROL FUMARATE 160-4.5 MCG/ACT IN AERO: 2.0000 | INHALATION_SPRAY | Freq: Two times a day (BID) | RESPIRATORY_TRACT | 0 refills | Status: DC

## 2018-12-24 NOTE — Telephone Encounter (Signed)
Spoke with pt, advised that I sent in Symbicort to his pharmacy but was advised to schedule an appt since it has been a year since he folloed up. Pt understood and agreed. Appt with Dr. Lamonte Sakai 01/01/2019 at 12pm. Nothing further is needed.  Patient Instructions by [name], MD at 10/10/2017 9:30 AM Author: [name], MD Author Type: Physician Filed: 10/10/2017 9:58 AM  Note Status: Addendum Cosign: Cosign Not Required Encounter Date: 10/10/2017  Editor: [name], MD (Physician)  Prior Versions: 1. [name], MD (Physician) at 10/10/2017 9:53 AM - Signed    Please continue your symbicort as you are using it. Remember to rinse and gargle after using. Keep albuterol available to use 2 puffs if needed for shortness of breath, wheezing, chest tightness. Agree with using Benadryl, loratadine, or Zyrtec to manage her allergy symptoms. Continue omeprazole 20 mg daily. We will continue your valsartan as ordered. Continue your CPAP every night.  Let us know if you have issues with equipment. Follow with Dr Lamonte Sakai in 12 months or sooner if you have any problems

## 2018-12-30 ENCOUNTER — Other Ambulatory Visit: Payer: Self-pay | Admitting: Emergency Medicine

## 2018-12-31 DIAGNOSIS — K4090 Unilateral inguinal hernia, without obstruction or gangrene, not specified as recurrent: Secondary | ICD-10-CM | POA: Diagnosis not present

## 2018-12-31 DIAGNOSIS — H5213 Myopia, bilateral: Secondary | ICD-10-CM | POA: Diagnosis not present

## 2018-12-31 DIAGNOSIS — H2513 Age-related nuclear cataract, bilateral: Secondary | ICD-10-CM | POA: Diagnosis not present

## 2019-01-01 ENCOUNTER — Encounter: Payer: Self-pay | Admitting: Emergency Medicine

## 2019-01-01 ENCOUNTER — Ambulatory Visit (INDEPENDENT_AMBULATORY_CARE_PROVIDER_SITE_OTHER): Payer: Medicare Other | Admitting: Emergency Medicine

## 2019-01-01 ENCOUNTER — Other Ambulatory Visit: Payer: Self-pay

## 2019-01-01 ENCOUNTER — Ambulatory Visit (INDEPENDENT_AMBULATORY_CARE_PROVIDER_SITE_OTHER): Payer: Medicare Other

## 2019-01-01 VITALS — BP 126/88 | HR 73 | Ht 72.0 in | Wt 192.0 lb

## 2019-01-01 DIAGNOSIS — J4540 Moderate persistent asthma, uncomplicated: Secondary | ICD-10-CM

## 2019-01-01 DIAGNOSIS — Z5181 Encounter for therapeutic drug level monitoring: Secondary | ICD-10-CM | POA: Diagnosis not present

## 2019-01-01 DIAGNOSIS — R059 Cough, unspecified: Secondary | ICD-10-CM

## 2019-01-01 DIAGNOSIS — M0600 Rheumatoid arthritis without rheumatoid factor, unspecified site: Secondary | ICD-10-CM

## 2019-01-01 DIAGNOSIS — R05 Cough: Secondary | ICD-10-CM | POA: Diagnosis not present

## 2019-01-01 DIAGNOSIS — G4733 Obstructive sleep apnea (adult) (pediatric): Secondary | ICD-10-CM | POA: Diagnosis not present

## 2019-01-01 MED ORDER — VALSARTAN 160 MG PO TABS: 160.0000 mg | ORAL_TABLET | Freq: Every day | ORAL | 3 refills | Status: DC

## 2019-01-01 NOTE — Patient Instructions (Addendum)
We will try stopping Symbicort to see if you tolerate this.  Keep albuterol available to use 2 puffs if needed for shortness of breath, chest tightness, wheezing Let us know if your breathing changes significantly off of the Symbicort Continue your CPAP every night CXR today to establish baseline since you are about to start methotrexate Follow with Dr Lamonte Sakai in 6 months or sooner if you have any problems

## 2019-01-01 NOTE — Assessment & Plan Note (Signed)
Being followed by rheumatology.  He is completing a burst of prednisone and there is a plan to start him on methotrexate.  I will check a baseline chest x-ray today, repeat probably annually or if he develops any change in dyspnea.

## 2019-01-01 NOTE — Assessment & Plan Note (Signed)
Suspect impacted by his GERD.  He was just started on Dexilant in addition to his omeprazole.

## 2019-01-01 NOTE — Progress Notes (Signed)
Subjective:    Patient ID: Jeffery Whitehead, male    DOB: 1955-12-06, 63 y.o.   MRN: [id number]  Asthma He complains of shortness of breath. There is no cough or wheezing. Pertinent negatives include no ear pain, fever, headaches, postnasal drip, rhinorrhea, sneezing, sore throat or trouble swallowing. His past medical history is significant for asthma.  Shortness of Breath Pertinent negatives include no ear pain, fever, headaches, leg swelling, rash, rhinorrhea, sore throat, vomiting or wheezing. His past medical history is significant for asthma.   ROV 08/29/17 --63 year old man, never smoker, with a history of moderate obstruction by pulmonary function testing.  I followed him for this and also for obstructive sleep apnea.  He has been trialed on Symbicort in the past but unclear that this really helped him.  He remains on Symbicort. Good CPAP compliance. He feels that it helps him - sleeps better. Nasal mask. The machine is in good repair, has the right components. No longer using chinstrap.  Download information 1/27 to 08/28/17 shows 93% of the nights he uses his CPAP for greater than 4 hours.  He is on an AutoSet range between 5 and 15, is mean pressure is 9-10.  Good control of his obstructive events.  He notes that he has had some exertional dyspnea starting about 2 weeks ago. He has gained about 15 lbs over the last year. Exercise tolerance has decreased. He is coughing more, non-productive. He is on lisinopril since last fall. He is using a flow meter - averaging a little less, high 400's  ROV 10/10/17 --patient follows up today for history of obstructive sleep apnea, asthma and a never smoker.  At our last visit he was having more cough.  We decided to change his lisinopril to an ARB.  He is on omeprazole 20 mg daily, not currently on an allergy regimen. He is on symbicort, still has some exercise dyspnea. Uses albuterol about few times a week. He is on benadryl right now, off loratadine.    Since last time he has been dx with an inguinal hernia, also now has developed shingles.   CPAP download information available between 3/10 and 10/09/17.  He used the device for greater than 4 hours 80% of the nights with good event control on an AutoSet mode between 5 and 15 cmH2O  ROV 01/01/2019 --63 year old gentleman with a history of OSA and asthma.  He is also had a history of chronic cough that has been in part influenced by GERD, and ACE inhibitor (which we stopped).  We have been managing him on Symbicort.  He has good compliance with his CPAP.  A download today shows that he used it 93% of days, 67% greater than 4 hours.  He is on an AutoSet mode 5-15 cmH2O. Uses a nasal mask, machine is in good repair. Does not have any dyspnea. No wheeze or cough. Minimal albuterol use.   He is followed by Dr Irish Lack, has a hx AVR, endocarditis, AV calcifications.  He is being seen by Rheum for suspected sero-negative RA, is currently on prednisone. Helping his joints, no real change in his breathing. He is planning to start MTX.  Dr Collene Mares recently started him on dexilant + omeprazole.     Review of Systems  Constitutional: Negative for fever and unexpected weight change.  HENT: Negative for congestion, dental problem, ear pain, nosebleeds, postnasal drip, rhinorrhea, sinus pressure, sneezing, sore throat and trouble swallowing.   Eyes: Negative for redness and itching.  Respiratory: Positive  for shortness of breath. Negative for cough, chest tightness and wheezing.   Cardiovascular: Negative for palpitations and leg swelling.  Gastrointestinal: Negative for nausea and vomiting.  Genitourinary: Negative for dysuria.  Musculoskeletal: Negative for joint swelling.  Skin: Negative for rash.  Neurological: Negative for headaches.  Hematological: Does not bruise/bleed easily.  Psychiatric/Behavioral: Negative for dysphoric mood. The patient is not nervous/anxious.        Objective:   Physical Exam   Vitals:   01/01/19 1211  BP: 126/88  Pulse: 73  SpO2: 99%  Weight: 192 lb (87.1 kg)  Height: 6' (1.829 m)   obese, in no distress,  normal affect  ENT: No lesions,  mouth clear,  oropharynx clear, no postnasal drip  Neck: No JVD, no stridor  Lungs: No use of accessory muscles, clear without rales or rhonchi  Cardiovascular: RRR, systolic M with an S2 click, trace pretibial edema  Musculoskeletal: No deformities, no cyanosis or clubbing  Neuro: alert, non focal  Skin: Warm, no lesions or rashes     Assessment & Plan:  OSA (obstructive sleep apnea) Good compliance and good benefit from his CPAP.  He feels better rested, has more energy, sleeps through the night.  He does occasionally take it off when he wakes up very early.  67% compliance greater than 4 hours nightly.  It is in good repair, continue same.  Asthma, chronic, moderate persistent, uncomplicated He is only using Symbicort reliably in the evening, often misses his morning dose.  In absence of any recent exacerbation and with minimal symptoms I think it is reasonable to try stopping the Symbicort altogether and see how he does.  He will call us if he loses ground, has to start using his albuterol more frequently.  Cough Suspect impacted by his GERD.  He was just started on Dexilant in addition to his omeprazole.  Seronegative rheumatoid arthritis ([place]) Being followed by rheumatology.  He is completing a burst of prednisone and there is a plan to start him on methotrexate.  I will check a baseline chest x-ray today, repeat probably annually or if he develops any change in dyspnea.  Baltazar Apo, MD, PhD 01/01/2019, 12:47 PM [facility] [phone] or if no answer [phone]

## 2019-01-01 NOTE — Assessment & Plan Note (Signed)
Good compliance and good benefit from his CPAP.  He feels better rested, has more energy, sleeps through the night.  He does occasionally take it off when he wakes up very early.  67% compliance greater than 4 hours nightly.  It is in good repair, continue same.

## 2019-01-01 NOTE — Assessment & Plan Note (Signed)
He is only using Symbicort reliably in the evening, often misses his morning dose.  In absence of any recent exacerbation and with minimal symptoms I think it is reasonable to try stopping the Symbicort altogether and see how he does.  He will call us if he loses ground, has to start using his albuterol more frequently.

## 2019-01-02 ENCOUNTER — Ambulatory Visit: Payer: Self-pay | Admitting: Surgery

## 2019-01-02 ENCOUNTER — Telehealth: Payer: Self-pay | Admitting: *Deleted

## 2019-01-02 NOTE — H&P (Signed)
Jeffery Whitehead Documented: 12/31/2018 2:28 PM Location: [facility] Patient #: [id number] DOB: 1955/10/20 Married / Language: English / Race: White Male   History of Present Illness [name] PA C; 12/31/2018 5:17 PM) The patient is a 63 year old male who presents with an inguinal hernia.He is status post laparoscopic ventral hernia repair with mesh on 06/13/18 by Dr. Johney Maine. He had a normal postop course and has been doing well until about 2 days ago when he noticed a bulge to his right inguinal region. He states initially this was very painful but over the past 2 days the swelling and pain have decreased. He is not sure if the bulge is present today. He denies any redness over the area. Denies drainage. Denies fevers. He states he has started prednisone due to arthritis, given by his dermatologist.  History of aortic valve replacement in 1983 with a McNair valve, followed by Dr. Larae Grooms, [place]. He has been on Coumadin since that time. Last echo was on 12/10/18 showed aortic valve narrowing. There is severe calcification of the aortic valve. Aortic valve regurg is moderate to severe. He was told to contact the cardiologist if he becomes symptomatic, but to otherwise monitor. He is planning to get a second opinion at [facility] clinic at the end of July.   Allergies (Tanisha A. Owens Shark, [place]; 12/31/2018 3:02 PM) No Known Drug Allergies  [10/02/2017]: Allergies Reconciled   Medication History (Tanisha A. Owens Shark, RMA; 12/31/2018 3:02 PM) Gabapentin (300MG  Capsule, 1 (one) Oral two times daily, Taken starting 11/07/2018) Active. (May increase to 600 mg three times a day) Warfarin Sodium (7.5MG  Tablet, Oral) Active. Rosuvastatin Calcium (5MG  Tablet, Oral) Active. Symbicort (160-4.5MCG/ACT Aerosol, Inhalation) Active. Zolpidem Tartrate (10MG  Tablet, Oral) Active. ValACYclovir HCl (1GM Tablet, Oral) Active. Klor-Con M20 [facility] Tablet ER, Oral) Active.  Methocarbamol (750MG  Tablet, Oral) Active. Medications Reconciled    Review of Systems [name] PA C; 12/31/2018 5:14 PM) General Present- Appetite Loss and Fatigue. Not Present- Chills, Fever, Night Sweats, Weight Gain and Weight Loss. Skin Present- Rash. Not Present- Change in Wart/Mole, Dryness, Hives, Jaundice, New Lesions, Non-Healing Wounds and Ulcer. HEENT Present- Hoarseness, Seasonal Allergies and Wears glasses/contact lenses. Not Present- Earache, Hearing Loss, Nose Bleed, Oral Ulcers, Ringing in the Ears, Sinus Pain, Sore Throat, Visual Disturbances and Yellow Eyes. Respiratory Present- Chronic Cough, Snoring and Wheezing. Not Present- Bloody sputum and Difficulty Breathing. Breast Not Present- Breast Mass, Breast Pain, Nipple Discharge and Skin Changes. Cardiovascular Present- Leg Cramps, Shortness of Breath and Swelling of Extremities. Not Present- Chest Pain, Difficulty Breathing Lying Down, Palpitations and Rapid Heart Rate. Gastrointestinal Present- Abdominal Pain. Not Present- Bloating, Bloody Stool, Change in Bowel Habits, Chronic diarrhea, Constipation, Difficulty Swallowing, Excessive gas, Gets full quickly at meals, Hemorrhoids, Indigestion, Nausea, Rectal Pain and Vomiting. Musculoskeletal Present- Back Pain, Joint Pain, Joint Stiffness, Muscle Pain, Muscle Weakness and Swelling of Extremities. Neurological Present- Weakness. Not Present- Decreased Memory, Fainting, Headaches, Numbness, Seizures, Tingling, Tremor and Trouble walking. Psychiatric Present- Depression. Not Present- Anxiety, Bipolar, Change in Sleep Pattern, Fearful and Frequent crying. Endocrine Not Present- Cold Intolerance, Excessive Hunger, Hair Changes, Heat Intolerance and New Diabetes. Hematology Present- Blood Thinners. Not Present- Easy Bruising, Excessive bleeding, Gland problems, HIV and Persistent Infections.  Vitals (Tanisha A. Brown RMA; 12/31/2018 3:02 PM) 12/31/2018 3:02 PM Weight: 194.8  lb Height: 72in Body Surface Area: 2.11 m Body Mass Index: 26.42 kg/m  Temp.: 98.75F  Pulse: 75 (Regular)  BP: 122/74(Sitting, Left  Arm, Standard)       Physical Exam [name] PA C; 12/31/2018 5:15 PM) The physical exam findings are as follows: Note: GENERAL: Well-developed, well nourished male in no acute distress  EYES: No scleral icterus Pupils equal, lids normal  EXTERNAL EARS: Intact, no masses or lesions EXTERNAL NOSE: Intact, no masses or lesions MOUTH: Lips - no lesions Dentition - normal for age  RESPIRATORY: Normal effort, no use of accessory muscles  MUSCULOSKELETAL: Normal gait Grossly normal ROM upper extremities Grossly normal ROM lower extremities  SKIN: Warm and dry Not diaphoretic  PSYCHIATRIC: Normal judgement and insight Normal mood and affect Alert, oriented x 3  Male Genitourinary Note: When standing, there is a 3 cm x 3 cm bulge superior and medial to his inguinal crease. Easily reducible. Hernia felt with valsalva When supine, the mass is not present No sign of incarceration, no redness, not very tender     Assessment & Plan [name] PA C; 12/31/2018 5:17 PM)  INGUINAL HERNIA, RIGHT (K40.90) Impression: He is status post ventral/umbilical hernia repair with mesh in December 2019 by Dr. Johney Maine. On exam today, it appears that he has a right inguinal hernia. Dr. Johney Maine also examined the patient. He recommended that we get cardiac clearance before moving on with surgery. We reviewed the signs and symptoms of incarceration/strangulation. At this time, the hernia is easily reducible and not causing many symptoms. He is aware that the hernia may increase in size over time. We will touch base his cardiologist and then contact the patient with steps moving forward. He is aware that he will need to hold his Coumadin before surgery.  Current Plans Please follow up in the office with the surgeon that performed your operation  for continued postoperative management - You have a right inguinal hernia - Dr. Johney Maine with a cardiology clearance before operating on this area - We will touch base with the cardiologist - If you develop increased pain, redness, nausea, vomiting, or inability to pass gas or stools, call the office or go to the ER to make sure you do not have an incarcerated or strangulated hernia  Signed electronically by [name], PA C (12/31/2018 5:18 PM)  Co-signed electronically by Adin Hector MD (01/02/2019 6:48 AM)  Patient with history of recent ventral hernia repair with periumbilical mesh now with inguinal hernia on right side.  Some laxity in the left side.  Recommended laparoscopic exploration.  Most likely a Tapp approach.  Patient wishes to get second opinion on his chronic artificial heart valve at [facility] clinic given some calcifications noted.  Will wait for cardiac clearance and make sure it is safe to hold warfarin perioperatively need for Lovenox bridge per cardiology.  The anatomy & physiology of the abdominal wall and pelvic floor was discussed.  The pathophysiology of hernias in the inguinal and pelvic region was discussed.  Natural history risks such as progressive enlargement, pain, incarceration, and strangulation was discussed.   Contributors to complications such as smoking, obesity, diabetes, prior surgery, etc were discussed.    I feel the risks of no intervention will lead to serious problems that outweigh the operative risks; therefore, I recommended surgery to reduce and repair the hernia.  I explained laparoscopic techniques with possible need for an open approach.  I noted usual use of mesh to patch and/or buttress hernia repair  Risks such as bleeding, infection, abscess, need for further treatment, injury to other organs, need for repair of tissues /  organs, stroke, heart attack, death, and other risks were discussed.  I noted a good likelihood this will help address the  problem.   Goals of post-operative recovery were discussed as well.  Possibility that this will not correct all symptoms was explained.  I stressed the importance of low-impact activity, aggressive pain control, avoiding constipation, & not pushing through pain to minimize risk of post-operative chronic pain or injury. Possibility of reherniation was discussed.  We will work to minimize complications.     An educational handout further explaining the pathology & treatment options was given as well.  Questions were answered.  The patient expresses understanding & wishes to proceed with surgery.  I recommended obtaining preoperative cardiac clearance.  I am concerned about the health of the patient and the ability to tolerate the operation.  Therefore, we will request clearance by cardiology to better assess operative risk & see if a reevaluation, further workup, adjustment to medications, etc is needed.   Adin Hector, MD, FACS, MASCRS Gastrointestinal and Minimally Invasive Surgery    [address], [place], [place] [address] [phone] Main / Paging [phone] Fax

## 2019-01-02 NOTE — Telephone Encounter (Addendum)
   [facility] Pre-operative Risk Assessment    Request for surgical clearance:  1. What type of surgery is being performed? Lap. Right and poss. left inguinal hernia repair with mesh  2. When is this surgery scheduled? ASAP   3. What type of clearance is required (medical clearance vs. Pharmacy clearance to hold med vs. Both)? Both  4. Are there any medications that need to be held prior to surgery and how long? Warfarin   5. Practice name and name of physician performing surgery? [facility]/Dr Gross  6. What is your office phone number [phone]    7.   What is your office fax number [phone]  8.   Anesthesia type (None, local, MAC, general) ? General   Jeffery Whitehead L 01/02/2019, 4:31 PM  _________________________________________________________________   (provider comments below)

## 2019-01-03 NOTE — Telephone Encounter (Signed)
Sent message to TAVR team for some guidance.

## 2019-01-03 NOTE — Telephone Encounter (Signed)
Dr Irish Lack this patient needs hernia repair with mesh.  He was cleared in June for an endoscopy and cleared to hold Warfarin for 5 days.  He has an AVR with severe AS and this surgery is higher risk so I wanted to check with you before granting clearance.  Kerin Ransom PA-C 01/03/2019 9:18 AM

## 2019-01-08 NOTE — Telephone Encounter (Signed)
Per Dr. Irish Lack, forward message to TAVR team for some guidance.   This is a patient who had a mechanical aortic valve placed in the 1980s, INR managed by Dr. Kathryne Eriksson of [facility]. Recently cleared in May for colonoscopy and EGD without lovenox bridging. Recent echo in June suggest worsening aortic stenosis (moderate to severe). Dr. Irish Lack wish to see if this will potentially increase his surgical risk for hernia repair.   Talking with the patient today, he is completely asymptomatic. He has been walking about 5-6 miles on a daily basis for the past year, and this has not changed recently despite the increased building and pain from the hernia site. He denies any recent exertional chest pain or shortness of breath.

## 2019-01-08 NOTE — Telephone Encounter (Signed)
Follow message:    Nurse from dr. Johney Maine calling concering patient medical clearance please Jeffery Whitehead at [phone]

## 2019-01-08 NOTE — Telephone Encounter (Signed)
Pt takes warfarin for mechanical AVR. Ok to hold warfarin for 5 days prior to procedure, does not require bridging in absence of other risk factors. Please forward to MD managing warfarin as an FYI - Dr Kathryne Eriksson, [facility] in [place].

## 2019-01-09 ENCOUNTER — Telehealth: Payer: Self-pay

## 2019-01-09 DIAGNOSIS — Z7901 Long term (current) use of anticoagulants: Secondary | ICD-10-CM | POA: Diagnosis not present

## 2019-01-09 DIAGNOSIS — Z952 Presence of prosthetic heart valve: Secondary | ICD-10-CM | POA: Diagnosis not present

## 2019-01-09 DIAGNOSIS — I350 Nonrheumatic aortic (valve) stenosis: Secondary | ICD-10-CM

## 2019-01-09 DIAGNOSIS — Z5181 Encounter for therapeutic drug level monitoring: Secondary | ICD-10-CM | POA: Diagnosis not present

## 2019-01-09 NOTE — Telephone Encounter (Signed)
The heart valve team is reviewed the patient's case.  He has significant dysfunction of his mechanical aortic valve prosthesis with severe aortic stenosis and moderately severe aortic insufficiency.  These findings are progressive from previous studies.  We have recommended a cardiac CTA to evaluate leaflet mobility as there is a high likelihood that he has a fixed mechanical leaflet of his bileaflet prosthesis.  Unless his surgery is emergent, it would be best to evaluate and treat his aortic valve disease first.

## 2019-01-09 NOTE — Telephone Encounter (Signed)
I have informed the patient regarding potential need to push back hernia repair. I talked to Dr. Burt Knack on the phone and also informed Dr. Johney Maine' nurse as well. I will reach out to the TAVR nurse Audie Pinto at Dr. Antionette Char recommendation to arrange cardiac CTA and CT surgery visit

## 2019-01-09 NOTE — Telephone Encounter (Signed)
Dr. Johney Maine, due to change in the patient's recent echocardiogram, our valve specialist reviewed his case, please see recommendation by Dr. Burt Knack. How urgent is his surgery? We plan to initiate workup for valve repair.

## 2019-01-09 NOTE — Telephone Encounter (Signed)
Called and made patient aware that Dr. Irish Lack had his colleague Dr. Burt Knack review his TEE and that he will need a redo AVR. Made patient aware that they feel that his valve should be done prior to his hernia surgery. Made patient aware that he will need Cardiac CT, L&RHC, and a referral to TCTS. Made patient aware that I will arrange these tests and referral. Made patient aware that I will be in contact with him in the near future to arrange after I get information below. Patient verbalized understanding and thanked me for the call.  Dr. Irish Lack, please advise:  1. Patient will need labs, EKG, covid testing and H&P prior to cath. Would you like to set up a visit prior to scheduling?  2. Patient is on coumadin, not managed by [place], but spoke with PharmD and he does not need bridging. How long would you like for him to hold his coumadin prior to his cath? 3-5 days?  3. Patient's HR usually runs 60-70s. What dose of metoprolol would you like to use for Cardiac CT?  Thanks

## 2019-01-09 NOTE — Telephone Encounter (Signed)
-----   Message from [name], MD sent at 01/09/2019 11:59 AM EDT ----- Thanks Ronalee Belts.   JV ----- Message ----- From: Sherren Mocha, MD Sent: 01/08/2019  11:15 AM EDT To: Barkley Boards, RN, [name], MD, #  Ulice Dash - we reviewed his case this morning. With a mechanical valve, there is high suspicion that he may have a fixed leaflet. This is obviously a surgical issue. Would do gated cardiac CTA, cath for coronary angio, and refer to TCTS. As bad as his valve has become, he should probably have this done before hernia surgery if it can wait.  ----- Message ----- From: Barkley Boards, RN Sent: 01/08/2019  10:04 AM EDT To: Sherren Mocha, MD, Cleon Gustin, RN  Hey,  Juluis Rainier Coop is suppose to message JV back about this pt. JV wanted to get the teams thoughts.   Coop had the valve team look at this pt's echo and they felt like the pt needs Cardiac CT, Cath and then TCTS referral.  JV should be talking to you about this pt but I wanted to keep you in loop.  Thanks, Lauren  ----- Message ----- From: Sherren Mocha, MD Sent: 01/06/2019   4:40 AM EDT To: Barkley Boards, RN, [name], MD, #  Ulice Dash - I can't get the images to pull up, but there's been a pretty big increase in peak velocity now 4.6 m/s compared to last year's study. His AI is reportedly worse now as well. While his only option is redo AVR with a mechanical valve, I would guess he will need that done sooner rather than later based on the echo changes. I'll copy Lauren to put him on our valve meeting list to discuss next week. ----- Message ----- From: [name], MD Sent: 01/03/2019   2:40 PM EDT To: Gaye Pollack, MD, Sherren Mocha, MD  Burnett Harry, [facility] patient had an AVR over 30 years ago.  He now has severe prosthetic valve AS by echo, but no symptoms.  He has some chronic back issues which limit his activity.  What options would he have for valve intervention?  He now needs semi-urgent  hernia repair, so not sure if we should work up his valve first before hernia repair.  THanks.

## 2019-01-13 NOTE — Telephone Encounter (Signed)
HOlding Coumadin 3 days prior should be fine for radial cath.   We can set up a virtual visit.  I would get CT before cardiac cath.  Can do metoprolol 50 mg x 1 before CT.

## 2019-01-14 MED ORDER — METOPROLOL TARTRATE 50 MG PO TABS: ORAL_TABLET | ORAL | 0 refills | Status: DC

## 2019-01-14 NOTE — Telephone Encounter (Signed)
Called and spoke to the patient. Made patient aware that Dr. Irish Lack is okay with him holding coumadin 3 days prior to cath. Made him aware that Dr. Irish Lack would like to have his Cardiac CT done prior to cath. Patient will also need virtual visit, labs, covid test, EKG, and TCTS referral. Made patient aware that I will arrange all of this once his Cardiac CT is scheduled. Reviewed instructions below for Cardiac CT with the patient. He verbalized understanding and thanked me for the call. Message sent to pre-cert and CT scheduler.   CARDIAC CT INSTRUCTIONS  Please arrive at the [facility] main entrance of [facility] at xx:xx AM (30-45 minutes prior to test start time)  [facility] [place], [place] [address] [phone]  Proceed to the [facility] Radiology Department (First Floor).  Please follow these instructions carefully (unless otherwise directed):   On the Night Before the Test: . Be sure to Drink plenty of water. . Do not consume any caffeinated/decaffeinated beverages or chocolate 12 hours prior to your test. . Do not take any antihistamines 12 hours prior to your test.   On the Day of the Test: . Drink plenty of water. Do not drink any water within one hour of the test. . Do not eat any food 4 hours prior to the test. . You may take your regular medications prior to the test.  . Take metoprolol (Lopressor) two hours prior to test. . HOLD Furosemide morning of the test.       After the Test: . Drink plenty of water. . After receiving IV contrast, you may experience a mild flushed feeling. This is normal. . On occasion, you may experience a mild rash up to 24 hours after the test. This is not dangerous. If this occurs, you can take Benadryl 25 mg and increase your fluid intake. . If you experience trouble breathing, this can be serious. If it is severe call 911 IMMEDIATELY. If it is mild, please call our office.

## 2019-01-16 ENCOUNTER — Other Ambulatory Visit: Payer: Self-pay

## 2019-01-16 ENCOUNTER — Telehealth (INDEPENDENT_AMBULATORY_CARE_PROVIDER_SITE_OTHER): Payer: Medicare Other | Admitting: Interventional Cardiology

## 2019-01-16 ENCOUNTER — Other Ambulatory Visit: Payer: Self-pay | Admitting: Interventional Cardiology

## 2019-01-16 ENCOUNTER — Telehealth (HOSPITAL_COMMUNITY): Payer: Self-pay | Admitting: Emergency Medicine

## 2019-01-16 ENCOUNTER — Other Ambulatory Visit: Payer: Medicare Other | Admitting: *Deleted

## 2019-01-16 ENCOUNTER — Encounter: Payer: Self-pay | Admitting: Interventional Cardiology

## 2019-01-16 VITALS — BP 110/62 | HR 61 | Ht 72.0 in | Wt 190.0 lb

## 2019-01-16 DIAGNOSIS — I350 Nonrheumatic aortic (valve) stenosis: Secondary | ICD-10-CM

## 2019-01-16 DIAGNOSIS — E782 Mixed hyperlipidemia: Secondary | ICD-10-CM

## 2019-01-16 DIAGNOSIS — Z952 Presence of prosthetic heart valve: Secondary | ICD-10-CM

## 2019-01-16 DIAGNOSIS — Z7901 Long term (current) use of anticoagulants: Secondary | ICD-10-CM

## 2019-01-16 LAB — BASIC METABOLIC PANEL
BUN/Creatinine Ratio: 36 — ABNORMAL HIGH (ref 10–24)
BUN: 31 mg/dL — ABNORMAL HIGH (ref 8–27)
CO2: 24 mmol/L (ref 20–29)
Calcium: 10.1 mg/dL (ref 8.6–10.2)
Chloride: 107 mmol/L — ABNORMAL HIGH (ref 96–106)
Creatinine, Ser: 0.85 mg/dL (ref 0.76–1.27)
GFR calc Af Amer: 107 mL/min/{1.73_m2} (ref >59)
GFR calc non Af Amer: 93 mL/min/{1.73_m2} (ref >59)
Glucose: 111 mg/dL — ABNORMAL HIGH (ref 65–99)
Potassium: 4.5 mmol/L (ref 3.5–5.2)
Sodium: 139 mmol/L (ref 134–144)

## 2019-01-16 LAB — CBC
Hematocrit: 39.4 % (ref 37.5–51.0)
Hemoglobin: 13.5 g/dL (ref 13.0–17.7)
MCH: 30.9 pg (ref 26.6–33.0)
MCHC: 34.3 g/dL (ref 31.5–35.7)
MCV: 90 fL (ref 79–97)
Platelets: 250 10*3/uL (ref 150–450)
RBC: 4.37 x10E6/uL (ref 4.14–5.80)
RDW: 15.4 % (ref 11.6–15.4)
WBC: 10.7 10*3/uL (ref 3.4–10.8)

## 2019-01-16 LAB — PROTIME-INR
INR: 2.8 — ABNORMAL HIGH (ref 0.8–1.2)
Prothrombin Time: 30.3 s — ABNORMAL HIGH (ref 9.1–12.0)

## 2019-01-16 NOTE — Telephone Encounter (Signed)
This encounter was created in error - please disregard.

## 2019-01-16 NOTE — Patient Instructions (Addendum)
Medication Instructions:  Your physician recommends that you continue on your current medications as directed. Please refer to the Current Medication list given to you today.  If you need a refill on your cardiac medications before your next appointment, please call your pharmacy.   Lab work: TODAY: BMET, CBC, INR (we will perform an EKG when you come in)  If you have labs (blood work) drawn today and your tests are completely normal, you will receive your results only by: Marland Kitchen MyChart Message (if you have MyChart) OR . A paper copy in the mail If you have any lab test that is abnormal or we need to change your treatment, we will call you to review the results.  Testing/Procedures: Your physician has requested that you have a cardiac catheterization. Cardiac catheterization is used to diagnose and/or treat various heart conditions. Doctors may recommend this procedure for a number of different reasons. The most common reason is to evaluate chest pain. Chest pain can be a symptom of coronary artery disease (CAD), and cardiac catheterization can show whether plaque is narrowing or blocking your heart's arteries. This procedure is also used to evaluate the valves, as well as measure the blood flow and oxygen levels in different parts of your heart. For further information please visit [email or web address]. Please follow instruction sheet, as given.  Follow-Up: . You have been referred to Dr. Cyndia Bent, Cardiothoracic Surgeon. An appointment will be arranged after your heart catheterization  Any Other Special Instructions Will Be Listed Below (If Applicable).     [facility] [address] [place] [address] Dept: [phone] Loc: [phone]  Alvester Eads  01/16/2019  You are scheduled for a Cardiac Catheterization on Tuesday, July 21 with Dr. Larae Grooms.  1. Please arrive at the [facility] (Main Entrance A) at [facility]: [address] [place], [place] [address] at 7:00 AM (This time is two hours before your procedure to ensure your preparation). Free valet parking service is available.   Special note: Every effort is made to have your procedure done on time. Please understand that emergencies sometimes delay scheduled procedures.  2. Diet: Do not eat solid foods after midnight.  The patient may have clear liquids until 5am upon the day of the procedure.  3. Labs: TODAY  Your Pre-procedure COVID-19 Testing will be done on 01/19/19 at 12:20 PM at [facility] [place] at [address] [place],  [address] After your swab you will be given a mask to wear and instructed to go home and quarantine/no visitors until after your procedure. If you test positive you will be notified and your procedure will be cancelled.    4. Medication instructions in preparation for your procedure:   Contrast Allergy: No   DO NOT take furosemide (lasix) the morning of your procedure  DO NOT take your coumadin for 3 days prior to your procedure  On the morning of your procedure, take your Aspirin and any morning medicines NOT listed above.  You may use sips of water.  5. Plan for one night stay--bring personal belongings. 6. Bring a current list of your medications and current insurance cards. 7. You MUST have a responsible person to drive you home. 8. Someone MUST be with you the first 24 hours after you arrive home or your discharge will be delayed. 9. Please wear clothes that are easy to get on and  off and wear slip-on shoes.  Thank you for allowing [place] to care for you!   --  Invasive Cardiovascular services

## 2019-01-16 NOTE — Telephone Encounter (Signed)
Reaching out to patient to offer assistance regarding upcoming cardiac imaging study; pt verbalizes understanding of appt date/time, parking situation and where to check in, pre-test NPO status and medications ordered, and verified current allergies; name and call back number provided for further questions should they arise Nayanna Seaborn RN Navigator Cardiac Imaging [facility] [phone] office [phone] cell  Pt denies covid symptoms, verbalized understanding of visitor policy. 

## 2019-01-16 NOTE — H&P (View-Only) (Signed)
Virtual Visit via Video Note   This visit type was conducted due to national recommendations for restrictions regarding the COVID-19 Pandemic (e.g. social distancing) in an effort to limit this patient's exposure and mitigate transmission in our community.  Due to his co-morbid illnesses, this patient is at least at moderate risk for complications without adequate follow up.  This format is felt to be most appropriate for this patient at this time.  All issues noted in this document were discussed and addressed.  A limited physical exam was performed with this format.  Please refer to the patient's chart for his consent to telehealth for [facility].   Date:  01/16/2019   ID:  [name], DOB April 26, 1956, MRN [id number]  Patient Location: Home Provider Location: Home  PCP:  Nickola Major, MD  Cardiologist:  Larae Grooms, MD  Electrophysiologist:  None   Evaluation Performed:  Follow-Up Visit  Chief Complaint:  AVR  History of Present Illness:    Jeffery Whitehead is a 63 y.o. male who had an AVR in 8 at age 47, with a St Jude Valve; model number 25A-101; serial number [id number]; blood type A+. He has been on Coumadin since that time. He moved here from [place] in 2015 for his job at [employer]. He had a bicuspid aortic valve that was replaced. Only bleeding issue has been occasional bloody nose. He has had several cauterizations.   In 2015, he noticed some SHOB after he had a cold that has lasted a few weeks. He does have chronic dyspnea on exertion which has started since he gained weight. Echo at that time showed normal LV function.   He had some vein removal in the left leg with Dr. Kellie Simmering in 2015. THis went well. No leg swelling. No bleeding issues. THere are some veins in the right that are being watched.  He admitted and found to have MSSA bacteremia and endocarditis. From the discharge summary in 2/18: Sepsis (due to MSSA infection). -Presented with  temperature of 101.3, respiratory rate of 33 and presence of bacteremia. -Patient given IV fluids, blood pressure was soft on admission. -Sepsis physiology resolved, continue current antibiotics. Patient on ancef and gentamicin for MSSA infection. Appears to be secondary to Lumbar discitis/endocarditis   MSSA bacteremia with endocarditis  -presumed to be secondary to Lumbar discitis  -TEE with small vegetation seen -discussed with ID; planning 6 weeks of IV Ancef and 2 weeks of IV gentamicin  -PICC line in place now. -will be follow on 09/27/16 at ID offices; keep line in place until evaluated by ID physician   Acute encephalopathy -Likely acute metabolic encephalopathy secondary to sepsis. -This is resolved patient is back to his baseline.  He had an echo in May 2018 showing normal LV function with moderate AI and moderate to severe AS.  He was diagnosed with rheumatoid arthritis in 2020.    Repeat echo in 12/2018 showed severe AS with concern for fixed leaflet of aortic valve.  He was asymptomatic.  He needed inguinal hernia surgery but this is being delayed.  Now being evaluated for prosthetic valve dysfunction. Planning to have CT.  Will need cath as well.   The patient does not have symptoms concerning for COVID-19 infection (fever, chills, cough, or new shortness of breath).    Past Medical History:  Diagnosis Date  . Acute encephalopathy    Likely acute metabolic encephalopathy secondary to sepsis  . Anemia   . Asthma   . Bloody  nose    occ  . Cold   . Decreased anal sphincter tone   . Fatty liver 2018  . GERD (gastroesophageal reflux disease)   . Heart murmur   . Heart valve replaced    1983   st jude  . History of aortic insufficiency   . History of aortic stenosis   . History of endocarditis    prosthetic aortic valve endocarditis   . History of shingles 2019  . Hypertension   . Insomnia   . Lumbar discitis   . Mixed hyperlipidemia   . MSSA bacteremia 2018    Sepsis  . Nasal septal deviation   . Obesity   . Pre-diabetes   . Seborrheic keratosis   . Shortness of breath dyspnea    Chronic  . Sleep apnea    cpap  . Umbilical hernia   . Varicose veins    Past Surgical History:  Procedure Laterality Date  . AORTIC VALVE REPLACEMENT  1983   St. Jude  . FLUOROSCOPY GUIDANCE N/A 08/24/2016   Procedure: Fluoroscopy Guidance;  Surgeon: Leonie Man, MD;  Location: [facility];  Service: Cardiovascular;  Laterality: N/A;  . INSERTION OF MESH N/A 06/13/2018   Procedure: INSERTION OF MESH;  Surgeon: Michael Boston, MD;  Location: [facility];  Service: General;  Laterality: N/A;  . Nasal cautery    . TEE WITHOUT CARDIOVERSION N/A 08/24/2016   Procedure: TRANSESOPHAGEAL ECHOCARDIOGRAM (TEE);  Surgeon: Pixie Casino, MD;  Location: [facility] ENDOSCOPY;  Service: Cardiovascular;  Laterality: N/A;  . TONSILLECTOMY    . VEIN LIGATION AND STRIPPING Left 08/15/2014   Procedure: SAPHENOUS VEIN LIGATION AND EXCISION OF VARICOSITIES;  Surgeon: Mal Misty, MD;  Location: [facility];  Service: Vascular;  Laterality: Left;  Marland Kitchen VENTRAL HERNIA REPAIR N/A 06/13/2018   Procedure: LAPAROSCOPIC VENTRAL WALL HERNIA REPAIR ERAS PATHWAY;  Surgeon: Michael Boston, MD;  Location: [facility];  Service: General;  Laterality: N/A;     No outpatient medications have been marked as taking for the 01/16/19 encounter (Appointment) with [name], MD.     Allergies:   Patient has no known allergies.   Social History   Tobacco Use  . Smoking status: Never Smoker  . Smokeless tobacco: Never Used  Substance Use Topics  . Alcohol use: Yes    Alcohol/week: 0.0 standard drinks    Comment: wine at night  . Drug use: No     Family Hx: The patient's family history includes Heart disease in his father.  ROS:   Please see the history of present illness.    Inguinal hernia  All other systems reviewed and are negative.   Prior CV studies:   The following studies were  reviewed today:  Echo reviewed  Labs/Other Tests and Data Reviewed:    EKG:  No ECG reviewed.  Recent Labs: 06/05/2018: BUN 28; Creatinine, Ser 0.88; Hemoglobin 13.3; Platelets 229; Potassium 4.9; Sodium 140   Recent Lipid Panel Lab Results  Component Value Date/Time   CHOL 110 06/23/2017 09:04 AM   TRIG 105 06/23/2017 09:04 AM   HDL 39 (L) 06/23/2017 09:04 AM   CHOLHDL 2.8 06/23/2017 09:04 AM   CHOLHDL 3.7 03/08/2016 08:37 AM   LDLCALC 50 06/23/2017 09:04 AM    Wt Readings from Last 3 Encounters:  01/01/19 192 lb (87.1 kg)  07/19/18 231 lb 12.8 oz (105.1 kg)  06/05/18 245 lb (111.1 kg)     Objective:    Vital Signs:  There were no vitals taken for this visit.   VITAL SIGNS:  reviewed RESPIRATORY:  normal respiratory effort, symmetric expansion CARDIOVASCULAR:  no obvious volume overload PSYCH:  normal affect exam limited  ASSESSMENT & PLAN:    1. S/p AVR: severe AS of prosthetic valve.  CT of valve today.  Plan for cath next week.  2. Anticoagulated: No bleeding issues.  Will hold Coumadin a few days before diagnostic cath.  3. Hyperlipidemia: Controlled in 10/19.  Cardiac catheterization was discussed with the patient fully. The patient understands that risks include but are not limited to stroke (1 in 1000), death (1 in 25), kidney failure [usually temporary] (1 in 500), bleeding (1 in 200), allergic reaction [possibly serious] (1 in 200).  The patient understands and is willing to proceed.     COVID-19 Education: The signs and symptoms of COVID-19 were discussed with the patient and how to seek care for testing (follow up with PCP or arrange E-visit).  The importance of social distancing was discussed today.  Time:   Today, I have spent 25 minutes with the patient with telehealth technology discussing the above problems.     Medication Adjustments/Labs and Tests Ordered: Current medicines are reviewed at length with the patient today.  Concerns regarding  medicines are outlined above.   Tests Ordered: No orders of the defined types were placed in this encounter.   Medication Changes: No orders of the defined types were placed in this encounter.   Follow Up:  Virtual Visit or In Person in 3 month(s)  Signed, Larae Grooms, MD  01/16/2019 12:04 PM    [place]

## 2019-01-16 NOTE — Progress Notes (Signed)
Virtual Visit via Video Note   This visit type was conducted due to national recommendations for restrictions regarding the COVID-19 Pandemic (e.g. social distancing) in an effort to limit this patient's exposure and mitigate transmission in our community.  Due to his co-morbid illnesses, this patient is at least at moderate risk for complications without adequate follow up.  This format is felt to be most appropriate for this patient at this time.  All issues noted in this document were discussed and addressed.  A limited physical exam was performed with this format.  Please refer to the patient's chart for his consent to telehealth for [facility].   Date:  01/16/2019   ID:  Jeffery Whitehead, DOB 12/30/1955, MRN [id number]  Patient Location: Home Provider Location: Home  PCP:  Nickola Major, MD  Cardiologist:  Larae Grooms, MD  Electrophysiologist:  None   Evaluation Performed:  Follow-Up Visit  Chief Complaint:  AVR  History of Present Illness:    Jeffery Whitehead is a 63 y.o. male who had an AVR in 53 at age 16, with a St Jude Valve; model number 25A-101; serial number [id number]; blood type A+. He has been on Coumadin since that time. He moved here from [place] in 2015 for his job at [employer]. He had a bicuspid aortic valve that was replaced. Only bleeding issue has been occasional bloody nose. He has had several cauterizations.   In 2015, he noticed some SHOB after he had a cold that has lasted a few weeks. He does have chronic dyspnea on exertion which has started since he gained weight. Echo at that time showed normal LV function.   He had some vein removal in the left leg with Dr. Kellie Simmering in 2015. THis went well. No leg swelling. No bleeding issues. THere are some veins in the right that are being watched.  He admitted and found to have MSSA bacteremia and endocarditis. From the discharge summary in 2/18: Sepsis (due to MSSA infection). -Presented with  temperature of 101.3, respiratory rate of 33 and presence of bacteremia. -Patient given IV fluids, blood pressure was soft on admission. -Sepsis physiology resolved, continue current antibiotics. Patient on ancef and gentamicin for MSSA infection. Appears to be secondary to Lumbar discitis/endocarditis   MSSA bacteremia with endocarditis  -presumed to be secondary to Lumbar discitis  -TEE with small vegetation seen -discussed with ID; planning 6 weeks of IV Ancef and 2 weeks of IV gentamicin  -PICC line in place now. -will be follow on 09/27/16 at ID offices; keep line in place until evaluated by ID physician   Acute encephalopathy -Likely acute metabolic encephalopathy secondary to sepsis. -This is resolved patient is back to his baseline.  He had an echo in May 2018 showing normal LV function with moderate AI and moderate to severe AS.  He was diagnosed with rheumatoid arthritis in 2020.    Repeat echo in 12/2018 showed severe AS with concern for fixed leaflet of aortic valve.  He was asymptomatic.  He needed inguinal hernia surgery but this is being delayed.  Now being evaluated for prosthetic valve dysfunction. Planning to have CT.  Will need cath as well.   The patient does not have symptoms concerning for COVID-19 infection (fever, chills, cough, or new shortness of breath).    Past Medical History:  Diagnosis Date  . Acute encephalopathy    Likely acute metabolic encephalopathy secondary to sepsis  . Anemia   . Asthma   . Bloody  nose    occ  . Cold   . Decreased anal sphincter tone   . Fatty liver 2018  . GERD (gastroesophageal reflux disease)   . Heart murmur   . Heart valve replaced    1983   st jude  . History of aortic insufficiency   . History of aortic stenosis   . History of endocarditis    prosthetic aortic valve endocarditis   . History of shingles 2019  . Hypertension   . Insomnia   . Lumbar discitis   . Mixed hyperlipidemia   . MSSA bacteremia 2018    Sepsis  . Nasal septal deviation   . Obesity   . Pre-diabetes   . Seborrheic keratosis   . Shortness of breath dyspnea    Chronic  . Sleep apnea    cpap  . Umbilical hernia   . Varicose veins    Past Surgical History:  Procedure Laterality Date  . AORTIC VALVE REPLACEMENT  1983   St. Jude  . FLUOROSCOPY GUIDANCE N/A 08/24/2016   Procedure: Fluoroscopy Guidance;  Surgeon: Leonie Man, MD;  Location: [facility];  Service: Cardiovascular;  Laterality: N/A;  . INSERTION OF MESH N/A 06/13/2018   Procedure: INSERTION OF MESH;  Surgeon: Michael Boston, MD;  [facility];  Service: General;  Laterality: N/A;  . Nasal cautery    . TEE WITHOUT CARDIOVERSION N/A 08/24/2016   Procedure: TRANSESOPHAGEAL ECHOCARDIOGRAM (TEE);  Surgeon: Pixie Casino, MD;  Location: [facility] ENDOSCOPY;  Service: Cardiovascular;  Laterality: N/A;  . TONSILLECTOMY    . VEIN LIGATION AND STRIPPING Left 08/15/2014   Procedure: SAPHENOUS VEIN LIGATION AND EXCISION OF VARICOSITIES;  Surgeon: Mal Misty, MD;  Location: [facility];  Service: Vascular;  Laterality: Left;  Marland Kitchen VENTRAL HERNIA REPAIR N/A 06/13/2018   Procedure: LAPAROSCOPIC VENTRAL WALL HERNIA REPAIR ERAS PATHWAY;  Surgeon: Michael Boston, MD;  [facility];  Service: General;  Laterality: N/A;     No outpatient medications have been marked as taking for the 01/16/19 encounter (Appointment) with Jeffery Booze, MD.     Allergies:   Patient has no known allergies.   Social History   Tobacco Use  . Smoking status: Never Smoker  . Smokeless tobacco: Never Used  Substance Use Topics  . Alcohol use: Yes    Alcohol/week: 0.0 standard drinks    Comment: wine at night  . Drug use: No     Family Hx: The patient's family history includes Heart disease in his father.  ROS:   Please see the history of present illness.    Inguinal hernia  All other systems reviewed and are negative.   Prior CV studies:   The following studies were  reviewed today:  Echo reviewed  Labs/Other Tests and Data Reviewed:    EKG:  No ECG reviewed.  Recent Labs: 06/05/2018: BUN 28; Creatinine, Ser 0.88; Hemoglobin 13.3; Platelets 229; Potassium 4.9; Sodium 140   Recent Lipid Panel Lab Results  Component Value Date/Time   CHOL 110 06/23/2017 09:04 AM   TRIG 105 06/23/2017 09:04 AM   HDL 39 (L) 06/23/2017 09:04 AM   CHOLHDL 2.8 06/23/2017 09:04 AM   CHOLHDL 3.7 03/08/2016 08:37 AM   LDLCALC 50 06/23/2017 09:04 AM    Wt Readings from Last 3 Encounters:  01/01/19 192 lb (87.1 kg)  07/19/18 231 lb 12.8 oz (105.1 kg)  06/05/18 245 lb (111.1 kg)     Objective:    Vital Signs:  There were no vitals taken for this visit.   VITAL SIGNS:  reviewed RESPIRATORY:  normal respiratory effort, symmetric expansion CARDIOVASCULAR:  no obvious volume overload PSYCH:  normal affect exam limited  ASSESSMENT & PLAN:    1. S/p AVR: severe AS of prosthetic valve.  CT of valve today.  Plan for cath next week.  2. Anticoagulated: No bleeding issues.  Will hold Coumadin a few days before diagnostic cath.  3. Hyperlipidemia: Controlled in 10/19.  Cardiac catheterization was discussed with the patient fully. The patient understands that risks include but are not limited to stroke (1 in 1000), death (1 in 28), kidney failure [usually temporary] (1 in 500), bleeding (1 in 200), allergic reaction [possibly serious] (1 in 200).  The patient understands and is willing to proceed.     COVID-19 Education: The signs and symptoms of COVID-19 were discussed with the patient and how to seek care for testing (follow up with PCP or arrange E-visit).  The importance of social distancing was discussed today.  Time:   Today, I have spent 25 minutes with the patient with telehealth technology discussing the above problems.     Medication Adjustments/Labs and Tests Ordered: Current medicines are reviewed at length with the patient today.  Concerns regarding  medicines are outlined above.   Tests Ordered: No orders of the defined types were placed in this encounter.   Medication Changes: No orders of the defined types were placed in this encounter.   Follow Up:  Virtual Visit or In Person in 3 month(s)  Signed, Larae Grooms, MD  01/16/2019 12:04 PM    [place]

## 2019-01-16 NOTE — Telephone Encounter (Signed)
Called and spoke to patient since his Cardiac CT is scheduled for tomorrow. Arranged for video visit with Dr. Irish Lack this afternoon at 1:00 PM for H&P and discuss risks and benefits for cath. Will plan for cath on Tuesday 7/21 and will arrange for labs and EKG today. Will arrange covid test. Will place referral to [facility] at visit today.     Virtual Visit Pre-Appointment Phone Call  TELEPHONE CALL NOTE  Sylvan Sookdeo has been deemed a candidate for a follow-up tele-health visit to limit community exposure during the Covid-19 pandemic. I spoke with the patient via phone to ensure availability of phone/video source, confirm preferred email & phone number, and discuss instructions and expectations.  I reminded Jeffery Whitehead to be prepared with any vital sign and/or heart rhythm information that could potentially be obtained via home monitoring, at the time of his visit. I reminded Micaiah Litle to expect a phone call prior to his visit.  Patient agrees to consent below.  Cleon Gustin, RN 01/16/2019 11:33 AM   FULL LENGTH CONSENT FOR TELE-HEALTH VISIT   I hereby voluntarily request, consent and authorize [facility] and its employed or contracted physicians, physician assistants, nurse practitioners or other licensed health care professionals (the Practitioner), to provide me with telemedicine health care services (the "Services") as deemed necessary by the treating Practitioner. I acknowledge and consent to receive the Services by the Practitioner via telemedicine. I understand that the telemedicine visit will involve communicating with the Practitioner through live audiovisual communication technology and the disclosure of certain medical information by electronic transmission. I acknowledge that I have been given the opportunity to request an in-person assessment or other available alternative prior to the telemedicine visit and am voluntarily participating in the telemedicine visit.  I  understand that I have the right to withhold or withdraw my consent to the use of telemedicine in the course of my care at any time, without affecting my right to future care or treatment, and that the Practitioner or I may terminate the telemedicine visit at any time. I understand that I have the right to inspect all information obtained and/or recorded in the course of the telemedicine visit and may receive copies of available information for a reasonable fee.  I understand that some of the potential risks of receiving the Services via telemedicine include:  Marland Kitchen Delay or interruption in medical evaluation due to technological equipment failure or disruption; . Information transmitted may not be sufficient (e.g. poor resolution of images) to allow for appropriate medical decision making by the Practitioner; and/or  . In rare instances, security protocols could fail, causing a breach of personal health information.  Furthermore, I acknowledge that it is my responsibility to provide information about my medical history, conditions and care that is complete and accurate to the best of my ability. I acknowledge that Practitioner's advice, recommendations, and/or decision may be based on factors not within their control, such as incomplete or inaccurate data provided by me or distortions of diagnostic images or specimens that may result from electronic transmissions. I understand that the practice of medicine is not an exact science and that Practitioner makes no warranties or guarantees regarding treatment outcomes. I acknowledge that I will receive a copy of this consent concurrently upon execution via email to the email address I last provided but may also request a printed copy by calling the office of [facility].    I understand that my insurance will be billed for this visit.  I have read or had this consent read to me. . I understand the contents of this consent, which adequately explains the  benefits and risks of the Services being provided via telemedicine.  . I have been provided ample opportunity to ask questions regarding this consent and the Services and have had my questions answered to my satisfaction. . I give my informed consent for the services to be provided through the use of telemedicine in my medical care  By participating in this telemedicine visit I agree to the above.

## 2019-01-17 ENCOUNTER — Ambulatory Visit (HOSPITAL_COMMUNITY)
Admission: RE | Admit: 2019-01-17 | Discharge: 2019-01-17 | Disposition: A | Discharge status: Home / Self Care | Payer: Medicare Other | Source: Ambulatory Visit | Attending: Interventional Cardiology | Admitting: Interventional Cardiology

## 2019-01-17 ENCOUNTER — Other Ambulatory Visit (HOSPITAL_COMMUNITY): Payer: Self-pay | Admitting: Cardiology

## 2019-01-17 ENCOUNTER — Ambulatory Visit (HOSPITAL_COMMUNITY): Admission: RE | Admit: 2019-01-17 | Payer: Medicare Other | Source: Ambulatory Visit

## 2019-01-17 DIAGNOSIS — I350 Nonrheumatic aortic (valve) stenosis: Secondary | ICD-10-CM | POA: Diagnosis not present

## 2019-01-17 DIAGNOSIS — I714 Abdominal aortic aneurysm, without rupture: Secondary | ICD-10-CM | POA: Diagnosis not present

## 2019-01-17 MED ORDER — IOHEXOL 350 MG/ML SOLN
100.0000 mL | Freq: Once | INTRAVENOUS | Status: AC | PRN
  Administered 2019-01-17: 10:00:00 100 mL via INTRAVENOUS

## 2019-01-19 ENCOUNTER — Other Ambulatory Visit (HOSPITAL_COMMUNITY)
Admission: RE | Admit: 2019-01-19 | Discharge: 2019-01-19 | Disposition: A | Discharge status: Home / Self Care | Payer: Medicare Other | Source: Ambulatory Visit | Attending: Interventional Cardiology | Admitting: Interventional Cardiology

## 2019-01-19 DIAGNOSIS — Z1159 Encounter for screening for other viral diseases: Secondary | ICD-10-CM | POA: Diagnosis not present

## 2019-01-19 LAB — SARS CORONAVIRUS 2 (TAT 6-24 HRS): SARS Coronavirus 2: NEGATIVE

## 2019-01-21 ENCOUNTER — Telehealth: Payer: Self-pay | Admitting: *Deleted

## 2019-01-21 NOTE — Telephone Encounter (Signed)
Pt contacted pre-catheterization scheduled at [facility] for: Tuesday January 22, 2019 9 AM /TEE 8 AM Verified arrival time and place: [place] Entrance A at: 6:30 AM  Covid-19 test date: 01/19/19  Nothing to eat or drink after midnight prior to procedures. Contrast allergy: no  Hold: Coumadin-01/19/19 until post procedure. Furosemide/KCl-AM of procedure.   Except hold medications AM meds can be  taken pre-cath with sip of water including: ASA 81 mg  Per Dr Manson Allan will stay overnight after cath.  Due to Covid-19 pandemic, only one support person will be allowed with patient. Must be the same support person for that patient's entire stay. On arrival the support person will be required to wear a mask and will be screened, including having temporal temperature checked (below 100.4 to be cleared). They will be required to wait in the [facility] waiting room for the duration of the procedure. To limit exposure, MDs will continue to call support person after the procedure instead of speaking with them in consult room.   Patients are required to wear a mask when they enter the hospital.      COVID-19 Pre-Screening Questions:  . In the past 7 to 10 days have you had a cough,  shortness of breath, headache, congestion, fever (100 or greater) body aches, chills, sore throat, or sudden loss of taste or sense of smell? Body aches . Have you been around anyone with known Covid 19? no . Have you been around anyone who is awaiting Covid 19 test results in the past 7 to 10 days? no . Have you been around anyone who has been exposed to Covid 19, or has mentioned symptoms of Covid 19 within the past 7 to 10 days? no  I reviewed procedure instructions/mask/visitor, Covid-19 screening questions with patient, he verbalized understanding, thanked me for call.

## 2019-01-21 NOTE — Telephone Encounter (Signed)
Spoke with the patient on the phone regarding MyChart message below.

## 2019-01-22 ENCOUNTER — Ambulatory Visit (HOSPITAL_COMMUNITY)
Admission: RE | Admit: 2019-01-22 | Discharge: 2019-01-23 | Disposition: A | Discharge status: Home / Self Care | Payer: Medicare Other | Attending: Interventional Cardiology | Admitting: Interventional Cardiology

## 2019-01-22 ENCOUNTER — Ambulatory Visit (HOSPITAL_BASED_OUTPATIENT_CLINIC_OR_DEPARTMENT_OTHER): Payer: Medicare Other

## 2019-01-22 ENCOUNTER — Ambulatory Visit (HOSPITAL_COMMUNITY)
Admission: RE | Disposition: A | Discharge status: Home / Self Care | Payer: Medicare Other | Source: Home / Self Care | Attending: Interventional Cardiology

## 2019-01-22 ENCOUNTER — Encounter (HOSPITAL_COMMUNITY)
Admission: RE | Disposition: A | Discharge status: Home / Self Care | Payer: Medicare Other | Source: Home / Self Care | Attending: Interventional Cardiology

## 2019-01-22 ENCOUNTER — Encounter (HOSPITAL_COMMUNITY): Payer: Self-pay | Admitting: *Deleted

## 2019-01-22 ENCOUNTER — Other Ambulatory Visit: Payer: Self-pay

## 2019-01-22 DIAGNOSIS — K760 Fatty (change of) liver, not elsewhere classified: Secondary | ICD-10-CM | POA: Insufficient documentation

## 2019-01-22 DIAGNOSIS — J45909 Unspecified asthma, uncomplicated: Secondary | ICD-10-CM | POA: Diagnosis not present

## 2019-01-22 DIAGNOSIS — G4700 Insomnia, unspecified: Secondary | ICD-10-CM | POA: Insufficient documentation

## 2019-01-22 DIAGNOSIS — I350 Nonrheumatic aortic (valve) stenosis: Secondary | ICD-10-CM

## 2019-01-22 DIAGNOSIS — I420 Dilated cardiomyopathy: Secondary | ICD-10-CM | POA: Diagnosis not present

## 2019-01-22 DIAGNOSIS — Z952 Presence of prosthetic heart valve: Secondary | ICD-10-CM | POA: Diagnosis not present

## 2019-01-22 DIAGNOSIS — M069 Rheumatoid arthritis, unspecified: Secondary | ICD-10-CM | POA: Insufficient documentation

## 2019-01-22 DIAGNOSIS — G4730 Sleep apnea, unspecified: Secondary | ICD-10-CM | POA: Diagnosis not present

## 2019-01-22 DIAGNOSIS — E782 Mixed hyperlipidemia: Secondary | ICD-10-CM | POA: Diagnosis not present

## 2019-01-22 DIAGNOSIS — I2510 Atherosclerotic heart disease of native coronary artery without angina pectoris: Secondary | ICD-10-CM | POA: Insufficient documentation

## 2019-01-22 DIAGNOSIS — Z8619 Personal history of other infectious and parasitic diseases: Secondary | ICD-10-CM | POA: Insufficient documentation

## 2019-01-22 DIAGNOSIS — I712 Thoracic aortic aneurysm, without rupture: Secondary | ICD-10-CM | POA: Insufficient documentation

## 2019-01-22 DIAGNOSIS — Z8249 Family history of ischemic heart disease and other diseases of the circulatory system: Secondary | ICD-10-CM | POA: Diagnosis not present

## 2019-01-22 DIAGNOSIS — I10 Essential (primary) hypertension: Secondary | ICD-10-CM | POA: Diagnosis not present

## 2019-01-22 DIAGNOSIS — I352 Nonrheumatic aortic (valve) stenosis with insufficiency: Secondary | ICD-10-CM | POA: Diagnosis not present

## 2019-01-22 DIAGNOSIS — E669 Obesity, unspecified: Secondary | ICD-10-CM | POA: Insufficient documentation

## 2019-01-22 DIAGNOSIS — Z7901 Long term (current) use of anticoagulants: Secondary | ICD-10-CM | POA: Diagnosis not present

## 2019-01-22 DIAGNOSIS — T82857A Stenosis of cardiac prosthetic devices, implants and grafts, initial encounter: Secondary | ICD-10-CM

## 2019-01-22 DIAGNOSIS — R7303 Prediabetes: Secondary | ICD-10-CM | POA: Insufficient documentation

## 2019-01-22 DIAGNOSIS — K219 Gastro-esophageal reflux disease without esophagitis: Secondary | ICD-10-CM | POA: Diagnosis not present

## 2019-01-22 DIAGNOSIS — I340 Nonrheumatic mitral (valve) insufficiency: Secondary | ICD-10-CM

## 2019-01-22 HISTORY — PX: BUBBLE STUDY: SHX6837

## 2019-01-22 HISTORY — PX: TEE WITHOUT CARDIOVERSION: SHX5443

## 2019-01-22 HISTORY — PX: RIGHT HEART CATH AND CORONARY ANGIOGRAPHY: CATH118264

## 2019-01-22 LAB — POCT I-STAT EG7
Acid-base deficit: 1 mmol/L (ref 0.0–2.0)
Bicarbonate: 25.3 mmol/L (ref 20.0–28.0)
Calcium, Ion: 1.3 mmol/L (ref 1.15–1.40)
HCT: 36 % — ABNORMAL LOW (ref 39.0–52.0)
Hemoglobin: 12.2 g/dL — ABNORMAL LOW (ref 13.0–17.0)
O2 Saturation: 77 %
Potassium: 4.4 mmol/L (ref 3.5–5.1)
Sodium: 142 mmol/L (ref 135–145)
TCO2: 27 mmol/L (ref 22–32)
pCO2, Ven: 47.2 mmHg (ref 44.0–60.0)
pH, Ven: 7.337 (ref 7.250–7.430)
pO2, Ven: 44 mmHg (ref 32.0–45.0)

## 2019-01-22 LAB — PROTIME-INR
INR: 1.3 — ABNORMAL HIGH (ref 0.8–1.2)
Prothrombin Time: 15.6 seconds — ABNORMAL HIGH (ref 11.4–15.2)

## 2019-01-22 LAB — POCT I-STAT 7, (LYTES, BLD GAS, ICA,H+H)
Acid-base deficit: 1 mmol/L (ref 0.0–2.0)
Bicarbonate: 24 mmol/L (ref 20.0–28.0)
Calcium, Ion: 1.3 mmol/L (ref 1.15–1.40)
HCT: 36 % — ABNORMAL LOW (ref 39.0–52.0)
Hemoglobin: 12.2 g/dL — ABNORMAL LOW (ref 13.0–17.0)
O2 Saturation: 99 %
Potassium: 4.4 mmol/L (ref 3.5–5.1)
Sodium: 141 mmol/L (ref 135–145)
TCO2: 25 mmol/L (ref 22–32)
pCO2 arterial: 41.5 mmHg (ref 32.0–48.0)
pH, Arterial: 7.371 (ref 7.350–7.450)
pO2, Arterial: 130 mmHg — ABNORMAL HIGH (ref 83.0–108.0)

## 2019-01-22 LAB — BASIC METABOLIC PANEL
Anion gap: 9 (ref 5–15)
BUN: 25 mg/dL — ABNORMAL HIGH (ref 8–23)
CO2: 23 mmol/L (ref 22–32)
Calcium: 9.3 mg/dL (ref 8.9–10.3)
Chloride: 108 mmol/L (ref 98–111)
Creatinine, Ser: 0.84 mg/dL (ref 0.61–1.24)
GFR calc Af Amer: >60 mL/min (ref >60)
GFR calc non Af Amer: >60 mL/min (ref >60)
Glucose, Bld: 98 mg/dL (ref 70–99)
Potassium: 3.8 mmol/L (ref 3.5–5.1)
Sodium: 140 mmol/L (ref 135–145)

## 2019-01-22 SURGERY — ECHOCARDIOGRAM, TRANSESOPHAGEAL
Anesthesia: Moderate Sedation

## 2019-01-22 SURGERY — RIGHT HEART CATH AND CORONARY ANGIOGRAPHY
Anesthesia: LOCAL

## 2019-01-22 MED ORDER — MIDAZOLAM HCL 2 MG/2ML IJ SOLN
INTRAMUSCULAR | Status: DC | PRN
  Administered 2019-01-22: 1 mg via INTRAVENOUS

## 2019-01-22 MED ORDER — NITROGLYCERIN 1 MG/10 ML FOR IR/CATH LAB
INTRA_ARTERIAL | Status: AC
  Filled 2019-01-22: qty 10

## 2019-01-22 MED ORDER — SODIUM CHLORIDE 0.9% FLUSH: 3.0000 mL | Freq: Two times a day (BID) | INTRAVENOUS | Status: DC

## 2019-01-22 MED ORDER — HEPARIN SODIUM (PORCINE) 1000 UNIT/ML IJ SOLN
INTRAMUSCULAR | Status: DC | PRN
  Administered 2019-01-22: 4500 [IU] via INTRAVENOUS

## 2019-01-22 MED ORDER — FENTANYL CITRATE (PF) 100 MCG/2ML IJ SOLN
INTRAMUSCULAR | Status: DC | PRN
  Administered 2019-01-22 (×3): 25 ug via INTRAVENOUS

## 2019-01-22 MED ORDER — LIDOCAINE HCL (PF) 1 % IJ SOLN
INTRAMUSCULAR | Status: AC
  Filled 2019-01-22: qty 30

## 2019-01-22 MED ORDER — SODIUM CHLORIDE 0.9 % IV SOLN: 250.0000 mL | INTRAVENOUS | Status: DC | PRN

## 2019-01-22 MED ORDER — PREDNISONE 5 MG (48) PO TBPK: 5.0000 mg | ORAL_TABLET | ORAL | Status: DC

## 2019-01-22 MED ORDER — LIDOCAINE HCL (PF) 1 % IJ SOLN
INTRAMUSCULAR | Status: DC | PRN
  Administered 2019-01-22: 1 mL
  Administered 2019-01-22: 6 mL

## 2019-01-22 MED ORDER — HYDRALAZINE HCL 20 MG/ML IJ SOLN: 10.0000 mg | INTRAMUSCULAR | Status: AC | PRN

## 2019-01-22 MED ORDER — FENTANYL CITRATE (PF) 100 MCG/2ML IJ SOLN
INTRAMUSCULAR | Status: DC | PRN
  Administered 2019-01-22: 25 ug via INTRAVENOUS

## 2019-01-22 MED ORDER — METHOTREXATE 2.5 MG PO TABS: 15.0000 mg | ORAL_TABLET | ORAL | Status: DC

## 2019-01-22 MED ORDER — LORATADINE 10 MG PO TABS
10.0000 mg | ORAL_TABLET | Freq: Every day | ORAL | Status: DC
  Administered 2019-01-23: 10 mg via ORAL
  Filled 2019-01-22: qty 1

## 2019-01-22 MED ORDER — LINACLOTIDE 72 MCG PO CAPS
72.0000 ug | ORAL_CAPSULE | Freq: Every day | ORAL | Status: DC
  Filled 2019-01-22 (×2): qty 1

## 2019-01-22 MED ORDER — SODIUM CHLORIDE 0.9% FLUSH: 3.0000 mL | INTRAVENOUS | Status: DC | PRN

## 2019-01-22 MED ORDER — FERROUS SULFATE 324 (65 FE) MG PO TBEC: 324.0000 mg | DELAYED_RELEASE_TABLET | Freq: Every day | ORAL | Status: DC

## 2019-01-22 MED ORDER — HEPARIN (PORCINE) IN NACL 1000-0.9 UT/500ML-% IV SOLN
INTRAVENOUS | Status: AC
  Filled 2019-01-22: qty 1000

## 2019-01-22 MED ORDER — VERAPAMIL HCL 2.5 MG/ML IV SOLN
INTRAVENOUS | Status: AC
  Filled 2019-01-22: qty 2

## 2019-01-22 MED ORDER — HEPARIN SODIUM (PORCINE) 1000 UNIT/ML IJ SOLN
INTRAMUSCULAR | Status: AC
  Filled 2019-01-22: qty 1

## 2019-01-22 MED ORDER — FENTANYL CITRATE (PF) 100 MCG/2ML IJ SOLN
INTRAMUSCULAR | Status: AC
  Filled 2019-01-22: qty 4

## 2019-01-22 MED ORDER — VERAPAMIL HCL 2.5 MG/ML IV SOLN
INTRAVENOUS | Status: DC | PRN
  Administered 2019-01-22: 10 mL via INTRA_ARTERIAL

## 2019-01-22 MED ORDER — MIDAZOLAM HCL (PF) 10 MG/2ML IJ SOLN
INTRAMUSCULAR | Status: DC | PRN
  Administered 2019-01-22 (×4): 2 mg via INTRAVENOUS

## 2019-01-22 MED ORDER — PREDNISONE 10 MG PO TABS
10.0000 mg | ORAL_TABLET | Freq: Every day | ORAL | Status: DC
  Administered 2019-01-23: 10 mg via ORAL
  Filled 2019-01-22: qty 1

## 2019-01-22 MED ORDER — SODIUM CHLORIDE 0.9 % WEIGHT BASED INFUSION
3.0000 mL/kg/h | INTRAVENOUS | Status: DC
  Administered 2019-01-22: 3 mL/kg/h via INTRAVENOUS

## 2019-01-22 MED ORDER — MIDAZOLAM HCL (PF) 5 MG/ML IJ SOLN
INTRAMUSCULAR | Status: AC
  Filled 2019-01-22: qty 3

## 2019-01-22 MED ORDER — SODIUM CHLORIDE 0.9 % IV SOLN: INTRAVENOUS | Status: AC

## 2019-01-22 MED ORDER — HEPARIN (PORCINE) IN NACL 1000-0.9 UT/500ML-% IV SOLN
INTRAVENOUS | Status: DC | PRN
  Administered 2019-01-22 (×2): 500 mL

## 2019-01-22 MED ORDER — PREDNISONE 5 MG PO TABS: 5.0000 mg | ORAL_TABLET | Freq: Every day | ORAL | Status: DC

## 2019-01-22 MED ORDER — ACETAMINOPHEN 325 MG PO TABS: 650.0000 mg | ORAL_TABLET | ORAL | Status: DC | PRN

## 2019-01-22 MED ORDER — IOHEXOL 350 MG/ML SOLN
INTRAVENOUS | Status: DC | PRN
  Administered 2019-01-22: 50 mL via INTRA_ARTERIAL

## 2019-01-22 MED ORDER — ONDANSETRON HCL 4 MG/2ML IJ SOLN: 4.0000 mg | Freq: Four times a day (QID) | INTRAMUSCULAR | Status: DC | PRN

## 2019-01-22 MED ORDER — ALBUTEROL SULFATE (2.5 MG/3ML) 0.083% IN NEBU: 3.0000 mL | INHALATION_SOLUTION | Freq: Four times a day (QID) | RESPIRATORY_TRACT | Status: DC | PRN

## 2019-01-22 MED ORDER — SODIUM CHLORIDE 0.9 % WEIGHT BASED INFUSION
1.0000 mL/kg/h | INTRAVENOUS | Status: DC
  Administered 2019-01-22: 1 mL/kg/h via INTRAVENOUS

## 2019-01-22 MED ORDER — WARFARIN - PHYSICIAN DOSING INPATIENT
Freq: Every day | Status: DC
  Administered 2019-01-22: 18:00:00

## 2019-01-22 MED ORDER — DIPHENHYDRAMINE HCL 50 MG/ML IJ SOLN
INTRAMUSCULAR | Status: AC
  Filled 2019-01-22: qty 1

## 2019-01-22 MED ORDER — WARFARIN SODIUM 7.5 MG PO TABS
7.5000 mg | ORAL_TABLET | ORAL | Status: DC
  Administered 2019-01-22: 7.5 mg via ORAL
  Filled 2019-01-22: qty 1

## 2019-01-22 MED ORDER — FERROUS SULFATE 325 (65 FE) MG PO TABS
325.0000 mg | ORAL_TABLET | Freq: Every day | ORAL | Status: DC
  Administered 2019-01-23: 325 mg via ORAL
  Filled 2019-01-22: qty 1

## 2019-01-22 MED ORDER — HEPARIN (PORCINE) 25000 UT/250ML-% IV SOLN
1350.0000 [IU]/h | INTRAVENOUS | Status: DC
  Administered 2019-01-22: 1200 [IU]/h via INTRAVENOUS
  Filled 2019-01-22 (×2): qty 250

## 2019-01-22 MED ORDER — FENTANYL CITRATE (PF) 100 MCG/2ML IJ SOLN
INTRAMUSCULAR | Status: AC
  Filled 2019-01-22: qty 2

## 2019-01-22 MED ORDER — SODIUM CHLORIDE BACTERIOSTATIC 0.9 % IJ SOLN
INTRAMUSCULAR | Status: DC | PRN
  Administered 2019-01-22: 9 mL via INTRAVENOUS

## 2019-01-22 MED ORDER — ROSUVASTATIN CALCIUM 5 MG PO TABS
5.0000 mg | ORAL_TABLET | Freq: Every day | ORAL | Status: DC
  Administered 2019-01-22: 21:00:00 5 mg via ORAL
  Filled 2019-01-22: qty 1

## 2019-01-22 MED ORDER — LABETALOL HCL 5 MG/ML IV SOLN: 10.0000 mg | INTRAVENOUS | Status: AC | PRN

## 2019-01-22 MED ORDER — BUTAMBEN-TETRACAINE-BENZOCAINE 2-2-14 % EX AERO
INHALATION_SPRAY | CUTANEOUS | Status: DC | PRN
  Administered 2019-01-22: 2 via TOPICAL

## 2019-01-22 MED ORDER — WARFARIN SODIUM 2.5 MG PO TABS
3.7500 mg | ORAL_TABLET | ORAL | Status: DC
  Filled 2019-01-22: qty 1

## 2019-01-22 MED ORDER — ASPIRIN 81 MG PO CHEW: 81.0000 mg | CHEWABLE_TABLET | ORAL | Status: DC

## 2019-01-22 MED ORDER — PANTOPRAZOLE SODIUM 40 MG PO TBEC
40.0000 mg | DELAYED_RELEASE_TABLET | Freq: Every day | ORAL | Status: DC
  Administered 2019-01-23: 40 mg via ORAL
  Filled 2019-01-22: qty 1

## 2019-01-22 MED ORDER — ZOLPIDEM TARTRATE 5 MG PO TABS
10.0000 mg | ORAL_TABLET | Freq: Every day | ORAL | Status: DC
  Administered 2019-01-22: 21:00:00 10 mg via ORAL
  Filled 2019-01-22: qty 2

## 2019-01-22 MED ORDER — MIDAZOLAM HCL 2 MG/2ML IJ SOLN
INTRAMUSCULAR | Status: AC
  Filled 2019-01-22: qty 2

## 2019-01-22 MED ORDER — HEPARIN (PORCINE) IN NACL 1000-0.9 UT/500ML-% IV SOLN
INTRAVENOUS | Status: DC | PRN
  Administered 2019-01-22: 500 mL

## 2019-01-22 SURGICAL SUPPLY — 14 items
BAND ZEPHYR COMPRESS 30 LONG (HEMOSTASIS) ×1 IMPLANT
CATH 5FR JL3.5 JR4 ANG PIG MP (CATHETERS) ×1 IMPLANT
CATH BALLN WEDGE 5F 110CM (CATHETERS) ×1 IMPLANT
COVER DOME SNAP 22 D (MISCELLANEOUS) ×1 IMPLANT
GUIDEWIRE .025 260CM (WIRE) ×1 IMPLANT
GUIDEWIRE INQWIRE 1.5J.035X260 (WIRE) IMPLANT
INQWIRE 1.5J .035X260CM (WIRE) ×2
KIT HEART LEFT (KITS) ×2 IMPLANT
PACK CARDIAC CATHETERIZATION (CUSTOM PROCEDURE TRAY) ×2 IMPLANT
SHEATH GLIDE SLENDER 4/5FR (SHEATH) ×1 IMPLANT
SHEATH PROBE COVER 6X72 (BAG) ×1 IMPLANT
SHEATH RAIN RADIAL 21G 6FR (SHEATH) ×1 IMPLANT
TRANSDUCER W/STOPCOCK (MISCELLANEOUS) ×2 IMPLANT
TUBING CIL FLEX 10 FLL-RA (TUBING) ×2 IMPLANT

## 2019-01-22 NOTE — Plan of Care (Signed)
  Problem: Activity: Goal: Ability to return to baseline activity level will improve Outcome: Completed/Met   Problem: Cardiovascular: Goal: Vascular access site(s) Level 0-1 will be maintained Outcome: Completed/Met

## 2019-01-22 NOTE — CV Procedure (Signed)
INDICATIONS: prosthetic aortic valve dysfunction  PROCEDURE:   Informed consent was obtained prior to the procedure. The risks, benefits and alternatives for the procedure were discussed and the patient comprehended these risks.  Risks include, but are not limited to, cough, sore throat, vomiting, nausea, somnolence, esophageal and stomach trauma or perforation, bleeding, low blood pressure, aspiration, pneumonia, infection, trauma to the teeth and death.    After a procedural time-out, the oropharynx was anesthetized with 20% benzocaine spray.   During this procedure the patient was administered a total of Versed 6 mg and Fentanyl 50 mg to achieve and maintain moderate conscious sedation.  The patient's heart rate, blood pressure, and oxygen saturationweare monitored continuously during the procedure. The period of conscious sedation was 40 minutes, of which I was present face-to-face 100% of this time.  The transesophageal probe was inserted in the esophagus and stomach without difficulty and multiple views were obtained.  The patient was kept under observation until the patient left the procedure room.  The patient left the procedure room in stable condition.   Agitated microbubble saline contrast was administered.  COMPLICATIONS:    There were no immediate complications.  FINDINGS:  Severe prosthetic AS. Likely severe, very eccentric AR.  Communication with sinus of Valsalva with likely diastolic and systolic flow into sinus of Valsalva aneursym.  RECOMMENDATIONS:    To the cath lab when alert.  Time Spent Directly with the Patient:  60 minutes   Elouise Munroe 01/22/2019, 9:38 AM

## 2019-01-22 NOTE — Interval H&P Note (Signed)
History and Physical Interval Cath Lab Visit (complete for each Cath Lab visit)  Clinical Evaluation Leading to the Procedure:   ACS: No.  Non-ACS:    Anginal Classification: CCS II  Anti-ischemic medical therapy: Minimal Therapy (1 class of medications)  Non-Invasive Test Results: No non-invasive testing performed  Prior CABG: No previous CABG   Cath pre aortic valve replacement   Note:  01/22/2019 9:17 AM  Jeffery Whitehead  has presented today for surgery, with the diagnosis of Aortic stenosis.  The various methods of treatment have been discussed with the patient and family. After consideration of risks, benefits and other options for treatment, the patient has consented to  Procedure(s): RIGHT/LEFT HEART CATH AND CORONARY ANGIOGRAPHY (N/A) as a surgical intervention.  The patient's history has been reviewed, patient examined, no change in status, stable for surgery.  I have reviewed the patient's chart and labs.  Questions were answered to the patient's satisfaction.     Larae Grooms

## 2019-01-22 NOTE — Progress Notes (Signed)
Echocardiogram Echocardiogram Transesophageal has been performed.  Jeffery Whitehead [name] 01/22/2019, 9:41 AM

## 2019-01-22 NOTE — Progress Notes (Signed)
ANTICOAGULATION CONSULT NOTE - Initial Consult  Pharmacy Consult for Heparin Indication: prostheic aortic valve  No Known Allergies  Patient Measurements: Height: 6' (182.9 cm) Weight: 190 lb (86.2 kg) IBW/kg (Calculated) : 77.6 Heparin Dosing Weight: 86.2 kg  Vital Signs: Temp: 98.6 F (37 C) (07/21 0659) Temp Source: Oral (07/21 0659) BP: 108/47 (07/21 1120) Pulse Rate: 50 (07/21 1120)  Labs: Recent Labs    01/22/19 0710  LABPROT 15.6*  INR 1.3*  CREATININE 0.84    Estimated Creatinine Clearance: 98.8 mL/min (by C-G formula based on SCr of 0.84 mg/dL).   Medical History: Past Medical History:  Diagnosis Date  . Acute encephalopathy    Likely acute metabolic encephalopathy secondary to sepsis  . Anemia   . Asthma   . Bloody nose    occ  . Cold   . Decreased anal sphincter tone   . Fatty liver 2018  . GERD (gastroesophageal reflux disease)   . Heart murmur   . Heart valve replaced    1983   st jude  . History of aortic insufficiency   . History of aortic stenosis   . History of endocarditis    prosthetic aortic valve endocarditis   . History of shingles 2019  . Hypertension   . Insomnia   . Lumbar discitis   . Mixed hyperlipidemia   . MSSA bacteremia 2018   Sepsis  . Nasal septal deviation   . Obesity   . Pre-diabetes   . Seborrheic keratosis   . Shortness of breath dyspnea    Chronic  . Sleep apnea    cpap  . Umbilical hernia   . Varicose veins     Assessment: CC/HPI: Aortic stenosis with prosthetic valve dysfunction  PMH: AVR in 1983 at age 63, with a St Jude Valve on Coumadin,  MSSA bacteremia and endocarditis 08/2016 secondary to Lumbar discitis, abx x 6 wks, new RA dx 2020, anemia, asthma, fatty liver, GERD, shingles, HTN, insomnia, HLD, nasal septal deviation, obesity, pre-DM, seborrheic keratosis, OSA, umbilical hernia, vericose veins  Significant events: occasional bloody nose.   Anticoag: Prosthetic aortic valve. Start hep 8 hrs  after sheath out. INR today 1.3. Cath 7/21: Nonobstructive CAD. Radial sheath out 1045. - Coumadin PTA: 7.5mg  TTSS, 3.75mg  MWF (INR 2.8 on 7/15). Held for cath  Goal of Therapy:  Heparin level 0.3-0.7 units/ml Monitor platelets by anticoagulation protocol: Yes   Plan:  At 1845, start IV heparin (no bolus) at 1200 units/hr Check heparin level and CBC daily   Jeffery Whitehead, PharmD, [place] Clinical Staff Pharmacist Jeffery Whitehead 01/22/2019,1:34 PM

## 2019-01-22 NOTE — Interval H&P Note (Signed)
History and Physical Interval Note:  01/22/2019 8:27 AM  [name]  has presented today for surgery, with the diagnosis of evaluate mechanical valve.  The various methods of treatment have been discussed with the patient and family. After consideration of risks, benefits and other options for treatment, the patient has consented to  Procedure(s): TRANSESOPHAGEAL ECHOCARDIOGRAM (TEE) (N/A) as a surgical intervention.  The patient's history has been reviewed, patient examined, no change in status, stable for surgery.  I have reviewed the patient's chart and labs.  Questions were answered to the patient's satisfaction.     Elouise Munroe

## 2019-01-23 ENCOUNTER — Encounter (HOSPITAL_COMMUNITY): Payer: Self-pay | Admitting: Interventional Cardiology

## 2019-01-23 DIAGNOSIS — T82857A Stenosis of cardiac prosthetic devices, implants and grafts, initial encounter: Secondary | ICD-10-CM

## 2019-01-23 DIAGNOSIS — I420 Dilated cardiomyopathy: Secondary | ICD-10-CM | POA: Diagnosis not present

## 2019-01-23 DIAGNOSIS — Z952 Presence of prosthetic heart valve: Secondary | ICD-10-CM | POA: Diagnosis not present

## 2019-01-23 DIAGNOSIS — I352 Nonrheumatic aortic (valve) stenosis with insufficiency: Secondary | ICD-10-CM | POA: Diagnosis not present

## 2019-01-23 DIAGNOSIS — I350 Nonrheumatic aortic (valve) stenosis: Secondary | ICD-10-CM | POA: Diagnosis not present

## 2019-01-23 DIAGNOSIS — I712 Thoracic aortic aneurysm, without rupture: Secondary | ICD-10-CM | POA: Diagnosis not present

## 2019-01-23 DIAGNOSIS — I2510 Atherosclerotic heart disease of native coronary artery without angina pectoris: Secondary | ICD-10-CM | POA: Diagnosis not present

## 2019-01-23 DIAGNOSIS — I10 Essential (primary) hypertension: Secondary | ICD-10-CM | POA: Diagnosis not present

## 2019-01-23 LAB — CBC
HCT: 38.5 % — ABNORMAL LOW (ref 39.0–52.0)
Hemoglobin: 12.7 g/dL — ABNORMAL LOW (ref 13.0–17.0)
MCH: 31.1 pg (ref 26.0–34.0)
MCHC: 33 g/dL (ref 30.0–36.0)
MCV: 94.1 fL (ref 80.0–100.0)
Platelets: 199 10*3/uL (ref 150–400)
RBC: 4.09 MIL/uL — ABNORMAL LOW (ref 4.22–5.81)
RDW: 15 % (ref 11.5–15.5)
WBC: 8.1 10*3/uL (ref 4.0–10.5)
nRBC: 0 % (ref 0.0–0.2)

## 2019-01-23 LAB — PROTIME-INR
INR: 1.3 — ABNORMAL HIGH (ref 0.8–1.2)
Prothrombin Time: 16.1 seconds — ABNORMAL HIGH (ref 11.4–15.2)

## 2019-01-23 LAB — HEPARIN LEVEL (UNFRACTIONATED): Heparin Unfractionated: 0.29 IU/mL — ABNORMAL LOW (ref 0.30–0.70)

## 2019-01-23 MED ORDER — WARFARIN SODIUM 5 MG PO TABS: 10.0000 mg | ORAL_TABLET | Freq: Every day | ORAL | 0 refills | Status: DC

## 2019-01-23 MED FILL — WARFARIN SODIUM 5 MG TABLET: 5 | 4 days supply | Qty: 4 | Fill #0

## 2019-01-23 MED FILL — Nitroglycerin IV Soln 100 MCG/ML in D5W: INTRA_ARTERIAL | Qty: 10 | Status: AC

## 2019-01-23 NOTE — Discharge Summary (Addendum)
Discharge Summary    Patient ID: Jeffery Whitehead MRN: [id number]; DOB: Mar 26, 1956  Admit date: 01/22/2019 Discharge date: 01/23/2019  Primary Care Provider: Nickola Major, MD  Primary Cardiologist: Larae Grooms, MD  Primary Electrophysiologist:  None   Discharge Diagnoses    Active Problems:   Prosthetic aortic valve stenosis   Severe aortic stenosis   Allergies No Known Allergies  Diagnostic Studies/Procedures    Cardiac Cath 01/22/19  Mid LAD lesion is 25% stenosed.  Dist Cx lesion is 25% stenosed.  Prox RCA lesion is 25% stenosed.  Ao sat: 99%, PA sat 77%, CO 6.8 L/min; CI 3.2, mean PA 12 mm Hg; mean PCWP 7 mm Hg.   Nonobstructive CAD.  TEE 01/22/19 Severe prosthetic AS. Likely severe, very eccentric AR.  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 male who had an AVR (bicuspid aortic valve) in 46 at age 47, with a St Jude Valve; model number [id number]; serial number [id number]; blood type A+.He has been on Coumadin since that time with occasional bloody noses requiring cauterizations. In 2015 patient moved here from [place] for his job at [employer]. Also in 2015 he experienced SOB after a viral cold. Echo at that time showed normal LV function. Later in the year he had some vein removal in the left leg with Dr. Kellie Simmering. This went well with no leg swelling or bleeding issues   Patient was admitted 2/18 for Sepsis, MSSA bacteremia with endocarditis, and encephalopathy. They thought it was secondary to Lumbar discitis. Patient was discharged with 8 weeks of IV abx. Follow-up echo in May 2018 showed normal LV function with moderate AI and moderate to severe AS.Repeat echo in 12/2018 showed severe AS with concern for fixed leaflet of aortic valve. At that time he was asymptomatic. Patient was scheduled to have a hernia surgery, but had to be  delayed for echo findings. Patient was seen in the office by Dr. Irish Lack on 01/16/19 for prosthetic valve dysfunction. Cardiothoracic surgery referral was given and a CT AVR was performed. Overnight cath was scheduled. Coumadin and Lasix were held 01/19/19.   Hospital Course     Consultants: none  Patient presented to the Hospital lab 01/22/19 for TEE/Right and Left Heart Cath and Coronary Angiogram with possible PCI. 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 Patient was then brought to the cath lab and radial access established. Cath showed non obstructive CAD: mid LAD 25% stenosed, Dist Cx 25% stenosed, Prox RCA 25% stenosed, Ao sat: 99%, PA sat 77%, CO 6.8 L/min; CI 3.2, mean PA 12 mm Hg; mean PCWP 7 mm Hg. Heart rate, B/P, and oxygen remained stable throughout procedure. Coumadin was restarted and INR was 1.3.   Post procedure patient remained stable with no complications overnight. Patient was ambulating with no chest pain or shortness of breath. Cath site remained clean and dry with no signs of hematoma. HGB 12.7. INR remained at 1.3. Home dose of warfarin included 3.75 M/W/F and 7.5 T/Th/S/Su. Pharmacy was consulted and recommended 10 mg once a day for 2 days with a repeat INR on Friday. Patient called to make appointment with his coumadin clinic at [facility]. CT surgery appointment with Dr. Cyndia Bent scheduled for 7/23.   Patient was  examined by Dr. Ellyn Hack on 7/22 and feels to be stable for discharge.  _____________  Discharge Vitals Blood pressure (!) 111/56, pulse (!) 51, temperature 98 F (36.7 C), temperature source Oral, resp. rate (!) 22, height 6' (1.829 m), weight 85.6 kg, SpO2 98 %.  Filed Weights   01/22/19 0659 01/23/19 0550  Weight: 86.2 kg 85.6 kg    Labs & Radiologic Studies    CBC Recent Labs    01/22/19 1034 01/23/19 0409  WBC   --  8.1  HGB 12.2* 12.7*  HCT 36.0* 38.5*  MCV  --  94.1  PLT  --  831   Basic Metabolic Panel Recent Labs    01/22/19 0710 01/22/19 1029 01/22/19 1034  NA 140 141 142  K 3.8 4.4 4.4  CL 108  --   --   CO2 23  --   --   GLUCOSE 98  --   --   BUN 25*  --   --   CREATININE 0.84  --   --   CALCIUM 9.3  --   --    Liver Function Tests No results for input(s): AST, ALT, ALKPHOS, BILITOT, PROT, ALBUMIN in the last 72 hours. No results for input(s): LIPASE, AMYLASE in the last 72 hours. Cardiac Enzymes No results for input(s): CKTOTAL, CKMB, CKMBINDEX, TROPONINI in the last 72 hours. BNP Invalid input(s): POCBNP D-Dimer No results for input(s): DDIMER in the last 72 hours. Hemoglobin A1C No results for input(s): HGBA1C in the last 72 hours. Fasting Lipid Panel No results for input(s): CHOL, HDL, LDLCALC, TRIG, CHOLHDL, LDLDIRECT in the last 72 hours. Thyroid Function Tests No results for input(s): TSH, T4TOTAL, T3FREE, THYROIDAB in the last 72 hours.  Invalid input(s): FREET3 _____________  Dg Chest 2 View  Result Date: 01/02/2019 CLINICAL DATA:  Therapeutic drug monitoring. EXAM: CHEST - 2 VIEW COMPARISON:  Chest x-ray dated August 25, 2016. FINDINGS: Interval removal of the right upper extremity PICC line. Prior aortic valve replacement. The heart size and mediastinal contours are within normal limits. Normal pulmonary vascularity. No focal consolidation, pleural effusion, or pneumothorax. No acute osseous abnormality. IMPRESSION: No active cardiopulmonary disease. Electronically Signed   By: Titus Dubin M.D.   On: 01/02/2019 07:45   Ct Coronary Morph W/cta Cor W/score W/ca W/cm &/or Wo/cm  Addendum Date: 01/18/2019   ADDENDUM REPORT: 01/18/2019 05:48 CLINICAL DATA:  86 -year-old male with h/o bicuspid aortic valve, s/p AVR with a mechanical bi-leaflet st Jude Valve in 1983, h/o MSSA endocarditis treated with antibiotics, now with severe aortic stenosis. EXAM: Cardiac  TAVR CT TECHNIQUE: The patient was scanned on a [employer]. A 120 kV retrospective scan was triggered in the descending thoracic aorta at 111 HU's. Gantry rotation speed was 250 msecs and collimation was .6 mm. No beta blockade or nitro were given. The 3D data set was reconstructed in 5% intervals of the R-R cycle. Systolic and diastolic phases were analyzed on a dedicated work station using MPR, MIP and VRT modes. The patient received 80 cc of contrast. FINDINGS: Aortic Valve: A mechanical bi-leaflet valve is present in the aortic position with good leaflet excursion and no limitation in motion. There are three pseudoaneurysms along the infero-posterior border of the valve ring that are dynamically enlarging in systole with the largest pseudoaneurysm measuring 30 x 27 x 17 mm. Aorta: Mild ascending aortic aneurysm with maximum diameter 41 mm, mild diffuse atherosclerotic plaque and calcifications and no dissection. Sinotubular Junction: 36 x  36 mm Ascending Thoracic Aorta: 41 x 40 mm Aortic Arch: 29 x 28 mm Descending Thoracic Aorta: 27 x 27 mm Sinus of Valsalva Measurements: 42 x 40 mm Mechanical Valve Ring Measurements: Diameter: 24 x 24 mm Perimeter: 73 mm Area: 417 mm2 Coronary Arteries: Normal origin, the study performed without use of NTG. IMPRESSION: 1. A mechanical bi-leaflet valve is present in the aortic position with good leaflet excursion and no limitation in motion. There are three pseudoaneurysms along the infero-posterior border of the valve ring that are dynamically enlarging in systole with the largest pseudoaneurysm measuring 30 x 27 x 17 mm. The smaller ones measure 17 mm and 15 mm in diameter. These are most probably result of the prior prosthesis endocarditis. 2. Aorta: Mild ascending aortic aneurysm with maximum diameter 41 mm, mild diffuse atherosclerotic plaque and calcifications and no dissection. 3. No thrombus in the left atrial appendage. Electronically Signed   By: Ena Dawley   On: 01/18/2019 05:48   Result Date: 01/18/2019 EXAM: OVER-READ INTERPRETATION  CT CHEST The following report is an over-read performed by radiologist Dr. Salvatore Marvel of [facility], [place] on 01/17/2019. This over-read does not include interpretation of cardiac or coronary anatomy or pathology. The cardiac CTA interpretation by the cardiologist is attached. COMPARISON:  01/02/2019 chest radiograph. FINDINGS: Please see the separate concurrent chest CT angiogram report for details. IMPRESSION: Please see the separate concurrent chest CT angiogram report for details. Electronically Signed: By: Ilona Sorrel M.D. On: 01/17/2019 11:41   Ct Angio Chest Aorta W/cm &/or Wo/cm  Result Date: 01/17/2019 CLINICAL DATA:  Severe aortic stenosis.  Pre-TAVR evaluation. EXAM: CT ANGIOGRAPHY CHEST, ABDOMEN AND PELVIS TECHNIQUE: Multidetector CT imaging through the chest, abdomen and pelvis was performed using the standard protocol during bolus administration of intravenous contrast. Multiplanar reconstructed images and MIPs were obtained and reviewed to evaluate the vascular anatomy. CONTRAST:  19mL OMNIPAQUE IOHEXOL 350 MG/ML SOLN COMPARISON:  01/01/2019 chest radiograph. FINDINGS: CTA CHEST FINDINGS Cardiovascular: Mild cardiomegaly. No significant pericardial effusion/thickening. Left anterior descending coronary atherosclerosis. Aortic valve prosthesis is in place. Atherosclerotic thoracic aorta with ectatic 4.4 cm ascending thoracic aorta. Normal caliber pulmonary arteries. No central pulmonary emboli. Mediastinum/Nodes: Coarsely calcified subcentimeter posterior left thyroid nodule. Unremarkable esophagus. No pathologically enlarged axillary, mediastinal or hilar lymph nodes. Lungs/Pleura: No pneumothorax. No pleural effusion. Calcified 8 mm left lower lobe granuloma. No acute consolidative airspace disease, lung masses or significant pulmonary nodules. Musculoskeletal: No aggressive appearing focal osseous  lesions. Intact sternotomy wires. Mild thoracic spondylosis. CTA ABDOMEN AND PELVIS FINDINGS Hepatobiliary: Normal liver with no liver mass. Normal gallbladder with no radiopaque cholelithiasis. No biliary ductal dilatation. Pancreas: Normal, with no mass or duct dilation. Spleen: Normal size spleen. Granulomatous calcifications scattered throughout the spleen. No splenic mass. Adrenals/Urinary Tract: Normal adrenals. No hydronephrosis. Exophytic simple 2.7 cm posterior lower right renal cyst. Subcentimeter hypodense lower right renal cortical lesion is too small to characterize and requires no follow-up. No additional contour deforming renal lesions. Normal bladder. Stomach/Bowel: Normal non-distended stomach. Normal caliber small bowel with no small bowel wall thickening. Normal appendix. Normal large bowel with no diverticulosis, large bowel wall thickening or pericolonic fat stranding. Vascular/Lymphatic: Atherosclerotic abdominal aorta with 3.1 cm infrarenal abdominal aortic aneurysm. Patent portal, splenic and renal veins. No pathologically enlarged lymph nodes in the abdomen or pelvis. Reproductive: Mild prostatomegaly. Other: No pneumoperitoneum, ascites or focal fluid collection. Musculoskeletal: No aggressive appearing focal osseous lesions. Bilateral L5 pars defects. Marked degenerative disc disease at  L5-S1. VASCULAR MEASUREMENTS PERTINENT TO TAVR: AORTA: Minimal Aortic Diameter-21.5 x 20.6 mm Severity of Aortic Calcification-mild RIGHT PELVIS: Right Common Iliac Artery - Minimal Diameter-13.0 x 12.0 mm Tortuosity-mild Calcification-moderate Right External Iliac Artery - Minimal Diameter-10.8 x 10.4 mm Tortuosity-severe Calcification-none Right Common Femoral Artery - Minimal Diameter-10.6 x 10.0 mm Tortuosity-mild Calcification-mild LEFT PELVIS: Left Common Iliac Artery - Minimal Diameter-11.7 x 11.1 mm Tortuosity-mild Calcification-moderate Left External Iliac Artery - Minimal Diameter-11.4 x 11.2 mm  Tortuosity-severe Calcification-minimal Left Common Femoral Artery - Minimal Diameter-9.5 x 9.5 mm Tortuosity-mild Calcification-mild Review of the MIP images confirms the above findings. IMPRESSION: 1. Vascular findings and measurements pertinent to potential TAVR procedure, as detailed. 2. Mild cardiomegaly. Aortic valve prosthesis in place. One vessel coronary atherosclerosis. 3. Ectatic 4.4 cm ascending thoracic aorta. Recommend annual imaging followup by CTA or MRA. This recommendation follows 2010 ACCF/AHA/AATS/ACR/ASA/SCA/SCAI/SIR/STS/SVM Guidelines for the Diagnosis and Management of Patients with Thoracic Aortic Disease. Circulation. 2010; 121: E527-P824. 4. Infrarenal 3.1 cm Abdominal Aortic Aneurysm (ICD10-I71.9). Recommend follow-up aortic ultrasound in 3 years. This recommendation follows ACR consensus guidelines: White Paper of the ACR Incidental Findings Committee II on Vascular Findings. J Am Coll Radiol 2013; 23:536-144. 5. Aortic Atherosclerosis (ICD10-I70.0). Additional chronic findings as detailed. Electronically Signed   By: Ilona Sorrel M.D.   On: 01/17/2019 12:24   Ct Angio Abd/pel W/ And/or W/o  Result Date: 01/17/2019 CLINICAL DATA:  Severe aortic stenosis.  Pre-TAVR evaluation. EXAM: CT ANGIOGRAPHY CHEST, ABDOMEN AND PELVIS TECHNIQUE: Multidetector CT imaging through the chest, abdomen and pelvis was performed using the standard protocol during bolus administration of intravenous contrast. Multiplanar reconstructed images and MIPs were obtained and reviewed to evaluate the vascular anatomy. CONTRAST:  16mL OMNIPAQUE IOHEXOL 350 MG/ML SOLN COMPARISON:  01/01/2019 chest radiograph. FINDINGS: CTA CHEST FINDINGS Cardiovascular: Mild cardiomegaly. No significant pericardial effusion/thickening. Left anterior descending coronary atherosclerosis. Aortic valve prosthesis is in place. Atherosclerotic thoracic aorta with ectatic 4.4 cm ascending thoracic aorta. Normal caliber pulmonary arteries.  No central pulmonary emboli. Mediastinum/Nodes: Coarsely calcified subcentimeter posterior left thyroid nodule. Unremarkable esophagus. No pathologically enlarged axillary, mediastinal or hilar lymph nodes. Lungs/Pleura: No pneumothorax. No pleural effusion. Calcified 8 mm left lower lobe granuloma. No acute consolidative airspace disease, lung masses or significant pulmonary nodules. Musculoskeletal: No aggressive appearing focal osseous lesions. Intact sternotomy wires. Mild thoracic spondylosis. CTA ABDOMEN AND PELVIS FINDINGS Hepatobiliary: Normal liver with no liver mass. Normal gallbladder with no radiopaque cholelithiasis. No biliary ductal dilatation. Pancreas: Normal, with no mass or duct dilation. Spleen: Normal size spleen. Granulomatous calcifications scattered throughout the spleen. No splenic mass. Adrenals/Urinary Tract: Normal adrenals. No hydronephrosis. Exophytic simple 2.7 cm posterior lower right renal cyst. Subcentimeter hypodense lower right renal cortical lesion is too small to characterize and requires no follow-up. No additional contour deforming renal lesions. Normal bladder. Stomach/Bowel: Normal non-distended stomach. Normal caliber small bowel with no small bowel wall thickening. Normal appendix. Normal large bowel with no diverticulosis, large bowel wall thickening or pericolonic fat stranding. Vascular/Lymphatic: Atherosclerotic abdominal aorta with 3.1 cm infrarenal abdominal aortic aneurysm. Patent portal, splenic and renal veins. No pathologically enlarged lymph nodes in the abdomen or pelvis. Reproductive: Mild prostatomegaly. Other: No pneumoperitoneum, ascites or focal fluid collection. Musculoskeletal: No aggressive appearing focal osseous lesions. Bilateral L5 pars defects. Marked degenerative disc disease at L5-S1. VASCULAR MEASUREMENTS PERTINENT TO TAVR: AORTA: Minimal Aortic Diameter-21.5 x 20.6 mm Severity of Aortic Calcification-mild RIGHT PELVIS: Right Common Iliac Artery  - Minimal Diameter-13.0 x 12.0 mm Tortuosity-mild Calcification-moderate Right External  Iliac Artery - Minimal Diameter-10.8 x 10.4 mm Tortuosity-severe Calcification-none Right Common Femoral Artery - Minimal Diameter-10.6 x 10.0 mm Tortuosity-mild Calcification-mild LEFT PELVIS: Left Common Iliac Artery - Minimal Diameter-11.7 x 11.1 mm Tortuosity-mild Calcification-moderate Left External Iliac Artery - Minimal Diameter-11.4 x 11.2 mm Tortuosity-severe Calcification-minimal Left Common Femoral Artery - Minimal Diameter-9.5 x 9.5 mm Tortuosity-mild Calcification-mild Review of the MIP images confirms the above findings. IMPRESSION: 1. Vascular findings and measurements pertinent to potential TAVR procedure, as detailed. 2. Mild cardiomegaly. Aortic valve prosthesis in place. One vessel coronary atherosclerosis. 3. Ectatic 4.4 cm ascending thoracic aorta. Recommend annual imaging followup by CTA or MRA. This recommendation follows 2010 ACCF/AHA/AATS/ACR/ASA/SCA/SCAI/SIR/STS/SVM Guidelines for the Diagnosis and Management of Patients with Thoracic Aortic Disease. Circulation. 2010; 121: R427-C623. 4. Infrarenal 3.1 cm Abdominal Aortic Aneurysm (ICD10-I71.9). Recommend follow-up aortic ultrasound in 3 years. This recommendation follows ACR consensus guidelines: White Paper of the ACR Incidental Findings Committee II on Vascular Findings. J Am Coll Radiol 2013; 76:283-151. 5. Aortic Atherosclerosis (ICD10-I70.0). Additional chronic findings as detailed. Electronically Signed   By: Ilona Sorrel M.D.   On: 01/17/2019 12:24   Disposition   Pt is being discharged home today in good condition.  Follow-up Plans & Appointments    Follow-up Information    Gaye Pollack, MD Follow up on 01/24/2019.   Specialty: Cardiothoracic Surgery Why: Appointment at 10:00 am Contact information: [address] [place] [address] [phone]        [name], MD Follow up on 02/12/2021.     Specialties: Cardiology, Radiology, Interventional Cardiology Why:  at 11:00 am Contact information: [address]  [place] [address] [place] Follow up on 01/25/2019.   Why: INR recheck Contact information: [place] [address] [phone]          Discharge Instructions    Call MD for:  redness, tenderness, or signs of infection (pain, swelling, redness, odor or green/yellow discharge around incision site)   Complete by: As directed    Diet - low sodium heart healthy   Complete by: As directed    Discharge instructions   Complete by: As directed    No driving for 1 day. No lifting over 5 lbs for 5 days. No sexual activity for 4 weeks. You may not return to work until cleared by your cardiologist. Keep procedure site clean & dry. If you notice increased pain, swelling, bleeding or pus, call/return!  You may shower, but no soaking baths/hot tubs/pools for 1 week.   Increase activity slowly   Complete by: As directed       Discharge Medications   Allergies as of 01/23/2019   No Known Allergies     Medication List    STOP taking these medications   metoprolol tartrate 50 MG tablet Commonly known as: LOPRESSOR     TAKE these medications   albuterol 108 (90 Base) MCG/ACT inhaler Commonly known as: VENTOLIN HFA Inhale 2 puffs into the lungs every 6 (six) hours as needed for wheezing or shortness of breath.   amoxicillin 500 MG capsule Commonly known as: AMOXIL Take 2,000 mg by mouth See admin instructions. Take 2000 mg 1 hour prior to dental procedures   CENTRUM SILVER ADULT 50+ PO Take 1 tablet by mouth daily with breakfast.   cetirizine 10 MG tablet Commonly known as: ZYRTEC Take 10 mg by mouth daily.  Dexilant 60 MG capsule Generic drug: dexlansoprazole Take 60 mg by mouth daily.   diclofenac sodium 1 % Gel Commonly known as: VOLTAREN Apply 4 g  topically 4 (four) times daily as needed (back pain).   diphenhydramine-acetaminophen 25-500 MG Tabs tablet Commonly known as: TYLENOL PM Take 1 tablet by mouth at bedtime.   ferrous sulfate 324 (65 Fe) MG Tbec Take 324 mg by mouth daily.   folic acid 1 MG tablet Commonly known as: FOLVITE Take 1 mg by mouth daily.   furosemide 40 MG tablet Commonly known as: LASIX Take 20 mg by mouth daily.   lidocaine 4 % cream Commonly known as: LMX Apply 1 application topically as needed (back pain).   Linzess 72 MCG capsule Generic drug: linaclotide Take 72 mcg by mouth daily.   METAMUCIL PO Take 1 Dose by mouth daily.   methotrexate 2.5 MG tablet Commonly known as: RHEUMATREX Take 15 mg by mouth every Tuesday.   omeprazole 20 MG capsule Commonly known as: PRILOSEC Take 20 mg by mouth daily.   potassium chloride SA 20 MEQ tablet Commonly known as: K-DUR Take 1 tablet (20 mEq total) by mouth daily as needed. TAKE ONLY if taking furosemide (lasix) What changed:   when to take this  additional instructions   predniSONE 5 MG (48) Tbpk tablet Commonly known as: STERAPRED UNI-PAK 48 TAB Take 5-15 mg by mouth See admin instructions. Taking 15 mg daily until 01/21/19, 10 mg daily until 01/28/19, 5 mg daily until l 02/04/19.   PreviDent 5000 Booster Plus 1.1 % Pste Generic drug: Sodium Fluoride Place 1 application onto teeth at bedtime.   rosuvastatin 5 MG tablet Commonly known as: CRESTOR Take 5 mg by mouth at bedtime.   Simethicone 250 MG Caps Take 250 mg by mouth daily.   TURMERIC PO Take 538 mg by mouth daily.   valsartan 160 MG tablet Commonly known as: DIOVAN Take 1 tablet (160 mg total) by mouth daily.   warfarin 7.5 MG tablet Commonly known as: COUMADIN Take 3.75-7.5 mg by mouth See admin instructions. Take 7.5mg  at night on Sunday, Tuesday, Thursday, and Saturday. 3.75mg  at night on Monday, Wednesday, and Fridays Notes to patient: PLEASE FOR THE NEXT 2 DAYS  TAKE 10mg  DAILY UNTIL FOLLOW UP INR CHECK 7/24   zolpidem 10 MG tablet Commonly known as: AMBIEN Take 10 mg by mouth at bedtime.        Acute coronary syndrome (MI, NSTEMI, STEMI, etc) this admission?: No.    Outstanding Labs/Studies   INR check Friday [facility] in [place]  Duration of Discharge Encounter   Greater than 30 minutes including physician time.  Signed, Rubbie Goostree Ninfa Meeker, PA-C 01/23/2019, 12:31 PM

## 2019-01-23 NOTE — Progress Notes (Signed)
ANTICOAGULATION CONSULT NOTE   Pharmacy Consult for Heparin Indication: St Jude AVR  No Known Allergies  Patient Measurements: Height: 6' (182.9 cm) Weight: 190 lb (86.2 kg) IBW/kg (Calculated) : 77.6 Heparin Dosing Weight: 86.2 kg  Vital Signs: Temp: 98.5 F (36.9 C) (07/21 2154) Temp Source: Oral (07/21 2154) BP: 113/62 (07/21 2154) Pulse Rate: 55 (07/21 2154)  Labs: Recent Labs    01/22/19 0710  01/22/19 1029 01/22/19 1034 01/23/19 0409  HGB  --    < > 12.2* 12.2* 12.7*  HCT  --   --  36.0* 36.0* 38.5*  PLT  --   --   --   --  199  LABPROT 15.6*  --   --   --  16.1*  INR 1.3*  --   --   --  1.3*  HEPARINUNFRC  --   --   --   --  0.29*  CREATININE 0.84  --   --   --   --    < > = values in this interval not displayed.    Estimated Creatinine Clearance: 98.8 mL/min (by C-G formula based on SCr of 0.84 mg/dL).   Medical History: Past Medical History:  Diagnosis Date  . Acute encephalopathy    Likely acute metabolic encephalopathy secondary to sepsis  . Anemia   . Asthma   . Bloody nose    occ  . Cold   . Decreased anal sphincter tone   . Fatty liver 2018  . GERD (gastroesophageal reflux disease)   . Heart murmur   . Heart valve replaced    1983   st jude  . History of aortic insufficiency   . History of aortic stenosis   . History of endocarditis    prosthetic aortic valve endocarditis   . History of shingles 2019  . Hypertension   . Insomnia   . Lumbar discitis   . Mixed hyperlipidemia   . MSSA bacteremia 2018   Sepsis  . Nasal septal deviation   . Obesity   . Pre-diabetes   . Seborrheic keratosis   . Shortness of breath dyspnea    Chronic  . Sleep apnea    cpap  . Umbilical hernia   . Varicose veins     Assessment: CC/HPI: Aortic stenosis with prosthetic valve dysfunction  PMH: AVR in 1983 at age 63, with a St Jude Valve on Coumadin,  MSSA bacteremia and endocarditis 08/2016 secondary to Lumbar discitis, abx x 6 wks, new RA dx 2020,  anemia, asthma, fatty liver, GERD, shingles, HTN, insomnia, HLD, nasal septal deviation, obesity, pre-DM, seborrheic keratosis, OSA, umbilical hernia, vericose veins  Significant events: occasional bloody nose.   Anticoag: Prosthetic aortic valve. Start hep 8 hrs after sheath out. INR today 1.3. Cath 7/21: Nonobstructive CAD. Radial sheath out 1045. - Coumadin PTA: 7.5mg  TTSS, 3.75mg  MWF (INR 2.8 on 7/15). Held for cath  7/22 AM update: initial heparin level is just below goal, no issues per RN, CBC stable  Goal of Therapy:  Heparin level 0.3-0.7 units/ml Monitor platelets by anticoagulation protocol: Yes   Plan:  Inc heparin to 1350 units/hr Re-check heparin level in 6-8 hours  Narda Bonds, PharmD, [place] Pharmacist Phone: [phone]

## 2019-01-23 NOTE — Progress Notes (Signed)
Progress Note  Patient Name: Jeffery Whitehead Date of Encounter: 01/23/2019  Primary Cardiologist: Larae Grooms, MD   Subjective   Patient feeling well today. Has been ambulating with no symptoms. Denies chest pain/sob. Right radial cath site clean and dry.   Inpatient Medications    Scheduled Meds: . ferrous sulfate  325 mg Oral Q breakfast  . linaclotide  72 mcg Oral Daily  . loratadine  10 mg Oral Daily  . [START ON 01/29/2019] methotrexate  15 mg Oral Q Tue  . pantoprazole  40 mg Oral Daily  . predniSONE  10 mg Oral Q breakfast   Followed by  . [START ON 01/29/2019] predniSONE  5 mg Oral Q breakfast  . rosuvastatin  5 mg Oral QHS  . sodium chloride flush  3 mL Intravenous Q12H  . sodium chloride flush  3 mL Intravenous Q12H  . warfarin  3.75 mg Oral Q M,W,F-1800  . warfarin  7.5 mg Oral Q T,Th,S,Su-1800  . Warfarin - Physician Dosing Inpatient   Does not apply q1800  . zolpidem  10 mg Oral QHS   Continuous Infusions: . sodium chloride    . heparin 1,350 Units/hr (01/23/19 0500)   PRN Meds: sodium chloride, acetaminophen, albuterol, ondansetron (ZOFRAN) IV, sodium chloride flush   Vital Signs    Vitals:   01/22/19 1437 01/22/19 1506 01/22/19 2154 01/23/19 0550  BP: 109/62 108/80 113/62 (!) 111/56  Pulse: (!) 57 (!) 59 (!) 55 (!) 51  Resp:      Temp:   98.5 F (36.9 C) 98 F (36.7 C)  TempSrc:   Oral Oral  SpO2: 97% 97% 98% 98%  Weight:    85.6 kg  Height:        Intake/Output Summary (Last 24 hours) at 01/23/2019 0932 Last data filed at 01/23/2019 0900 Gross per 24 hour  Intake 1388.01 ml  Output 400 ml  Net 988.01 ml   Last 3 Weights 01/23/2019 01/22/2019 01/16/2019  Weight (lbs) 188 lb 12.8 oz 190 lb 190 lb  Weight (kg) 85.639 kg 86.183 kg 86.183 kg      Telemetry    NSR, HR 50-60s, no arrhythmias noted - Personally Reviewed  ECG    No new - Personally Reviewed  Physical Exam   GEN: No acute distress.   Neck: No JVD Cardiac: RRR,  Systolic murmur, rubs, or gallops.  Respiratory: Clear to auscultation bilaterally. GI: Soft, nontender, non-distended  MS: No edema; No deformity; right radial cath site clean and dry no hematoma, pulses 2+ Neuro:  Nonfocal  Psych: Normal affect   Labs    High Sensitivity Troponin:  No results for input(s): TROPONINIHS in the last 720 hours.    Cardiac EnzymesNo results for input(s): TROPONINI in the last 168 hours. No results for input(s): TROPIPOC in the last 168 hours.   Chemistry Recent Labs  Lab 01/16/19 1532 01/22/19 0710 01/22/19 1029 01/22/19 1034  NA 139 140 141 142  K 4.5 3.8 4.4 4.4  CL 107* 108  --   --   CO2 24 23  --   --   GLUCOSE 111* 98  --   --   BUN 31* 25*  --   --   CREATININE 0.85 0.84  --   --   CALCIUM 10.1 9.3  --   --   GFRNONAA 93 >60  --   --   GFRAA 107 >60  --   --   ANIONGAP  --  9  --   --  Hematology Recent Labs  Lab 01/16/19 1532 01/22/19 1029 01/22/19 1034 01/23/19 0409  WBC 10.7  --   --  8.1  RBC 4.37  --   --  4.09*  HGB 13.5 12.2* 12.2* 12.7*  HCT 39.4 36.0* 36.0* 38.5*  MCV 90  --   --  94.1  MCH 30.9  --   --  31.1  MCHC 34.3  --   --  33.0  RDW 15.4  --   --  15.0  PLT 250  --   --  199    BNPNo results for input(s): BNP, PROBNP in the last 168 hours.   DDimer No results for input(s): DDIMER in the last 168 hours.   Radiology    No results found.  Cardiac Studies   Cardiac TAVR CT 01/17/19 1. A mechanical bi-leaflet valve is present in the aortic position with good leaflet excursion and no limitation in motion. There are three pseudoaneurysms along the infero-posterior border of the valve ring that are dynamically enlarging in systole with the largest pseudoaneurysm measuring 30 x 27 x 17 mm. The smaller ones measure 17 mm and 15 mm in diameter. These are most probably result of the prior prosthesis endocarditis.  2. Aorta: Mild ascending aortic aneurysm with maximum diameter 41 mm, mild diffuse  atherosclerotic plaque and calcifications and no dissection.  3. No thrombus in the left atrial appendage.  [place] 01/22/19  Mid LAD lesion is 25% stenosed.  Dist Cx lesion is 25% stenosed.  Prox RCA lesion is 25% stenosed.  Ao sat: 99%, PA sat 77%, CO 6.8 L/min; CI 3.2, mean PA 12 mm Hg; mean PCWP 7 mm Hg.   Nonobstructive CAD.  Patient Profile     63 y.o. male with a pmh of AVR (bicuspid aortic valve) in 87 at age 70, with a St Jude Valve; model number 25A-101; serial number [id number]. He has been on Coumadin since that time.  Assessment & Plan    1. Aortic Stenosis s/p AVR -Echo in 12/2018 showed severe AS with concern for fixed leaflet of aortic valve.  -Cath 7/21 showed nonobstructive CAD -Restart coumadin per pharmacy recommendations -Follow-up with Dr. Cyndia Bent for valve surgery scheduled for tomorrow -Likely discharge today. Patient ambulating well. No sob/cp. HGB 12.7 -Cath site clean and dry with no hematoma -Will arrange OP f/u  2. Anticoagulation - Coumadin goal INR 2.5-3.5 - INR today 1.3 -Will consult Pharmacy for Coumadin dosing. Recommended 10mg  daily with INR recheck on Friday.   3. Hyperlipidemia - LDL 50 06/23/2017   For questions or updates, please contact [place] Please consult [email or web address] for contact info under        Signed, Saxon Crosby Ninfa Meeker, PA-C  01/23/2019, 9:32 AM

## 2019-01-24 ENCOUNTER — Institutional Professional Consult (permissible substitution) (INDEPENDENT_AMBULATORY_CARE_PROVIDER_SITE_OTHER): Payer: Medicare Other | Admitting: Surgery

## 2019-01-24 ENCOUNTER — Other Ambulatory Visit: Payer: Self-pay

## 2019-01-24 ENCOUNTER — Encounter: Payer: Self-pay | Admitting: Surgery

## 2019-01-24 VITALS — BP 133/72 | HR 61 | Temp 97.9°F | Resp 18 | Ht 72.0 in | Wt 191.4 lb

## 2019-01-24 DIAGNOSIS — I351 Nonrheumatic aortic (valve) insufficiency: Secondary | ICD-10-CM

## 2019-01-24 DIAGNOSIS — I350 Nonrheumatic aortic (valve) stenosis: Secondary | ICD-10-CM

## 2019-01-24 NOTE — Progress Notes (Signed)
Cardiothoracic Surgery Consultation  PCP is Daron Offer, Earnest Conroy, MD Referring Provider is [name], MD  Chief Complaint  Patient presents with   Aortic Stenosis    new patient consultation, Cardiac CT 01/17/2019, ECHO 12/10/2018., Cath 01/22/2019    HPI:  The patient is a 63 year old gentleman with a history of hypertension, hyperlipidemia, OSA on CPAP, status post 25 mm St. Jude aortic valve replacement in 1983 by Dr. Leona Carry for bicuspid aortic valve stenosis, who presented and February 2018 with MSSA bacteremia and sepsis of unknown etiology.  A TEE at that time showed a vegetation on the ventricular surface of his aortic valve prosthesis.  He was treated with intravenous antibiotics.  During that hospitalization a few days later he developed back pain and was diagnosed with lumbar discitis.  His sepsis improved with antibiotics and was treated with a 6-week course as an outpatient.  He was never seen by cardiac surgery.  He said that he completely recovered from the infection but has continued to have some lower back problem.  He has had periodic follow-up echocardiograms since then which showed development of mild to moderate aortic insufficiency which was felt to be due to some mechanical valve dysfunction.  His most recent echocardiogram on 12/10/2018 showed moderate to severe aortic insufficiency.  The mean gradient across the aortic valve prosthesis was measured at 46 mmHg.  Left ventricular systolic function was normal with ejection fraction of 55 to 60%.  The left ventricular internal dimension was measured at 6.3 cm during diastole and 4.4 cm during systole.  This was unchanged compared to an echocardiogram 1 year ago.  He subsequently underwent a gated cardiac CTA on 01/18/2019 which showed the bileaflet mechanical valve with good leaflet excursion and no limitation in motion.  There were 3 pseudoaneurysms along the inferior posterior border of the valve ring which were  dynamically enlarging and systole.  The largest one measured 3 cm in diameter.  The smaller ones were 17 and 15 mm in diameter.  There is mild enlargement of the ascending aorta measured at 41 mm.  He underwent cardiac catheterization on 01/22/2019 which showed mild nonobstructive coronary disease.  Cardiac index was 3.2.  Pulmonary capillary wedge pressure was 7.  Mean PA pressure was 12.  He underwent a TEE at the same time which showed a mean gradient across the aortic valve prosthesis to be 42 mmHg.  There was severe paravalvular regurgitation with at least 3 areas of pseudoaneurysm formation that are lobulated and demonstrate flow into the pseudoaneurysm in both systole and diastole.  Left ventricular ejection fraction was 55 to 60%.  There was normal RV systolic function with mild right ventricular enlargement.  The patient says that he has been asymptomatic.  He was walking 4 to 5 miles per day until couple weeks ago when this diagnosed.  He denies any shortness of breath or chest pain.  He denies any dizziness or syncope.  He denies any peripheral edema.  His only current complaint is of a right inguinal hernia that causes frequent pain.  He said that several times per day he has to lay down to reduce the hernia.  He has been seeing Dr. Johney Maine and has been told that he needs to have a laparoscopic bilateral hernia repair.  He had a laparoscopic umbilical hernia repair this past year.  The patient is here today and his wife is conferenced in on her cell phone.  The patient moved here from [place] in 2015  for his job but has subsequently retired.  Past Medical History:  Diagnosis Date   Acute encephalopathy    Likely acute metabolic encephalopathy secondary to sepsis   Anemia    Asthma    Bloody nose    occ   Cold    Decreased anal sphincter tone    Fatty liver 2018   GERD (gastroesophageal reflux disease)    Heart murmur    Heart valve replaced    1983   st jude   History of  aortic insufficiency    History of aortic stenosis    History of endocarditis    prosthetic aortic valve endocarditis    History of shingles 2019   Hypertension    Insomnia    Lumbar discitis    Mixed hyperlipidemia    MSSA bacteremia 2018   Sepsis   Nasal septal deviation    Obesity    Pre-diabetes    Seborrheic keratosis    Shortness of breath dyspnea    Chronic   Sleep apnea    cpap   Umbilical hernia    Varicose veins     Past Surgical History:  Procedure Laterality Date   AORTIC VALVE REPLACEMENT  1983   St. Jude   BUBBLE STUDY  01/22/2019   Procedure: BUBBLE STUDY;  Surgeon: Elouise Munroe, MD;  Location: [facility];  Service: Cardiology;;   FLUOROSCOPY GUIDANCE N/A 08/24/2016   Procedure: Fluoroscopy Guidance;  Surgeon: Leonie Man, MD;  Location: [facility];  Service: Cardiovascular;  Laterality: N/A;   INSERTION OF MESH N/A 06/13/2018   Procedure: INSERTION OF MESH;  Surgeon: Michael Boston, MD;  Location: [facility];  Service: General;  Laterality: N/A;   Nasal cautery     RIGHT HEART CATH AND CORONARY ANGIOGRAPHY N/A 01/22/2019   Procedure: RIGHT HEART CATH AND CORONARY ANGIOGRAPHY;  Surgeon: [name], MD;  Location: [place] [facility];  Service: Cardiovascular;  Laterality: N/A;   TEE WITHOUT CARDIOVERSION N/A 08/24/2016   Procedure: TRANSESOPHAGEAL ECHOCARDIOGRAM (TEE);  Surgeon: Pixie Casino, MD;  Location: [facility];  Service: Cardiovascular;  Laterality: N/A;   TEE WITHOUT CARDIOVERSION N/A 01/22/2019   Procedure: TRANSESOPHAGEAL ECHOCARDIOGRAM (TEE);  Surgeon: Elouise Munroe, MD;  Location: [place];  Service: Cardiology;  Laterality: N/A;   TONSILLECTOMY     VEIN LIGATION AND STRIPPING Left 08/15/2014   Procedure: SAPHENOUS VEIN LIGATION AND EXCISION OF VARICOSITIES;  Surgeon: Mal Misty, MD;  Location: [place];  Service: Vascular;  Laterality: Left;   VENTRAL HERNIA REPAIR N/A 06/13/2018    Procedure: [place];  Surgeon: Michael Boston, MD;  Location: [facility];  Service: General;  Laterality: N/A;    Family History  Problem Relation Age of Onset   Heart disease Father     Social History Social History   Tobacco Use   Smoking status: Never Smoker   Smokeless tobacco: Never Used  Substance Use Topics   Alcohol use: Yes    Alcohol/week: 0.0 standard drinks    Comment: wine at night   Drug use: No    Current Outpatient Medications  Medication Sig Dispense Refill   albuterol (PROVENTIL HFA;VENTOLIN HFA) 108 (90 Base) MCG/ACT inhaler Inhale 2 puffs into the lungs every 6 (six) hours as needed for wheezing or shortness of breath. 1 Inhaler 5   amoxicillin (AMOXIL) 500 MG capsule Take 2,000 mg by mouth See admin instructions. Take 2000 mg 1 hour prior to  dental procedures  2   cetirizine (ZYRTEC) 10 MG tablet Take 10 mg by mouth daily.     DEXILANT 60 MG capsule Take 60 mg by mouth daily.     diclofenac sodium (VOLTAREN) 1 % GEL Apply 4 g topically 4 (four) times daily as needed (back pain).      diphenhydramine-acetaminophen (TYLENOL PM) 25-500 MG TABS tablet Take 1 tablet by mouth at bedtime.     ferrous sulfate 324 (65 Fe) MG TBEC Take 324 mg by mouth daily.      folic acid (FOLVITE) 1 MG tablet Take 1 mg by mouth daily.     furosemide (LASIX) 40 MG tablet Take 20 mg by mouth daily.     lidocaine (LMX) 4 % cream Apply 1 application topically as needed (back pain).     LINZESS 72 MCG capsule Take 72 mcg by mouth daily.   4   methotrexate (RHEUMATREX) 2.5 MG tablet Take 15 mg by mouth every Tuesday.     Multiple Vitamins-Minerals (CENTRUM SILVER ADULT 50+ PO) Take 1 tablet by mouth daily with breakfast.     omeprazole (PRILOSEC) 20 MG capsule Take 20 mg by mouth daily.      potassium chloride SA (K-DUR,KLOR-CON) 20 MEQ tablet Take 1 tablet (20 mEq total) by mouth daily as needed. TAKE ONLY if taking furosemide  (lasix) (Patient taking differently: Take 20 mEq by mouth daily. ) 30 tablet 11   predniSONE (STERAPRED UNI-PAK 48 TAB) 5 MG (48) TBPK tablet Take 5-15 mg by mouth See admin instructions. Taking 15 mg daily until 01/21/19, 10 mg daily until 01/28/19, 5 mg daily until l 02/04/19.     PREVIDENT 5000 BOOSTER PLUS 1.1 % PSTE Place 1 application onto teeth at bedtime.   6   Psyllium (METAMUCIL PO) Take 1 Dose by mouth daily.     rosuvastatin (CRESTOR) 5 MG tablet Take 5 mg by mouth at bedtime.      Simethicone 250 MG CAPS Take 250 mg by mouth daily.     TURMERIC PO Take 538 mg by mouth daily.     valsartan (DIOVAN) 160 MG tablet Take 1 tablet (160 mg total) by mouth daily. 90 tablet 3   warfarin (COUMADIN) 7.5 MG tablet Take 3.75-7.5 mg by mouth See admin instructions. Take 7.5mg  at night on Sunday, Tuesday, Thursday, and Saturday. 3.75mg  at night on Monday, Wednesday, and Fridays  1   zolpidem (AMBIEN) 10 MG tablet Take 10 mg by mouth at bedtime.      No current facility-administered medications for this visit.     No Known Allergies  Review of Systems  Constitutional: Negative for appetite change, chills, fatigue and fever.       Weight loss but has been trying to  HENT: Negative.  Negative for dental problem.        Last saw his dentist in June 2020  Eyes: Negative.        Occasional floaters  Respiratory: Negative for chest tightness and shortness of breath.   Cardiovascular: Negative for chest pain, palpitations and leg swelling.  Gastrointestinal: Negative.   Endocrine: Negative.   Genitourinary: Negative.   Musculoskeletal: Positive for arthralgias.  Skin: Negative.   Allergic/Immunologic: Negative.   Neurological: Negative for dizziness.       Some numbness in hands and feet  Hematological: Bruises/bleeds easily.  Psychiatric/Behavioral: Negative.     BP 133/72 (BP Location: Left Arm, Patient Position: Sitting, Cuff Size: Normal)    Pulse 61  Temp 97.9 F (36.6 C)     Resp 18    Ht 6' (1.829 m)    Wt 191 lb 6.4 oz (86.8 kg)    SpO2 98% Comment: RA   BMI 25.96 kg/m  Physical Exam Constitutional:      Appearance: Normal appearance. He is normal weight.  HENT:     Head: Normocephalic and atraumatic.     Mouth/Throat:     Mouth: Mucous membranes are moist.     Pharynx: Oropharynx is clear.  Eyes:     Extraocular Movements: Extraocular movements intact.     Conjunctiva/sclera: Conjunctivae normal.     Pupils: Pupils are equal, round, and reactive to light.  Neck:     Musculoskeletal: Normal range of motion and neck supple.     Vascular: No carotid bruit.  Cardiovascular:     Rate and Rhythm: Normal rate and regular rhythm.     Pulses: Normal pulses.     Comments: Crisp mechanical valve click. 2/6 systolic murmur along the right sternal border.  There is no diastolic murmur. Pulmonary:     Effort: Pulmonary effort is normal.     Breath sounds: Normal breath sounds.  Abdominal:     General: Abdomen is flat. Bowel sounds are normal. There is no distension.     Palpations: Abdomen is soft.     Tenderness: There is no abdominal tenderness.  Musculoskeletal:        General: No swelling.  Lymphadenopathy:     Cervical: No cervical adenopathy.  Skin:    General: Skin is warm and dry.  Neurological:     General: No focal deficit present.     Mental Status: He is alert and oriented to person, place, and time.  Psychiatric:        Mood and Affect: Mood normal.        Behavior: Behavior normal.        Thought Content: Thought content normal.        Judgment: Judgment normal.      Diagnostic Tests:  TRANSESOPHOGEAL ECHO REPORT       Patient Name:   Janie Hulan Fess Date of Exam: 01/22/2019 Medical Rec #:  [id number]     Height:       72.0 in Accession #:    [id number]    Weight:       190.0 lb Date of Birth:  [birth year]    BSA:          2.08 m Patient Age:    9 years      BP:           140/60 mmHg Patient Gender: M             HR:            54 bpm. Exam Location:  Inpatient    Procedure: Transesophageal Echo, Cardiac Doppler, Color Doppler and 3D Echo  Indications:     Aortic Valve Disorder   History:         Patient has prior history of Echocardiogram examinations, most                  recent 12/10/2018. Risk Factors: Sleep Apnea and Hypertension.                  Aortic Valve: A unknown St. Jude bileaflet mechanical aortic                  valve prosthesis valve is  present in the aortic position.                  Procedure Date: 02/03/1982 Prosthetic Aortic Valve.   Sonographer:     Raquel Sarna Senior Referring Phys:  [id number] Elouise Munroe Diagnosing Phys: Cherlynn Kaiser MD     PROCEDURE: After discussion of the risks and benefits of a TEE, an informed consent was obtained from the patient. Local oropharyngeal anesthetic was provided with Benzocaine spray. Patients was under conscious sedation during this procedure. Anesthetic  was administered intravenously by performing Physician: 16mcg of Fentanyl, 6mg  of Versed. The transesophogeal probe was passed through the esophogus of the patient. Imaged were obtained with the patient in a left lateral decubitus position. Image quality  was excellent. The patient's vital signs; including heart rate, blood pressure, and oxygen saturation; remained stable throughout the procedure. The patient developed no complications during the procedure.  IMPRESSIONS    1. The left ventricle has normal systolic function, with an ejection fraction of 55-60%. The cavity size was normal.  2. The right ventricle has normal systolc function. The cavity was mildly enlarged.  3. Left atrial size was mildly dilated.  4. A St. Jude bileaflet mechanical prosthesis valve is present in the aortic position. Procedure Date: 02/03/1982 Echo findings are consistent with stenosis and regurgitation of the aortic prosthesis.  5. Aortic valve regurgitation is severe by color flow Doppler. Severe stenosis of the  aortic valve.  6. There is mild to moderate dilatation of the ascending aorta measuring 44 mm.  7. There is no apparent rocking or dehiscence of the aortic valve prosthesis. There is severe prosthetic aortic valve dysfunction with a mean systolic gradient of 42 mmHg, Velocity 4.26 m/s, AV acceleration time of 127 msec. High velocity flow across  the valve is likely secondary to high flow state from severe paravalular regurgitation. There is likely prosthetic regurgitation by color flow Doppler on both TTE and TEE. The prosthetic regurgitant Doppler was challenging to interrogate on TEE for  severity.  8. There are at least 3 areas of pseudoaneurysm that are lobulated, demonstraing flow into the pseudoaneursym in both systole and diastole (bidirectional flow with significant turbulence), with systolic expansion demonstrated. These correspond with the  inferoposterior areas seen on CTA. Adjacent to the the more inferior pseudoaneurysm, there appears to be fisutla leading to severe paravavular regurgitation.  FINDINGS  Left Ventricle: The left ventricle has normal systolic function, with an ejection fraction of 55-60%. The cavity size was normal.  Right Ventricle: The right ventricle has normal systolic function. The cavity was mildly enlarged.  Left Atrium: Left atrial size was mildly dilated.   Right Atrium: Right atrial size was normal in size. Right atrial pressure is estimated at 10 mmHg.  Mitral Valve: The mitral valve is normal in structure. Mitral valve regurgitation is mild by color flow Doppler.  Tricuspid Valve: The tricuspid valve was normal in structure. Tricuspid valve regurgitation is trivial by color flow Doppler.  Aortic Valve: The aortic valve has been repaired/replaced Aortic valve regurgitation is severe by color flow Doppler. There is Severe stenosis of the aortic valve. A unknown St. Jude bileaflet mechanical aortic valve prosthesis valve is present in the  aortic  position. Procedure Date: 02/03/1982 Echo findings are consistent with stenosis and regurgitation of the aortic prosthesis.  Pulmonic Valve: The pulmonic valve was normal in structure. Pulmonic valve regurgitation is trivial by color flow Doppler.  Aorta: There is mild to moderate dilatation of the ascending aorta  measuring 44 mm.    +-------------+------------++  AORTIC VALVE                 +-------------+------------++  AV Vmax:      426.00 cm/s    +-------------+------------++  AV Vmean:     301.000 cm/s   +-------------+------------++  AV VTI:       1.010 m        +-------------+------------++  AV Peak Grad: 72.6 mmHg      +-------------+------------++  AV Mean Grad: 42.0 mmHg      +-------------+------------++   +------------+-------++  AORTA                  +------------+-------++  Ao Asc diam: 4.40 cm   +------------+-------++    Cherlynn Kaiser MD Electronically signed by Cherlynn Kaiser MD Signature Date/Time: 01/23/2019/5:02:23 PM                           MODIFIED REPORT This report was modified by Cherlynn Kaiser MD on 01/24/2019 due to.  Physicians  Panel Physicians Referring Physician Case Authorizing Physician  [name], MD (Primary)    Procedures  RIGHT HEART CATH AND CORONARY ANGIOGRAPHY  Conclusion    Mid LAD lesion is 25% stenosed.  Dist Cx lesion is 25% stenosed.  Prox RCA lesion is 25% stenosed.  Ao sat: 99%, PA sat 77%, CO 6.8 L/min; CI 3.2, mean PA 12 mm Hg; mean PCWP 7 mm Hg.   Nonobstructive CAD.  Restart IV heparin for valve.  Restart Coumadin.  If no bleeding issues, plan for discharge tomorrow.   Will need f/u with Dr. Cyndia Bent for valve surgery.   Surgeon Notes    01/22/2019 9:44 AM CV Procedure signed by Elouise Munroe, MD  Indications  Severe aortic stenosis [I35.0 (ICD-10-CM)]  Procedural Details  Technical Details The risks, benefits, and details of the procedure were explained to the patient.  The  patient verbalized understanding and wanted to proceed.  Informed written consent was obtained.  PROCEDURE TECHNIQUE:  After Xylocaine anesthesia, a 5 French sheath was placed in the right antecubital area. A 5 French balloontipped Swan-Ganz catheter was advanced to the pulmonary artery under fluoroscopic guidance. Hemodynamic pressures were obtained. Oxygen saturations were obtained. After Xylocaine anesthesia, a 26F sheath was placed in the right femoral artery with a single anterior needle wall stick using ultrasound guidance.   Left coronary angiography was done using a Judkins L4 guide catheter.  Right coronary angiography was done using a Judkins R4 guide catheter.  Left heart cath was not done.        Contrast: 50 cc  Estimated blood loss <50 mL.   During this procedure medications were administered to achieve and maintain moderate conscious sedation while the patient's heart rate, blood pressure, and oxygen saturation were continuously monitored and I was present face-to-face 100% of this time.  Medications (Filter: Administrations occurring from 01/22/19 0937 to 01/22/19 1055) (important)  Continuous medications are totaled by the amount administered until 01/22/19 1055.  Medication Rate/Dose/Volume Action  Date Time   Heparin (Porcine) in NaCl 1000-0.9 UT/500ML-% SOLN (mL) 500 mL Given 01/22/19 0953   Total dose as of 01/22/19 1055 500 mL Given 0953   1,000 mL        Heparin (Porcine) in NaCl 1000-0.9 UT/500ML-% SOLN (mL) 500 mL Given 01/22/19 0953   Total dose as of 01/22/19 1055        500 mL  lidocaine (PF) (XYLOCAINE) 1 % injection (mL) 1 mL Given 01/22/19 1008   Total dose as of 01/22/19 1055 6 mL Given 1010   7 mL        midazolam (VERSED) injection (mg) 1 mg Given 01/22/19 1009   Total dose as of 01/22/19 1055        1 mg        fentaNYL (SUBLIMAZE) injection (mcg) 25 mcg Given 01/22/19 1009   Total dose as of 01/22/19 1055        25 mcg        Radial  Cocktail/Verapamil only (mL) 10 mL Given 01/22/19 1025   Total dose as of 01/22/19 1055        10 mL        heparin injection (Units) 4,500 Units Given 01/22/19 1033   Total dose as of 01/22/19 1055        4,500 Units        iohexol (OMNIPAQUE) 350 MG/ML injection (mL) 50 mL Given 01/22/19 1043   Total dose as of 01/22/19 1055        50 mL        Sedation Time  Sedation Time Physician-1: 31 minutes 57 seconds  Complications  Complications documented before study signed (01/22/2019 10:96 AM)   No complications were associated with this study.  Documented by [name], MD - 01/22/2019 11:00 AM    Coronary Findings  Diagnostic Dominance: Left Left Anterior Descending  Mid LAD lesion 25% stenosed  Mid LAD lesion is 25% stenosed.  Left Circumflex  Dist Cx lesion 25% stenosed  Dist Cx lesion is 25% stenosed.  Right Coronary Artery  Prox RCA lesion 25% stenosed  Prox RCA lesion is 25% stenosed.  Intervention  No interventions have been documented. Right Heart  Right Heart Pressures Ao sat: 99%, PA sat 77%, CO 6.8 L/min; CI 3.2, mean PA 12 mm Hg; mean PCWP 7 mm Hg.  Left Heart  Aortic Valve The patient has a mechanical prosthetic aortic valve . Both leaflets appear to be moving.  Coronary Diagrams  Diagnostic Dominance: Left  Intervention  Implants   No implant documentation for this case.  Syngo Images  Show images for CARDIAC CATHETERIZATION  Images on Long Term Storage  Show images for [place], [place] to Procedure Log  Procedure Log    Hemo Data   Most Recent Value  Fick Cardiac Output 6.86 L/min  Fick Cardiac Output Index 3.29 (L/min)/BSA  RA A Wave 4 mmHg  RA V Wave 4 mmHg  RA Mean 1 mmHg  RV Systolic Pressure 26 mmHg  RV Diastolic Pressure -1 mmHg  RV EDP 4 mmHg  PA Systolic Pressure 22 mmHg  PA Diastolic Pressure 2 mmHg  PA Mean 12 mmHg  PW A Wave 11 mmHg  PW V Wave 15 mmHg  PW Mean 7 mmHg  AO Systolic Pressure 045 mmHg    AO Diastolic Pressure 54 mmHg  AO Mean 78 mmHg  QP/QS 1  TPVR Index 3.64 HRUI  TSVR Index 23.68 HRUI  PVR SVR Ratio 0.06  TPVR/TSVR Ratio 0.15       ADDENDUM REPORT: 01/18/2019 05:48  CLINICAL DATA:  9 -year-old male with h/o bicuspid aortic valve, s/p AVR with a mechanical bi-leaflet st Jude Valve in 1983, h/o MSSA endocarditis treated with antibiotics, now with severe aortic stenosis.  EXAM: Cardiac TAVR CT  TECHNIQUE: The patient was scanned on a [employer]. A 120  kV retrospective scan was triggered in the descending thoracic aorta at 111 HU's. Gantry rotation speed was 250 msecs and collimation was .6 mm. No beta blockade or nitro were given. The 3D data set was reconstructed in 5% intervals of the R-R cycle. Systolic and diastolic phases were analyzed on a dedicated work station using MPR, MIP and VRT modes. The patient received 80 cc of contrast.  FINDINGS: Aortic Valve: A mechanical bi-leaflet valve is present in the aortic position with good leaflet excursion and no limitation in motion.  There are three pseudoaneurysms along the infero-posterior border of the valve ring that are dynamically enlarging in systole with the largest pseudoaneurysm measuring 30 x 27 x 17 mm.  Aorta: Mild ascending aortic aneurysm with maximum diameter 41 mm, mild diffuse atherosclerotic plaque and calcifications and no dissection.  Sinotubular Junction: 36 x 36 mm  Ascending Thoracic Aorta: 41 x 40 mm  Aortic Arch: 29 x 28 mm  Descending Thoracic Aorta: 27 x 27 mm  Sinus of Valsalva Measurements:  42 x 40 mm  Mechanical Valve Ring Measurements:  Diameter: 24 x 24 mm  Perimeter: 73 mm  Area: 417 mm2  Coronary Arteries: Normal origin, the study performed without use of NTG.  IMPRESSION: 1. A mechanical bi-leaflet valve is present in the aortic position with good leaflet excursion and no limitation in motion. There are three  pseudoaneurysms along the infero-posterior border of the valve ring that are dynamically enlarging in systole with the largest pseudoaneurysm measuring 30 x 27 x 17 mm. The smaller ones measure 17 mm and 15 mm in diameter. These are most probably result of the prior prosthesis endocarditis.  2. Aorta: Mild ascending aortic aneurysm with maximum diameter 41 mm, mild diffuse atherosclerotic plaque and calcifications and no dissection.  3. No thrombus in the left atrial appendage.   Electronically Signed   By: Ena Dawley   On: 01/18/2019 05:48   Addended by Dorothy Spark, MD on 01/18/2019 5:50 AM    Study Result  EXAM: OVER-READ INTERPRETATION  CT CHEST  The following report is an over-read performed by radiologist Dr. Salvatore Marvel of [facility], [place] on 01/17/2019. This over-read does not include interpretation of cardiac or coronary anatomy or pathology. The cardiac CTA interpretation by the cardiologist is attached.  COMPARISON:  01/02/2019 chest radiograph.  FINDINGS: Please see the separate concurrent chest CT angiogram report for details.  IMPRESSION: Please see the separate concurrent chest CT angiogram report for details.  Electronically Signed: By: Ilona Sorrel M.D. On: 01/17/2019 11:41        Impression:  This 63 year old gentleman is status post St. 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  I suspect that he most likely developed a perivalvular leak due to his endocarditis which has progressively  enlarged with time due to weakening of that area under aortic pressure.  He does not have any clinical signs of active infection.  His left ventricular internal dimensions are enlarged but have not changed significantly over the past year.  He is asymptomatic but I think surgical repair was indicated given the severity of  his aortic insufficiency and development of pseudoaneurysms.  This will require redo sternotomy with removal of the old mechanical valve prosthesis and aortic root replacement most likely using a homograft or porcine root and debridement/closure of the pseudoaneurysms.  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  Cardiac catheterization shows mild nonobstructive coronary disease.  I reviewed the CTA and echo images with the patient and his wife by telephone.  I discussed the indications for surgery. I discussed the operative procedure with the patient and his wife including alternatives, benefits and risks; including but not limited to bleeding, blood transfusion, infection, stroke, myocardial infarction, graft failure, heart block requiring a permanent pacemaker, organ dysfunction, and death.  [name] understands and agrees to proceed.  He is most concerned about his right inguinal hernia which is causing him significant discomfort and limiting his ability to ambulate for very long.  He asked about the feasibility of having this repaired prior to his cardiac surgery.  This would allow him to ambulate postoperatively without having to worry about reducing the hernia and the risk of incarceration.  If this could be done soon and laparoscopically he will probably only have to wait a month or so after the hernia repair to have heart surgery.  He is clinically asymptomatic from his aortic valve dysfunction.  I think if he has his heart surgery first he would have to wait at least 3 months before having his hernia repaired.  I told him I would  discuss this further with Dr. Irish Lack.  I told him I would not recommend putting off his heart surgery for more than couple months at the most since he is doing well and the pseudoaneurysms may continue to enlarge.  Plan:  I will discuss the timing of surgery with Dr. Irish Lack in reference to his inguinal hernias which also require repair.  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   Gaye Pollack, MD [facility] [phone]

## 2019-01-25 DIAGNOSIS — I350 Nonrheumatic aortic (valve) stenosis: Secondary | ICD-10-CM | POA: Diagnosis not present

## 2019-01-25 DIAGNOSIS — Z7901 Long term (current) use of anticoagulants: Secondary | ICD-10-CM | POA: Diagnosis not present

## 2019-01-25 DIAGNOSIS — Z952 Presence of prosthetic heart valve: Secondary | ICD-10-CM | POA: Diagnosis not present

## 2019-01-28 ENCOUNTER — Telehealth: Payer: Self-pay | Admitting: Pharmacist

## 2019-01-28 NOTE — Telephone Encounter (Signed)
Patient with a st jude mechanical aortic valve. No risk factors Patient may hold his coumadin 5 days for hernia repair. Coumadin is managed by [facility]- his PCP

## 2019-01-29 ENCOUNTER — Telehealth: Payer: Self-pay | Admitting: Interventional Cardiology

## 2019-01-29 NOTE — Telephone Encounter (Signed)
New Message         [facility] Pre-operative Risk Assessment    Request for surgical clearance:  1. What type of surgery is being performed? Bilateral LAP. Inguinal Hernias   2. When is this surgery scheduled? 02/05/19    3. What type of clearance is required (medical clearance vs. Pharmacy clearance to hold med vs. Both)? Pharmacy   4. Are there any medications that need to be held prior to surgery and how long? Warfarin- needs cardiologist to say for how long   5. Practice name and name of physician performing surgery? [facility] Dr Johney Maine   6. What is your office phone number [phone]   7.   What is your office fax number [phone]  8.   Anesthesia type (None, local, MAC, general) ? General    Marca Ancona 01/29/2019, 9:12 AM  _________________________________________________________________   (provider comments below)

## 2019-01-29 NOTE — Telephone Encounter (Signed)
   Primary Cardiologist: Larae Grooms, MD  Chart reviewed as part of pre-operative protocol coverage. Patient was contacted 01/29/2019 in reference to pre-operative risk assessment for pending surgery as outlined below.  Jeffery Whitehead was last seen on 01/16/19 by Dr. Irish Lack via telemedicine. He has history of bicuspid AV s/p St. Jude AVR in 1983 at age 63 on Coumadin, sepsis/MSSA bacteremia/endocarditis in 08/2016 secondary to lumbar discitis, rheumatoid arthritis, hyperlipidemia, anemia, asthma, fatty liver, HTN, pre-DM, chronic dyspnea, sleep apnea. He has recently been undergoing evaluation for severe aortic stenosis and regurgitation and mild-moderate dilation of ascending aorta and development of pseudoaneurysms. Cardiac cath 01/22/19 showed mild nonobstructive CAD (25% mLAD, 25% dCx, 25% prox RCA).  The patient saw Dr. Cyndia Bent yesterday for surgical consult which reads, "He is most concerned about his right inguinal hernia which is causing him significant discomfort and limiting his ability to ambulate for very long.  He asked about the feasibility of having this repaired prior to his cardiac surgery.  This would allow him to ambulate postoperatively without having to worry about reducing the hernia and the risk of incarceration.  If this could be done soon and laparoscopically he will probably only have to wait a month or so after the hernia repair to have heart surgery.  He is clinically asymptomatic from his aortic valve dysfunction.  I think if he has his heart surgery first he would have to wait at least 3 months before having his hernia repaired.  I told him I would discuss this further with Dr. Irish Lack.  I told him I would not recommend putting off his heart surgery for more than couple months at the most since he is doing well and the pseudoaneurysms may continue to enlarge. I will discuss the timing of surgery with Dr. Irish Lack in reference to his inguinal hernias which also require repair."  Therefore, will route message to Dr. Irish Lack to see if there has been consensus about clearance. Dr. Irish Lack - Please route response to P CV DIV PREOP (the pre-op pool). Thank you. Regarding anticoagulation, in an alternate note by pharmD they recommended holding 5 days prior to surgery (INR managed by PCP @ [facility]).  Charlie Pitter, PA-C 01/29/2019, 9:29 AM

## 2019-01-29 NOTE — Telephone Encounter (Signed)
   Primary Cardiologist: Larae Grooms, MD  Chart reviewed as part of pre-operative protocol coverage. Per communication below, Dr. Irish Lack has cleared him for his hernia surgery. His anticoagulation was discussed with our office and pharmacist states that he may hold his Coumadin 5 days for hernia repair. Coumadin is managed by [facility]- his PCP. We typically advise that blood thinners be resumed when felt safe by performing physician.  I will route this recommendation to the requesting party via Epic fax function and remove from pre-op pool. Please call with questions.  I will also route to our callback assistant to call patient to make him aware that clearance was granted and to let him know of recommendation for Coumadin - he can either call PCP himself to let them know or have our office call them.  Charlie Pitter, PA-C 01/29/2019, 10:20 AM

## 2019-01-29 NOTE — Telephone Encounter (Signed)
S/w pt is aware Dr. Irish Lack cleared pt for hernia surgery and recommendations for blood thinner.  Pt will call PCP and let them know of recommendations for holding blood thinner from this office. Pt is aware Dr. Clyda Greener office was sent this information.

## 2019-01-29 NOTE — Telephone Encounter (Signed)
[name], MD  [place], Jeffery Whitehead, [facility]        Jeffery Whitehead,   He was off of his Coumadin for a cath recently. He will also need hernia surgery soon, so we will have to coordinate his Coumadin. I have cleared him for hernia surgery.   Thanks.   JV

## 2019-01-29 NOTE — Telephone Encounter (Signed)
See below regarding cardiac clearance from Dr Irish Lack.  Pharmacy clearance per Marcelle Overlie, PharmD: "Patient with a st jude mechanical aortic valve. No risk factors Patient may hold his coumadin 5 days for hernia repair. Coumadin is managed by [facility]- his PCP"

## 2019-01-30 DIAGNOSIS — I729 Aneurysm of unspecified site: Secondary | ICD-10-CM | POA: Insufficient documentation

## 2019-01-30 DIAGNOSIS — Z950 Presence of cardiac pacemaker: Secondary | ICD-10-CM | POA: Diagnosis not present

## 2019-01-30 DIAGNOSIS — I351 Nonrheumatic aortic (valve) insufficiency: Secondary | ICD-10-CM | POA: Diagnosis not present

## 2019-01-30 DIAGNOSIS — Z7901 Long term (current) use of anticoagulants: Secondary | ICD-10-CM | POA: Diagnosis not present

## 2019-01-30 DIAGNOSIS — A4101 Sepsis due to Methicillin susceptible Staphylococcus aureus: Secondary | ICD-10-CM | POA: Diagnosis not present

## 2019-01-30 DIAGNOSIS — I38 Endocarditis, valve unspecified: Secondary | ICD-10-CM | POA: Diagnosis not present

## 2019-01-30 DIAGNOSIS — I10 Essential (primary) hypertension: Secondary | ICD-10-CM | POA: Diagnosis not present

## 2019-01-30 DIAGNOSIS — I779 Disorder of arteries and arterioles, unspecified: Secondary | ICD-10-CM | POA: Diagnosis not present

## 2019-01-30 DIAGNOSIS — E782 Mixed hyperlipidemia: Secondary | ICD-10-CM | POA: Diagnosis not present

## 2019-01-30 DIAGNOSIS — R9431 Abnormal electrocardiogram [ECG] [EKG]: Secondary | ICD-10-CM | POA: Diagnosis not present

## 2019-01-30 DIAGNOSIS — M0600 Rheumatoid arthritis without rheumatoid factor, unspecified site: Secondary | ICD-10-CM | POA: Diagnosis not present

## 2019-01-30 DIAGNOSIS — T826XXD Infection and inflammatory reaction due to cardiac valve prosthesis, subsequent encounter: Secondary | ICD-10-CM | POA: Diagnosis not present

## 2019-01-30 DIAGNOSIS — T82857A Stenosis of cardiac prosthetic devices, implants and grafts, initial encounter: Secondary | ICD-10-CM | POA: Diagnosis not present

## 2019-01-30 DIAGNOSIS — I517 Cardiomegaly: Secondary | ICD-10-CM | POA: Diagnosis not present

## 2019-01-30 DIAGNOSIS — R0602 Shortness of breath: Secondary | ICD-10-CM | POA: Diagnosis not present

## 2019-01-30 DIAGNOSIS — J45909 Unspecified asthma, uncomplicated: Secondary | ICD-10-CM | POA: Diagnosis not present

## 2019-01-30 DIAGNOSIS — Z952 Presence of prosthetic heart valve: Secondary | ICD-10-CM | POA: Diagnosis not present

## 2019-01-30 DIAGNOSIS — I350 Nonrheumatic aortic (valve) stenosis: Secondary | ICD-10-CM | POA: Diagnosis not present

## 2019-01-31 NOTE — Patient Instructions (Addendum)
YOU HAVE COMPLETED YOUR COVID-19 TEST. PLEASE BEGIN THE QUARANTINE INSTRUCTIONS AS OUTLINED IN YOUR HANDOUT.                [name]    Your procedure is scheduled on: 02-05-19    Report to [facility] Main  Entrance    Report to Short Stay at 5:30 AM  PLEASE BRING CPAP [place]. DEVICE WILL BE PROVIDED!   1 VISITOR IS ALLOWED TO WAIT IN WAITING ROOM  ONLY DAY OF YOUR SURGERY.    Call this number if you have problems the morning of surgery [phone]    Remember: Do not eat food or drink liquids :After Midnight.      Take these medicines the morning of surgery with A SIP OF WATER: Cetirizine (Zyrtec), Dexilant, Omeprazole (Prilosec), PREDNISONE   BRUSH YOUR TEETH MORNING OF SURGERY AND RINSE YOUR MOUTH OUT, NO CHEWING GUM CANDY OR MINTS.                               You may not have any metal on your body including hair pins and              piercings     Do not wear jewelry, cologne, lotions, powders or deodorant              Men may shave face and neck.   Do not bring valuables to the hospital. [place].  Contacts, dentures or bridgework may not be worn into surgery.    Special Instructions: N/A              Please read over the following fact sheets you were given: _____________________________________________________________________             [facility] - Preparing for Surgery Before surgery, you can play an important role.  Because skin is not sterile, your skin needs to be as free of germs as possible.  You can reduce the number of germs on your skin by washing with CHG (chlorahexidine gluconate) soap before surgery.  CHG is an antiseptic cleaner which kills germs and bonds with the skin to continue killing germs even after washing. Please DO NOT use if you have an allergy to CHG or antibacterial soaps.  If your skin becomes reddened/irritated stop using the CHG and inform your nurse  when you arrive at Short Stay. Do not shave (including legs and underarms) for at least 48 hours prior to the first CHG shower.  You may shave your face/neck. Please follow these instructions carefully:  1.  Shower with CHG Soap the night before surgery and the  morning of Surgery.  2.  If you choose to wash your hair, wash your hair first as usual with your  normal  shampoo.  3.  After you shampoo, rinse your hair and body thoroughly to remove the  shampoo.                           4.  Use CHG as you would any other liquid soap.  You can apply chg directly  to the skin and wash                       Gently with  a scrungie or clean washcloth.  5.  Apply the CHG Soap to your body ONLY FROM THE NECK DOWN.   Do not use on face/ open                           Wound or open sores. Avoid contact with eyes, ears mouth and genitals (private parts).                       Wash face,  Genitals (private parts) with your normal soap.             6.  Wash thoroughly, paying special attention to the area where your surgery  will be performed.  7.  Thoroughly rinse your body with warm water from the neck down.  8.  DO NOT shower/wash with your normal soap after using and rinsing off  the CHG Soap.                9.  Pat yourself dry with a clean towel.            10.  Wear clean pajamas.            11.  Place clean sheets on your bed the night of your first shower and do not  sleep with pets. Day of Surgery : Do not apply any lotions/deodorants the morning of surgery.  Please wear clean clothes to the hospital/surgery center.  FAILURE TO FOLLOW THESE INSTRUCTIONS MAY RESULT IN THE CANCELLATION OF YOUR SURGERY PATIENT SIGNATURE_________________________________  NURSE SIGNATURE__________________________________  ________________________________________________________________________

## 2019-01-31 NOTE — Progress Notes (Signed)
01-29-19 (Epic) Cardiac Clearance from [facility], [place]  01-22-19 (Epic) ECHO w/ TEE  01-16-19 (Epic) EKG  6--30-20 (Epic)   12-10-18 (Epic) ECHO

## 2019-02-01 ENCOUNTER — Other Ambulatory Visit (HOSPITAL_COMMUNITY)
Admission: RE | Admit: 2019-02-01 | Discharge: 2019-02-01 | Disposition: A | Discharge status: Home / Self Care | Payer: Medicare Other | Source: Ambulatory Visit | Attending: Surgery | Admitting: Surgery

## 2019-02-01 DIAGNOSIS — Z01812 Encounter for preprocedural laboratory examination: Secondary | ICD-10-CM | POA: Insufficient documentation

## 2019-02-01 DIAGNOSIS — R634 Abnormal weight loss: Secondary | ICD-10-CM | POA: Diagnosis not present

## 2019-02-01 DIAGNOSIS — Z6825 Body mass index (BMI) 25.0-25.9, adult: Secondary | ICD-10-CM | POA: Diagnosis not present

## 2019-02-01 DIAGNOSIS — Z20828 Contact with and (suspected) exposure to other viral communicable diseases: Secondary | ICD-10-CM | POA: Diagnosis not present

## 2019-02-01 DIAGNOSIS — M2550 Pain in unspecified joint: Secondary | ICD-10-CM | POA: Diagnosis not present

## 2019-02-01 DIAGNOSIS — R5382 Chronic fatigue, unspecified: Secondary | ICD-10-CM | POA: Diagnosis not present

## 2019-02-01 DIAGNOSIS — M0600 Rheumatoid arthritis without rheumatoid factor, unspecified site: Secondary | ICD-10-CM | POA: Diagnosis not present

## 2019-02-02 LAB — SARS CORONAVIRUS 2 (TAT 6-24 HRS): SARS Coronavirus 2: NEGATIVE

## 2019-02-04 ENCOUNTER — Encounter (HOSPITAL_COMMUNITY)
Admission: RE | Admit: 2019-02-04 | Discharge: 2019-02-04 | Disposition: A | Discharge status: Home / Self Care | Payer: Medicare Other | Source: Ambulatory Visit | Attending: Surgery | Admitting: Surgery

## 2019-02-04 ENCOUNTER — Encounter (HOSPITAL_COMMUNITY): Payer: Self-pay

## 2019-02-04 ENCOUNTER — Other Ambulatory Visit: Payer: Self-pay

## 2019-02-04 DIAGNOSIS — K4190 Unilateral femoral hernia, without obstruction or gangrene, not specified as recurrent: Secondary | ICD-10-CM | POA: Diagnosis not present

## 2019-02-04 DIAGNOSIS — Z79899 Other long term (current) drug therapy: Secondary | ICD-10-CM | POA: Diagnosis not present

## 2019-02-04 DIAGNOSIS — I10 Essential (primary) hypertension: Secondary | ICD-10-CM | POA: Diagnosis not present

## 2019-02-04 DIAGNOSIS — K219 Gastro-esophageal reflux disease without esophagitis: Secondary | ICD-10-CM | POA: Diagnosis not present

## 2019-02-04 DIAGNOSIS — I2510 Atherosclerotic heart disease of native coronary artery without angina pectoris: Secondary | ICD-10-CM | POA: Diagnosis not present

## 2019-02-04 DIAGNOSIS — Z7901 Long term (current) use of anticoagulants: Secondary | ICD-10-CM | POA: Diagnosis not present

## 2019-02-04 DIAGNOSIS — K4020 Bilateral inguinal hernia, without obstruction or gangrene, not specified as recurrent: Secondary | ICD-10-CM | POA: Diagnosis not present

## 2019-02-04 DIAGNOSIS — Z952 Presence of prosthetic heart valve: Secondary | ICD-10-CM | POA: Diagnosis not present

## 2019-02-04 DIAGNOSIS — E119 Type 2 diabetes mellitus without complications: Secondary | ICD-10-CM | POA: Diagnosis not present

## 2019-02-04 DIAGNOSIS — Z7984 Long term (current) use of oral hypoglycemic drugs: Secondary | ICD-10-CM | POA: Diagnosis not present

## 2019-02-04 DIAGNOSIS — D176 Benign lipomatous neoplasm of spermatic cord: Secondary | ICD-10-CM | POA: Diagnosis not present

## 2019-02-04 DIAGNOSIS — Z7951 Long term (current) use of inhaled steroids: Secondary | ICD-10-CM | POA: Diagnosis not present

## 2019-02-04 LAB — CBC
HCT: 41 % (ref 39.0–52.0)
Hemoglobin: 13.4 g/dL (ref 13.0–17.0)
MCH: 31.6 pg (ref 26.0–34.0)
MCHC: 32.7 g/dL (ref 30.0–36.0)
MCV: 96.7 fL (ref 80.0–100.0)
Platelets: 243 10*3/uL (ref 150–400)
RBC: 4.24 MIL/uL (ref 4.22–5.81)
RDW: 14.5 % (ref 11.5–15.5)
WBC: 11.3 10*3/uL — ABNORMAL HIGH (ref 4.0–10.5)
nRBC: 0 % (ref 0.0–0.2)

## 2019-02-04 LAB — BASIC METABOLIC PANEL
Anion gap: 6 (ref 5–15)
BUN: 35 mg/dL — ABNORMAL HIGH (ref 8–23)
CO2: 24 mmol/L (ref 22–32)
Calcium: 9.1 mg/dL (ref 8.9–10.3)
Chloride: 109 mmol/L (ref 98–111)
Creatinine, Ser: 0.7 mg/dL (ref 0.61–1.24)
GFR calc Af Amer: >60 mL/min (ref >60)
GFR calc non Af Amer: >60 mL/min (ref >60)
Glucose, Bld: 104 mg/dL — ABNORMAL HIGH (ref 70–99)
Potassium: 4.5 mmol/L (ref 3.5–5.1)
Sodium: 139 mmol/L (ref 135–145)

## 2019-02-04 LAB — HEMOGLOBIN A1C
Hgb A1c MFr Bld: 4.9 % (ref 4.8–5.6)
Mean Plasma Glucose: 93.93 mg/dL

## 2019-02-04 MED ORDER — BUPIVACAINE LIPOSOME 1.3 % IJ SUSP
20.0000 mL | Freq: Once | INTRAMUSCULAR | Status: DC
  Filled 2019-02-04: qty 20

## 2019-02-04 NOTE — Anesthesia Preprocedure Evaluation (Addendum)
Anesthesia Evaluation  Patient identified by MRN, date of birth, ID band Patient awake    Reviewed: Allergy & Precautions, NPO status , Patient's Chart, lab work & pertinent test results  Airway Mallampati: I       Dental no notable dental hx. (+) Teeth Intact   Pulmonary asthma ,    Pulmonary exam normal breath sounds clear to auscultation       Cardiovascular hypertension, Pt. on medications + CAD  + Valvular Problems/Murmurs AS and AI  Rhythm:Regular Rate:Normal + Systolic murmurs and + Diastolic murmurs    Neuro/Psych Depression    GI/Hepatic GERD  Medicated and Controlled,  Endo/Other  diabetes, Oral Hypoglycemic Agents  Renal/GU      Musculoskeletal   Abdominal Normal abdominal exam  (+)   Peds  Hematology   Anesthesia Other Findings Result status: Edited Result - FINAL   TRANSESOPHOGEAL ECHO REPORT       Patient Name:   Jeffery Whitehead Date of Exam: 01/22/2019 Medical Rec #:  [id number]     Height:       72.0 in Accession #:    [id number]    Weight:       190.0 lb Date of Birth:  Dec 29, 1955    BSA:          2.08 m Patient Age:    63 years      BP:           140/60 mmHg Patient Gender: M             HR:           54 bpm. Exam Location:  Inpatient    Procedure: Transesophageal Echo, Cardiac Doppler, Color Doppler and 3D Echo  Indications:     Aortic Valve Disorder   History:         Patient has prior history of Echocardiogram examinations, most                  recent 12/10/2018. Risk Factors: Sleep Apnea and Hypertension.                  Aortic Valve: A unknown St. Jude bileaflet mechanical aortic                  valve prosthesis valve is present in the aortic position.                  Procedure Date: 02/03/1982 Prosthetic Aortic Valve.   Sonographer:     Raquel Sarna Senior Referring Phys:  [id number] Elouise Munroe Diagnosing Phys: Cherlynn Kaiser MD     PROCEDURE: After discussion of the risks  and benefits of a TEE, an informed consent was obtained from the patient. Local oropharyngeal anesthetic was provided with Benzocaine spray. Patients was under conscious sedation during this procedure. Anesthetic  was administered intravenously by performing Physician: 75mcg of Fentanyl, 6mg  of Versed. The transesophogeal probe was passed through the esophogus of the patient. Imaged were obtained with the patient in a left lateral decubitus position. Image quality  was excellent. The patient's vital signs; including heart rate, blood pressure, and oxygen saturation; remained stable throughout the procedure. The patient developed no complications during the procedure.  IMPRESSIONS    1. The left ventricle has normal systolic function, with an ejection fraction of 55-60%. The cavity size was normal.  2. The right ventricle has normal systolc function. The cavity was mildly enlarged.  3. Left atrial size was mildly dilated.  4.  A St. Jude bileaflet mechanical prosthesis valve is present in the aortic position. Procedure Date: 02/03/1982 Echo findings are consistent with stenosis and regurgitation of the aortic prosthesis.  5. Aortic valve regurgitation is severe by color flow Doppler. Severe stenosis of the aortic valve.  6. There is mild to moderate dilatation of the ascending aorta measuring 44 mm.  7. There is no apparent rocking or dehiscence of the aortic valve prosthesis. There is severe prosthetic aortic valve dysfunction with a mean systolic gradient of 42 mmHg, Velocity 4.26 m/s, AV acceleration time of 127 msec. High velocity flow across  the valve is likely secondary to high flow state from severe paravalular regurgitation. There is likely prosthetic regurgitation by color flow Doppler on both TTE and TEE. The prosthetic regurgitant Doppler was challenging to interrogate on TEE for  severity.  8. There are at least 3 areas of pseudoaneurysm that are lobulated, demonstraing flow into the  pseudoaneursym in both systole and diastole (bidirectional flow with significant turbulence), with systolic expansion demonstrated. These correspond with the  inferoposterior areas seen on CTA. Adjacent to the the more inferior pseudoaneurysm, there appears to be fisutla leading to severe paravavular regurgitation.  FINDINGS  Left Ventricle: The left ventricle has normal systolic function, with an ejection fraction of 55-60%. The cavity size was normal.  Right Ventricle: The right ventricle has normal systolic function. The cavity was mildly enlarged.  Left Atrium: Left atrial size was mildly dilated.   Right Atrium: Right atrial size was normal in size. Right atrial pressure is estimated at 10 mmHg.  Mitral Valve: The mitral valve is normal in structure. Mitral valve regurgitation is mild by color flow Doppler.  Tricuspid Valve: The tricuspid valve was normal in structure. Tricuspid valve regurgitation is trivial by color flow Doppler.  Aortic Valve: The aortic valve has been repaired/replaced Aortic valve regurgitation is severe by color flow Doppler. There is Severe stenosis of the aortic valve. A unknown St. Jude bileaflet mechanical aortic valve prosthesis valve is present in the  aortic position. Procedure Date: 02/03/1982 Echo findings are consistent with stenosis and regurgitation of the aortic prosthesis.  Pulmonic Valve: The pulmonic valve was normal in structure. Pulmonic valve regurgitation is trivial by color flow Doppler.  Aorta: There is mild to moderate dilatation of the ascending aorta measuring 44 mm.    +-------------+------------++ AORTIC VALVE                 Reproductive/Obstetrics                           Anesthesia Physical Anesthesia Plan  ASA: III  Anesthesia Plan: General   Post-op Pain Management:    Induction: Intravenous  PONV Risk Score and Plan: 4 or greater and Ondansetron, Midazolam, Treatment may vary due  to age or medical condition and Dexamethasone  Airway Management Planned: Oral ETT  Additional Equipment: Arterial line  Intra-op Plan:   Post-operative Plan: Extubation in OR  Informed Consent: I have reviewed the patients History and Physical, chart, labs and discussed the procedure including the risks, benefits and alternatives for the proposed anesthesia with the patient or authorized representative who has indicated his/her understanding and acceptance.     Dental advisory given  Plan Discussed with: CRNA  Anesthesia Plan Comments: (See PAT note 02/04/2019, Konrad Felix, PA-C)      Anesthesia Quick Evaluation

## 2019-02-04 NOTE — Progress Notes (Signed)
Anesthesia Chart Review   Case: [id number] Date/Time: 02/05/19 0720   Procedure: LAPAROSCOPIC RIGHT AND POSSIBLE LEFT INGUINAL HERNIA REPAIRS WITH MESH (Bilateral )   Anesthesia type: General   Pre-op diagnosis: RIGHT AND POSSIBLE LEFT NEW INGUINAL HERNIAS   Location: [facility]OM 01 / [facility]   Surgeon: Michael Boston, MD      DISCUSSION:63 y.o. never smoker with h/o pre-diabetes, HLD, sleep apnea w/CPAP, GERD, HTN, bicuspid AV s/p St. Jude aVR 1983, sepsis/MSSA bacteremia/endocarditis in 08/2016 secondary to lumbar disctitis, severe AS with plans for surgical repair (AV mean gradient 42.mmHg), RA, asthma, fatty liver, chronic dyspnea, mild to moderate dilation of ascending and aorta and development of pseudoaneurysms, right and possible left inguinal hernia scheduled for above procedure 02/05/2019 with Dr. Michael Boston.   Cardiac cath 01/22/19 showed mild nonobstructive CAD (25% mLAD, 25% dCx, 25% prox RCA).  Py undergoing evaluation for severe aortic stenosis by Dr. Cyndia Bent.  Per Dr. Cyndia Bent pt is clinically asymptomatic from his aortic valve dysfunction.  Surgical repair indicated given severity of aortic insufficiency and development of pseudoaneurysm.  "He (pt) is most concerned about his right inguinal hernia which is causing him significant discomfort and limiting his ability to ambulate for very long.  He asked about the feasibility of having this repaired prior to his cardiac surgery.  This would allow him to ambulate postoperatively without having to worry about reducing the hernia and the risk of incarceration.  If this could be done soon and laparoscopically he will probably only have to wait a month or so after the hernia repair to have heart surgery.  He is clinically asymptomatic from his aortic valve dysfunction.  I think if he has his heart surgery first he would have to wait at least 3 months before having his hernia repaired.  I told him I would discuss this further with Dr. Irish Lack."  Per Dr.  Irish Lack pt is clear for hernia surgery.  He has been advised by pharmacist to hold Coumadin for 5 days.  Dr. Irish Lack agrees with plan.    Anticipate pt can proceed with planned procedure barring acute status change.   VS: BP (!) 119/59 (BP Location: Left Arm)   Pulse 68   Temp 37.1 C (Oral)   Resp 18   Ht 6' (1.829 m)   Wt 87.2 kg   SpO2 100%   BMI 26.07 kg/m   PROVIDERS: Nickola Major, MD is PCP   Larae Grooms, MD is Cardiologist   Gilford Raid, MD is Cardiothoracic Surgeon LABS: Labs reviewed: Acceptable for surgery. (all labs ordered are listed, but only abnormal results are displayed)  Labs Reviewed  BASIC METABOLIC PANEL - Abnormal; Notable for the following components:      Result Value   Glucose, Bld 104 (*)    BUN 35 (*)    All other components within normal limits  CBC - Abnormal; Notable for the following components:   WBC 11.3 (*)    All other components within normal limits  HEMOGLOBIN A1C     IMAGES: CT Angio Abd/Pel 01/17/2019 IMPRESSION: 1. Vascular findings and measurements pertinent to potential TAVR procedure, as detailed. 2. Mild cardiomegaly. Aortic valve prosthesis in place. One vessel coronary atherosclerosis. 3. Ectatic 4.4 cm ascending thoracic aorta. Recommend annual imaging followup by CTA or MRA. This recommendation follows 2010 ACCF/AHA/AATS/ACR/ASA/SCA/SCAI/SIR/STS/SVM Guidelines for the Diagnosis and Management of Patients with Thoracic Aortic Disease. Circulation. 2010; 121: R916-B846. 4. Infrarenal 3.1 cm Abdominal Aortic Aneurysm (ICD10-I71.9). Recommend follow-up aortic  ultrasound in 3 years. This recommendation follows ACR consensus guidelines: White Paper of the ACR Incidental Findings Committee II on Vascular Findings. J Am Coll Radiol 2013; 26:712-458. 5. Aortic Atherosclerosis (ICD10-I70.0). Additional chronic findings as detailed.  EKG: 01/16/2019 Rate 70 bpm Normal sinus rhythm  Left ventricular  hypertrophy with repolarization abnormality   CV: Cardiac Cath 01/22/2019  Mid LAD lesion is 25% stenosed.  Dist Cx lesion is 25% stenosed.  Prox RCA lesion is 25% stenosed.  Ao sat: 99%, PA sat 77%, CO 6.8 L/min; CI 3.2, mean PA 12 mm Hg; mean PCWP 7 mm Hg.   Nonobstructive CAD.  Restart IV heparin for valve.  Restart Coumadin.  If no bleeding issues, plan for discharge tomorrow.   Will need f/u with Dr. Cyndia Bent for valve surgery.  Echo TEE 01/22/2019 IMPRESSIONS    1. The left ventricle has normal systolic function, with an ejection fraction of 55-60%. The cavity size was normal.  2. The right ventricle has normal systolc function. The cavity was mildly enlarged.  3. Left atrial size was mildly dilated.  4. A St. Jude bileaflet mechanical prosthesis valve is present in the aortic position. Procedure Date: 02/03/1982 Echo findings are consistent with stenosis and regurgitation of the aortic prosthesis.  5. Aortic valve regurgitation is severe by color flow Doppler. Severe stenosis of the aortic valve.  6. There is mild to moderate dilatation of the ascending aorta measuring 44 mm.  7. There is no apparent rocking or dehiscence of the aortic valve prosthesis. There is severe prosthetic aortic valve dysfunction with a mean systolic gradient of 42 mmHg, Velocity 4.26 m/s, AV acceleration time of 127 msec. High velocity flow across  the valve is likely secondary to high flow state from severe paravalular regurgitation. There is likely prosthetic regurgitation by color flow Doppler on both TTE and TEE. The prosthetic regurgitant Doppler was challenging to interrogate on TEE for  severity.  8. There are at least 3 areas of pseudoaneurysm that are lobulated, demonstraing flow into the pseudoaneursym in both systole and diastole (bidirectional flow with significant turbulence), with systolic expansion demonstrated. These correspond with the  inferoposterior areas seen on CTA. Adjacent to the  the more inferior pseudoaneurysm, there appears to be fisutla leading to severe paravavular regurgitation. Past Medical History:  Diagnosis Date  . Acute encephalopathy    Likely acute metabolic encephalopathy secondary to sepsis  . Anemia   . Asthma   . Bloody nose    occ  . Cold   . Decreased anal sphincter tone   . Fatty liver 2018  . GERD (gastroesophageal reflux disease)   . Heart murmur   . Heart valve replaced    1983   st jude  . History of aortic insufficiency   . History of aortic stenosis   . History of endocarditis 2017   prosthetic aortic valve endocarditis,   . History of shingles 2019  . Hypertension   . Insomnia   . Lumbar discitis   . Mixed hyperlipidemia   . MSSA bacteremia 2018   Sepsis  . Nasal septal deviation   . Obesity   . Pre-diabetes   . Seborrheic keratosis   . Shortness of breath dyspnea    Chronic  . Sleep apnea    cpap nightly   . Umbilical hernia   . Varicose veins     Past Surgical History:  Procedure Laterality Date  . AORTIC VALVE REPLACEMENT  1983   St. Jude  . BUBBLE STUDY  01/22/2019   Procedure: BUBBLE STUDY;  Surgeon: Elouise Munroe, MD;  Location: [place];  Service: Cardiology;;  . Kathryne Hitch GUIDANCE N/A 08/24/2016   Procedure: Fluoroscopy Guidance;  Surgeon: Leonie Man, MD;  Location: [place] [facility];  Service: Cardiovascular;  Laterality: N/A;  . INSERTION OF MESH N/A 06/13/2018   Procedure: INSERTION OF MESH;  Surgeon: Michael Boston, MD;  [facility];  Service: General;  Laterality: N/A;  . Nasal cautery    . RIGHT HEART CATH AND CORONARY ANGIOGRAPHY N/A 01/22/2019   Procedure: RIGHT HEART CATH AND CORONARY ANGIOGRAPHY;  Surgeon: [name], MD;  Location: [place] [facility];  Service: Cardiovascular;  Laterality: N/A;  . TEE WITHOUT CARDIOVERSION N/A 08/24/2016   Procedure: TRANSESOPHAGEAL ECHOCARDIOGRAM (TEE);  Surgeon: Pixie Casino, MD;  Location[facility];  Service: Cardiovascular;   Laterality: N/A;  . TEE WITHOUT CARDIOVERSION N/A 01/22/2019   Procedure: TRANSESOPHAGEAL ECHOCARDIOGRAM (TEE);  Surgeon: Elouise Munroe, MD;  Location: [place];  Service: Cardiology;  Laterality: N/A;  . TONSILLECTOMY    . VEIN LIGATION AND STRIPPING Left 08/15/2014   Procedure: SAPHENOUS VEIN LIGATION AND EXCISION OF VARICOSITIES;  Surgeon: Mal Misty, MD;  Location: [place];  Service: Vascular;  Laterality: Left;  Marland Kitchen VENTRAL HERNIA REPAIR N/A 06/13/2018   Procedure: LAPAROSCOPIC VENTRAL WALL HERNIA REPAIR ERAS PATHWAY;  Surgeon: Michael Boston, MD;  [facility];  Service: General;  Laterality: N/A;    MEDICATIONS: . albuterol (PROVENTIL HFA;VENTOLIN HFA) 108 (90 Base) MCG/ACT inhaler  . amoxicillin (AMOXIL) 500 MG capsule  . cetirizine (ZYRTEC) 10 MG tablet  . DEXILANT 60 MG capsule  . diclofenac sodium (VOLTAREN) 1 % GEL  . diphenhydramine-acetaminophen (TYLENOL PM) 25-500 MG TABS tablet  . ferrous sulfate 324 (65 Fe) MG TBEC  . folic acid (FOLVITE) 1 MG tablet  . furosemide (LASIX) 40 MG tablet  . lidocaine (LMX) 4 % cream  . LINZESS 72 MCG capsule  . methotrexate (RHEUMATREX) 2.5 MG tablet  . Multiple Vitamins-Minerals (CENTRUM SILVER ADULT 50+ PO)  . omeprazole (PRILOSEC) 20 MG capsule  . potassium chloride SA (K-DUR,KLOR-CON) 20 MEQ tablet  . predniSONE (STERAPRED UNI-PAK 48 TAB) 5 MG (48) TBPK tablet  . PREVIDENT 5000 BOOSTER PLUS 1.1 % PSTE  . Psyllium (METAMUCIL PO)  . rosuvastatin (CRESTOR) 5 MG tablet  . Simethicone 250 MG CAPS  . TURMERIC PO  . valsartan (DIOVAN) 160 MG tablet  . warfarin (COUMADIN) 7.5 MG tablet  . zolpidem (AMBIEN) 10 MG tablet   No current facility-administered medications for this encounter.    Derrill Memo ON 02/05/2019] bupivacaine liposome (EXPAREL) 1.3 % injection 266 mg    Maia Plan [facility] Pre-Surgical Testing [phone] 02/04/19  1:24 PM

## 2019-02-05 ENCOUNTER — Ambulatory Visit (HOSPITAL_COMMUNITY): Payer: Medicare Other | Admitting: Anesthesiology

## 2019-02-05 ENCOUNTER — Encounter (HOSPITAL_COMMUNITY): Payer: Self-pay | Admitting: *Deleted

## 2019-02-05 ENCOUNTER — Encounter (HOSPITAL_COMMUNITY)
Admission: RE | Disposition: A | Discharge status: Home / Self Care | Payer: Self-pay | Source: Home / Self Care | Attending: Surgery

## 2019-02-05 ENCOUNTER — Ambulatory Visit (HOSPITAL_COMMUNITY)
Admission: RE | Admit: 2019-02-05 | Discharge: 2019-02-06 | Disposition: A | Discharge status: Home / Self Care | Payer: Medicare Other | Attending: Surgery | Admitting: Surgery

## 2019-02-05 DIAGNOSIS — Z79899 Other long term (current) drug therapy: Secondary | ICD-10-CM | POA: Insufficient documentation

## 2019-02-05 DIAGNOSIS — I2510 Atherosclerotic heart disease of native coronary artery without angina pectoris: Secondary | ICD-10-CM | POA: Diagnosis not present

## 2019-02-05 DIAGNOSIS — Z7951 Long term (current) use of inhaled steroids: Secondary | ICD-10-CM | POA: Insufficient documentation

## 2019-02-05 DIAGNOSIS — Z7901 Long term (current) use of anticoagulants: Secondary | ICD-10-CM | POA: Diagnosis not present

## 2019-02-05 DIAGNOSIS — K4020 Bilateral inguinal hernia, without obstruction or gangrene, not specified as recurrent: Secondary | ICD-10-CM | POA: Insufficient documentation

## 2019-02-05 DIAGNOSIS — I10 Essential (primary) hypertension: Secondary | ICD-10-CM | POA: Insufficient documentation

## 2019-02-05 DIAGNOSIS — K4190 Unilateral femoral hernia, without obstruction or gangrene, not specified as recurrent: Secondary | ICD-10-CM | POA: Insufficient documentation

## 2019-02-05 DIAGNOSIS — I351 Nonrheumatic aortic (valve) insufficiency: Secondary | ICD-10-CM | POA: Diagnosis present

## 2019-02-05 DIAGNOSIS — K219 Gastro-esophageal reflux disease without esophagitis: Secondary | ICD-10-CM | POA: Insufficient documentation

## 2019-02-05 DIAGNOSIS — Z7984 Long term (current) use of oral hypoglycemic drugs: Secondary | ICD-10-CM | POA: Insufficient documentation

## 2019-02-05 DIAGNOSIS — A419 Sepsis, unspecified organism: Secondary | ICD-10-CM | POA: Diagnosis not present

## 2019-02-05 DIAGNOSIS — M6208 Separation of muscle (nontraumatic), other site: Secondary | ICD-10-CM | POA: Diagnosis present

## 2019-02-05 DIAGNOSIS — T82857A Stenosis of cardiac prosthetic devices, implants and grafts, initial encounter: Secondary | ICD-10-CM | POA: Diagnosis present

## 2019-02-05 DIAGNOSIS — G4733 Obstructive sleep apnea (adult) (pediatric): Secondary | ICD-10-CM | POA: Diagnosis present

## 2019-02-05 DIAGNOSIS — Z952 Presence of prosthetic heart valve: Secondary | ICD-10-CM | POA: Insufficient documentation

## 2019-02-05 DIAGNOSIS — M4646 Discitis, unspecified, lumbar region: Secondary | ICD-10-CM | POA: Diagnosis present

## 2019-02-05 DIAGNOSIS — D176 Benign lipomatous neoplasm of spermatic cord: Secondary | ICD-10-CM | POA: Diagnosis not present

## 2019-02-05 DIAGNOSIS — E119 Type 2 diabetes mellitus without complications: Secondary | ICD-10-CM | POA: Insufficient documentation

## 2019-02-05 HISTORY — PX: INGUINAL HERNIA REPAIR: SHX194

## 2019-02-05 LAB — PROTIME-INR
INR: 1.1 (ref 0.8–1.2)
Prothrombin Time: 14.4 seconds (ref 11.4–15.2)

## 2019-02-05 SURGERY — REPAIR, HERNIA, INGUINAL, BILATERAL, LAPAROSCOPIC
Anesthesia: General | Site: Inguinal | Laterality: Bilateral

## 2019-02-05 MED ORDER — SODIUM CHLORIDE 0.9 % IR SOLN
Status: DC | PRN
  Administered 2019-02-05: 1000 mL

## 2019-02-05 MED ORDER — LIDOCAINE 2% (20 MG/ML) 5 ML SYRINGE: INTRAMUSCULAR | Status: DC | PRN

## 2019-02-05 MED ORDER — HYDROMORPHONE HCL 1 MG/ML IJ SOLN: 0.2500 mg | INTRAMUSCULAR | Status: DC | PRN

## 2019-02-05 MED ORDER — LACTATED RINGERS IV SOLN
INTRAVENOUS | Status: DC
  Administered 2019-02-05 – 2019-02-06 (×4): via INTRAVENOUS

## 2019-02-05 MED ORDER — GABAPENTIN 300 MG PO CAPS
300.0000 mg | ORAL_CAPSULE | ORAL | Status: AC
  Administered 2019-02-05: 300 mg via ORAL
  Filled 2019-02-05: qty 1

## 2019-02-05 MED ORDER — FENTANYL CITRATE (PF) 250 MCG/5ML IJ SOLN
INTRAMUSCULAR | Status: AC
  Filled 2019-02-05: qty 5

## 2019-02-05 MED ORDER — HYDROMORPHONE HCL 1 MG/ML IJ SOLN: 0.5000 mg | INTRAMUSCULAR | Status: DC | PRN

## 2019-02-05 MED ORDER — SODIUM CHLORIDE 0.9 % IV SOLN: 250.0000 mL | INTRAVENOUS | Status: DC | PRN

## 2019-02-05 MED ORDER — DIPHENHYDRAMINE HCL 12.5 MG/5ML PO ELIX: 12.5000 mg | ORAL_SOLUTION | Freq: Four times a day (QID) | ORAL | Status: DC | PRN

## 2019-02-05 MED ORDER — ACETAMINOPHEN 500 MG PO TABS
1000.0000 mg | ORAL_TABLET | ORAL | Status: AC
  Administered 2019-02-05: 1000 mg via ORAL
  Filled 2019-02-05: qty 2

## 2019-02-05 MED ORDER — SODIUM CHLORIDE 0.9 % IV SOLN
INTRAVENOUS | Status: DC | PRN
  Administered 2019-02-05: 40 ug/min via INTRAVENOUS

## 2019-02-05 MED ORDER — LIDOCAINE HCL 2 % IJ SOLN
INTRAMUSCULAR | Status: AC
  Filled 2019-02-05: qty 20

## 2019-02-05 MED ORDER — TRAMADOL HCL 50 MG PO TABS: 50.0000 mg | ORAL_TABLET | Freq: Four times a day (QID) | ORAL | 0 refills | Status: DC | PRN

## 2019-02-05 MED ORDER — SUGAMMADEX SODIUM 200 MG/2ML IV SOLN
INTRAVENOUS | Status: DC | PRN
  Administered 2019-02-05: 200 mg via INTRAVENOUS

## 2019-02-05 MED ORDER — FUROSEMIDE 20 MG PO TABS
20.0000 mg | ORAL_TABLET | Freq: Every day | ORAL | Status: DC
  Filled 2019-02-05: qty 1

## 2019-02-05 MED ORDER — ONDANSETRON HCL 4 MG/2ML IJ SOLN
INTRAMUSCULAR | Status: AC
  Filled 2019-02-05: qty 2

## 2019-02-05 MED ORDER — DIPHENHYDRAMINE HCL 25 MG PO CAPS
25.0000 mg | ORAL_CAPSULE | Freq: Every day | ORAL | Status: DC
  Filled 2019-02-05: qty 1

## 2019-02-05 MED ORDER — ROSUVASTATIN CALCIUM 5 MG PO TABS
5.0000 mg | ORAL_TABLET | Freq: Every day | ORAL | Status: DC
  Administered 2019-02-05: 5 mg via ORAL
  Filled 2019-02-05: qty 1

## 2019-02-05 MED ORDER — LINACLOTIDE 72 MCG PO CAPS
72.0000 ug | ORAL_CAPSULE | Freq: Every day | ORAL | Status: DC
  Administered 2019-02-05: 72 ug via ORAL
  Filled 2019-02-05 (×2): qty 1

## 2019-02-05 MED ORDER — LIDOCAINE 2% (20 MG/ML) 5 ML SYRINGE
INTRAMUSCULAR | Status: DC | PRN
  Administered 2019-02-05: 60 mg via INTRAVENOUS

## 2019-02-05 MED ORDER — HYDROMORPHONE HCL 1 MG/ML IJ SOLN
INTRAMUSCULAR | Status: AC
  Filled 2019-02-05: qty 1

## 2019-02-05 MED ORDER — POLYETHYLENE GLYCOL 3350 17 G PO PACK: 17.0000 g | PACK | Freq: Two times a day (BID) | ORAL | Status: DC | PRN

## 2019-02-05 MED ORDER — LIDOCAINE 2% (20 MG/ML) 5 ML SYRINGE
INTRAMUSCULAR | Status: AC
  Filled 2019-02-05: qty 5

## 2019-02-05 MED ORDER — SUCCINYLCHOLINE CHLORIDE 200 MG/10ML IV SOSY
PREFILLED_SYRINGE | INTRAVENOUS | Status: DC | PRN
  Administered 2019-02-05: 140 mg via INTRAVENOUS

## 2019-02-05 MED ORDER — CHLORHEXIDINE GLUCONATE CLOTH 2 % EX PADS: 6.0000 | MEDICATED_PAD | Freq: Once | CUTANEOUS | Status: DC

## 2019-02-05 MED ORDER — ACETAMINOPHEN 500 MG PO TABS
ORAL_TABLET | ORAL | Status: AC
  Filled 2019-02-05: qty 1

## 2019-02-05 MED ORDER — SIMETHICONE 80 MG PO CHEW: 40.0000 mg | CHEWABLE_TABLET | Freq: Four times a day (QID) | ORAL | Status: DC | PRN

## 2019-02-05 MED ORDER — MIDAZOLAM HCL 2 MG/2ML IJ SOLN
INTRAMUSCULAR | Status: AC
  Filled 2019-02-05: qty 2

## 2019-02-05 MED ORDER — GABAPENTIN 300 MG PO CAPS
300.0000 mg | ORAL_CAPSULE | Freq: Two times a day (BID) | ORAL | Status: DC
  Administered 2019-02-05: 300 mg via ORAL
  Filled 2019-02-05: qty 1

## 2019-02-05 MED ORDER — PROPOFOL 10 MG/ML IV BOLUS
INTRAVENOUS | Status: AC
  Filled 2019-02-05: qty 20

## 2019-02-05 MED ORDER — BUPIVACAINE-EPINEPHRINE 0.25% -1:200000 IJ SOLN
INTRAMUSCULAR | Status: DC | PRN
  Administered 2019-02-05: 60 mL

## 2019-02-05 MED ORDER — SIMETHICONE 80 MG PO CHEW
240.0000 mg | CHEWABLE_TABLET | Freq: Every day | ORAL | Status: DC
  Administered 2019-02-05: 240 mg via ORAL
  Filled 2019-02-05: qty 3

## 2019-02-05 MED ORDER — KETAMINE HCL 10 MG/ML IJ SOLN
INTRAMUSCULAR | Status: DC | PRN
  Administered 2019-02-05: 40 mg via INTRAVENOUS

## 2019-02-05 MED ORDER — ROCURONIUM BROMIDE 10 MG/ML (PF) SYRINGE
PREFILLED_SYRINGE | INTRAVENOUS | Status: AC
  Filled 2019-02-05: qty 10

## 2019-02-05 MED ORDER — FENTANYL CITRATE (PF) 100 MCG/2ML IJ SOLN
INTRAMUSCULAR | Status: AC
  Filled 2019-02-05: qty 2

## 2019-02-05 MED ORDER — HYDRALAZINE HCL 20 MG/ML IJ SOLN: 5.0000 mg | INTRAMUSCULAR | Status: DC | PRN

## 2019-02-05 MED ORDER — DIPHENHYDRAMINE-APAP (SLEEP) 25-500 MG PO TABS: 1.0000 | ORAL_TABLET | Freq: Every day | ORAL | Status: DC

## 2019-02-05 MED ORDER — LIDOCAINE 4 % EX CREA: 1.0000 "application " | TOPICAL_CREAM | Freq: Four times a day (QID) | CUTANEOUS | Status: DC | PRN

## 2019-02-05 MED ORDER — ALBUMIN HUMAN 5 % IV SOLN
12.5000 g | Freq: Four times a day (QID) | INTRAVENOUS | Status: DC | PRN
  Filled 2019-02-05: qty 250

## 2019-02-05 MED ORDER — METHOCARBAMOL 500 MG PO TABS: 750.0000 mg | ORAL_TABLET | Freq: Four times a day (QID) | ORAL | Status: DC | PRN

## 2019-02-05 MED ORDER — SUCCINYLCHOLINE CHLORIDE 200 MG/10ML IV SOSY
PREFILLED_SYRINGE | INTRAVENOUS | Status: AC
  Filled 2019-02-05: qty 10

## 2019-02-05 MED ORDER — FERROUS SULFATE 325 (65 FE) MG PO TABS: 325.0000 mg | ORAL_TABLET | Freq: Every day | ORAL | Status: DC

## 2019-02-05 MED ORDER — ONDANSETRON 4 MG PO TBDP: 4.0000 mg | ORAL_TABLET | Freq: Four times a day (QID) | ORAL | Status: DC | PRN

## 2019-02-05 MED ORDER — DICLOFENAC SODIUM 1 % TD GEL: 4.0000 g | Freq: Four times a day (QID) | TRANSDERMAL | Status: DC | PRN

## 2019-02-05 MED ORDER — MIDAZOLAM HCL 2 MG/2ML IJ SOLN
INTRAMUSCULAR | Status: DC | PRN
  Administered 2019-02-05: 2 mg via INTRAVENOUS

## 2019-02-05 MED ORDER — DIPHENHYDRAMINE HCL 50 MG/ML IJ SOLN: 12.5000 mg | Freq: Four times a day (QID) | INTRAMUSCULAR | Status: DC | PRN

## 2019-02-05 MED ORDER — BUPIVACAINE-EPINEPHRINE (PF) 0.25% -1:200000 IJ SOLN
INTRAMUSCULAR | Status: AC
  Filled 2019-02-05: qty 60

## 2019-02-05 MED ORDER — BISACODYL 10 MG RE SUPP: 10.0000 mg | Freq: Every day | RECTAL | Status: DC | PRN

## 2019-02-05 MED ORDER — PROPOFOL 10 MG/ML IV BOLUS
INTRAVENOUS | Status: DC | PRN
  Administered 2019-02-05: 100 mg via INTRAVENOUS

## 2019-02-05 MED ORDER — ACETAMINOPHEN 500 MG PO TABS
1000.0000 mg | ORAL_TABLET | Freq: Three times a day (TID) | ORAL | Status: DC
  Administered 2019-02-05 – 2019-02-06 (×3): 1000 mg via ORAL
  Filled 2019-02-05 (×3): qty 2

## 2019-02-05 MED ORDER — ACETAMINOPHEN 10 MG/ML IV SOLN: 1000.0000 mg | Freq: Once | INTRAVENOUS | Status: DC | PRN

## 2019-02-05 MED ORDER — METOPROLOL TARTRATE 5 MG/5ML IV SOLN: 5.0000 mg | Freq: Four times a day (QID) | INTRAVENOUS | Status: DC | PRN

## 2019-02-05 MED ORDER — ALBUTEROL SULFATE (2.5 MG/3ML) 0.083% IN NEBU: 2.5000 mg | INHALATION_SOLUTION | Freq: Four times a day (QID) | RESPIRATORY_TRACT | Status: DC | PRN

## 2019-02-05 MED ORDER — ZOLPIDEM TARTRATE 5 MG PO TABS
10.0000 mg | ORAL_TABLET | Freq: Every day | ORAL | Status: DC
  Administered 2019-02-05: 10 mg via ORAL
  Filled 2019-02-05: qty 2

## 2019-02-05 MED ORDER — PROCHLORPERAZINE EDISYLATE 10 MG/2ML IJ SOLN: 5.0000 mg | Freq: Four times a day (QID) | INTRAMUSCULAR | Status: DC | PRN

## 2019-02-05 MED ORDER — ENOXAPARIN SODIUM 40 MG/0.4ML ~~LOC~~ SOLN: 40.0000 mg | SUBCUTANEOUS | Status: DC

## 2019-02-05 MED ORDER — SODIUM FLUORIDE 1.1 % DT PSTE: 1.0000 "application " | PASTE | Freq: Every day | DENTAL | Status: DC

## 2019-02-05 MED ORDER — BUPIVACAINE LIPOSOME 1.3 % IJ SUSP
INTRAMUSCULAR | Status: DC | PRN
  Administered 2019-02-05: 20 mL

## 2019-02-05 MED ORDER — MEPERIDINE HCL 50 MG/ML IJ SOLN: 6.2500 mg | INTRAMUSCULAR | Status: DC | PRN

## 2019-02-05 MED ORDER — CELECOXIB 200 MG PO CAPS
200.0000 mg | ORAL_CAPSULE | ORAL | Status: AC
  Administered 2019-02-05: 200 mg via ORAL
  Filled 2019-02-05: qty 1

## 2019-02-05 MED ORDER — KETAMINE HCL 10 MG/ML IJ SOLN
INTRAMUSCULAR | Status: AC
  Filled 2019-02-05: qty 1

## 2019-02-05 MED ORDER — LORATADINE 10 MG PO TABS: 10.0000 mg | ORAL_TABLET | Freq: Every day | ORAL | Status: DC

## 2019-02-05 MED ORDER — METHOTREXATE 2.5 MG PO TABS: 15.0000 mg | ORAL_TABLET | ORAL | Status: DC

## 2019-02-05 MED ORDER — LIP MEDEX EX OINT
1.0000 "application " | TOPICAL_OINTMENT | Freq: Two times a day (BID) | CUTANEOUS | Status: DC
  Administered 2019-02-05: 1 via TOPICAL
  Filled 2019-02-05: qty 7

## 2019-02-05 MED ORDER — LIDOCAINE 2% (20 MG/ML) 5 ML SYRINGE
INTRAMUSCULAR | Status: DC | PRN
  Administered 2019-02-05: 1.5 mg/kg/h via INTRAVENOUS

## 2019-02-05 MED ORDER — PANTOPRAZOLE SODIUM 40 MG PO TBEC: 80.0000 mg | DELAYED_RELEASE_TABLET | Freq: Every day | ORAL | Status: DC

## 2019-02-05 MED ORDER — ONDANSETRON HCL 4 MG/2ML IJ SOLN: 4.0000 mg | Freq: Four times a day (QID) | INTRAMUSCULAR | Status: DC | PRN

## 2019-02-05 MED ORDER — ONDANSETRON HCL 4 MG/2ML IJ SOLN
INTRAMUSCULAR | Status: DC | PRN
  Administered 2019-02-05: 4 mg via INTRAVENOUS

## 2019-02-05 MED ORDER — DEXAMETHASONE SODIUM PHOSPHATE 10 MG/ML IJ SOLN
INTRAMUSCULAR | Status: DC | PRN
  Administered 2019-02-05: 10 mg via INTRAVENOUS

## 2019-02-05 MED ORDER — PROCHLORPERAZINE MALEATE 10 MG PO TABS: 10.0000 mg | ORAL_TABLET | Freq: Four times a day (QID) | ORAL | Status: DC | PRN

## 2019-02-05 MED ORDER — DEXAMETHASONE SODIUM PHOSPHATE 10 MG/ML IJ SOLN
INTRAMUSCULAR | Status: AC
  Filled 2019-02-05: qty 1

## 2019-02-05 MED ORDER — FENTANYL CITRATE (PF) 250 MCG/5ML IJ SOLN
INTRAMUSCULAR | Status: DC | PRN
  Administered 2019-02-05: 400 ug via INTRAVENOUS
  Administered 2019-02-05 (×4): 50 ug via INTRAVENOUS

## 2019-02-05 MED ORDER — GABAPENTIN 300 MG PO CAPS: 300.0000 mg | ORAL_CAPSULE | Freq: Two times a day (BID) | ORAL | 1 refills | Status: DC

## 2019-02-05 MED ORDER — EPHEDRINE SULFATE-NACL 50-0.9 MG/10ML-% IV SOSY
PREFILLED_SYRINGE | INTRAVENOUS | Status: DC | PRN
  Administered 2019-02-05: 10 mg via INTRAVENOUS

## 2019-02-05 MED ORDER — SODIUM CHLORIDE 0.9% FLUSH: 3.0000 mL | INTRAVENOUS | Status: DC | PRN

## 2019-02-05 MED ORDER — TRAMADOL HCL 50 MG PO TABS
50.0000 mg | ORAL_TABLET | Freq: Four times a day (QID) | ORAL | Status: DC | PRN
  Administered 2019-02-05 – 2019-02-06 (×2): 100 mg via ORAL
  Filled 2019-02-05 (×2): qty 2

## 2019-02-05 MED ORDER — MAGIC MOUTHWASH
15.0000 mL | Freq: Four times a day (QID) | ORAL | Status: DC | PRN
  Filled 2019-02-05: qty 15

## 2019-02-05 MED ORDER — FOLIC ACID 1 MG PO TABS
1.0000 mg | ORAL_TABLET | Freq: Every day | ORAL | Status: DC
  Administered 2019-02-05: 1 mg via ORAL
  Filled 2019-02-05: qty 1

## 2019-02-05 MED ORDER — PROMETHAZINE HCL 25 MG/ML IJ SOLN: 6.2500 mg | INTRAMUSCULAR | Status: DC | PRN

## 2019-02-05 MED ORDER — CEFAZOLIN SODIUM-DEXTROSE 2-4 GM/100ML-% IV SOLN
2.0000 g | INTRAVENOUS | Status: AC
  Administered 2019-02-05: 2 g via INTRAVENOUS
  Filled 2019-02-05: qty 100

## 2019-02-05 MED ORDER — SODIUM CHLORIDE 0.9% FLUSH: 3.0000 mL | Freq: Two times a day (BID) | INTRAVENOUS | Status: DC

## 2019-02-05 MED ORDER — PSYLLIUM 95 % PO PACK
1.0000 | PACK | Freq: Two times a day (BID) | ORAL | Status: DC
  Filled 2019-02-05: qty 1

## 2019-02-05 MED ORDER — ROCURONIUM BROMIDE 10 MG/ML (PF) SYRINGE
PREFILLED_SYRINGE | INTRAVENOUS | Status: DC | PRN
  Administered 2019-02-05: 50 mg via INTRAVENOUS
  Administered 2019-02-05 (×2): 10 mg via INTRAVENOUS

## 2019-02-05 SURGICAL SUPPLY — 38 items
BINDER ABDOMINAL 12 ML 46-62 (SOFTGOODS) ×2 IMPLANT
CABLE HIGH FREQUENCY MONO STRZ (ELECTRODE) ×2 IMPLANT
CHLORAPREP W/TINT 26 (MISCELLANEOUS) ×2 IMPLANT
COVER SURGICAL LIGHT HANDLE (MISCELLANEOUS) ×2 IMPLANT
COVER WAND RF STERILE (DRAPES) IMPLANT
DECANTER SPIKE VIAL GLASS SM (MISCELLANEOUS) ×2 IMPLANT
DEVICE SECURE STRAP 25 ABSORB (INSTRUMENTS) ×2 IMPLANT
DRAPE WARM FLUID 44X44 (DRAPES) ×2 IMPLANT
DRSG TEGADERM 2-3/8X2-3/4 SM (GAUZE/BANDAGES/DRESSINGS) ×4 IMPLANT
DRSG TEGADERM 4X4.75 (GAUZE/BANDAGES/DRESSINGS) ×2 IMPLANT
ELECT REM PT RETURN 15FT ADLT (MISCELLANEOUS) ×2 IMPLANT
GAUZE SPONGE 2X2 8PLY STRL LF (GAUZE/BANDAGES/DRESSINGS) ×1 IMPLANT
GLOVE ECLIPSE 8.0 STRL XLNG CF (GLOVE) ×2 IMPLANT
GLOVE INDICATOR 8.0 STRL GRN (GLOVE) ×2 IMPLANT
GOWN STRL REUS W/TWL XL LVL3 (GOWN DISPOSABLE) ×4 IMPLANT
IRRIG SUCT STRYKERFLOW 2 WTIP (MISCELLANEOUS)
IRRIGATION SUCT STRKRFLW 2 WTP (MISCELLANEOUS) IMPLANT
KIT BASIN OR (CUSTOM PROCEDURE TRAY) ×2 IMPLANT
KIT TURNOVER KIT A (KITS) IMPLANT
MESH ULTRAPRO 6X6 15CM15CM (Mesh General) ×6 IMPLANT
NEEDLE INSUFFLATION 14GA 120MM (NEEDLE) ×2 IMPLANT
PAD POSITIONING PINK XL (MISCELLANEOUS) ×2 IMPLANT
SCISSORS LAP 5X35 DISP (ENDOMECHANICALS) ×2 IMPLANT
SET TUBE SMOKE EVAC HIGH FLOW (TUBING) ×2 IMPLANT
SLEEVE ADV FIXATION 5X100MM (TROCAR) ×6 IMPLANT
SPONGE GAUZE 2X2 STER 10/PKG (GAUZE/BANDAGES/DRESSINGS) ×1
SUT MNCRL AB 4-0 PS2 18 (SUTURE) ×2 IMPLANT
SUT PDS AB 1 CT1 27 (SUTURE) ×4 IMPLANT
SUT VIC AB 2-0 SH 27 (SUTURE)
SUT VIC AB 2-0 SH 27X BRD (SUTURE) IMPLANT
SUT VICRYL 0 UR6 27IN ABS (SUTURE) ×2 IMPLANT
TACKER 5MM HERNIA 3.5CML NAB (ENDOMECHANICALS) IMPLANT
TOWEL OR 17X26 10 PK STRL BLUE (TOWEL DISPOSABLE) ×2 IMPLANT
TOWEL OR NON WOVEN STRL DISP B (DISPOSABLE) ×2 IMPLANT
TRAY LAPAROSCOPIC (CUSTOM PROCEDURE TRAY) ×2 IMPLANT
TROCAR ADV FIXATION 5X100MM (TROCAR) ×2 IMPLANT
TROCAR XCEL BLUNT TIP 100MML (ENDOMECHANICALS) IMPLANT
TROCAR XCEL NON-BLD 11X100MML (ENDOMECHANICALS) ×2 IMPLANT

## 2019-02-05 NOTE — Anesthesia Postprocedure Evaluation (Signed)
Anesthesia Post Note  Patient: Sameer Teeple  Procedure(s) Performed: LAPAROSCOPIC BILATERAL INGUINAL HERNIA REPAIRS WITH MESH, RIGHT FEMORAL HERNIA REPAIR, TAPP APPROACH, TAP BLOCK BILATERALLY (Bilateral Inguinal)     Patient location during evaluation: PACU Anesthesia Type: General Level of consciousness: awake Pain management: pain level controlled Vital Signs Assessment: post-procedure vital signs reviewed and stable Respiratory status: spontaneous breathing Cardiovascular status: stable Postop Assessment: no apparent nausea or vomiting Anesthetic complications: no    Last Vitals:  Vitals:   02/05/19 1115 02/05/19 1130  BP: (!) 149/81 (!) 144/75  Pulse: 81 76  Resp: 12 12  Temp: 36.9 C 36.4 C  SpO2: 99% 93%    Last Pain:  Vitals:   02/05/19 1130  TempSrc:   PainSc: 0-No pain   Pain Goal:                   Huston Foley

## 2019-02-05 NOTE — Anesthesia Procedure Notes (Signed)
Procedure Name: Intubation Date/Time: 02/05/2019 7:54 AM Performed by: Sharlette Dense, CRNA Patient Re-evaluated:Patient Re-evaluated prior to induction Oxygen Delivery Method: Circle system utilized Preoxygenation: Pre-oxygenation with 100% oxygen Induction Type: IV induction Ventilation: Mask ventilation without difficulty and Oral airway inserted - appropriate to patient size Laryngoscope Size: Miller and 3 Grade View: Grade I Tube type: Oral Tube size: 8.0 mm Number of attempts: 1 Airway Equipment and Method: Stylet Placement Confirmation: ETT inserted through vocal cords under direct vision,  positive ETCO2 and breath sounds checked- equal and bilateral Secured at: 22 cm Tube secured with: Tape Dental Injury: Teeth and Oropharynx as per pre-operative assessment

## 2019-02-05 NOTE — Transfer of Care (Signed)
Immediate Anesthesia Transfer of Care Note  Patient: Jeffery Whitehead  Procedure(s) Performed: LAPAROSCOPIC BILATERAL INGUINAL HERNIA REPAIRS WITH MESH, RIGHT FEMORAL HERNIA REPAIR, TAPP APPROACH, TAP BLOCK BILATERALLY (Bilateral Inguinal)  Patient Location: PACU  Anesthesia Type:General  Level of Consciousness: drowsy  Airway & Oxygen Therapy: Patient Spontanous Breathing and Patient connected to face mask oxygen  Post-op Assessment: Report given to RN and Post -op Vital signs reviewed and stable  Post vital signs: Reviewed and stable  Last Vitals:  Vitals Value Taken Time  BP 167/88 02/05/19 1013  Temp    Pulse 91 02/05/19 1014  Resp 9 02/05/19 1014  SpO2 100 % 02/05/19 1014  Vitals shown include unvalidated device data.  Last Pain:  Vitals:   02/05/19 0557  TempSrc: Oral  PainSc: 0-No pain         Complications: No apparent anesthesia complications

## 2019-02-05 NOTE — Op Note (Signed)
02/05/2019  10:00 AM  PATIENT:  Jeffery Whitehead  64 y.o. male  Patient Care Team: Nickola Major, MD as PCP - General (Family Medicine) [name], MD as PCP - Cardiology (Cardiology) Michael Boston, MD as Consulting Physician (General Surgery) [name], MD as Consulting Physician (Cardiology) Jeffery Gobble, MD as Consulting Physician (Pulmonary Disease) Juanita Craver, MD as Consulting Physician (Gastroenterology)  PRE-OPERATIVE DIAGNOSIS:  RIGHT AND POSSIBLE LEFT NEW INGUINAL HERNIAS  POST-OPERATIVE DIAGNOSIS:   BILATERAL INGUINAL HERNIAS RIGHT FEMORAL HERNIA  PROCEDURE:   LAPAROSCOPIC BILATERAL INGUINAL HERNIA REPAIRS WITH MESH (TAPP APPROACH) LAPAROSCOPIC RIGHT FEMORAL HERNIA REPAIR TAP BLOCK - BILATERAL  SURGEON:  Adin Hector, MD  ASSISTANT: None  ANESTHESIA:     Regional ilioinguinal and genitofemoral and spermatic cord nerve blocks  General  Nerve block provided with liposomal bupivacaine (Experel) mixed with 0.25% bupivacaine as a Bilateral TAP block x 64mL each side at the level of the transverse abdominis & preperitoneal spaces along the flank at the anterior axillary line, from subcostal ridge to iliac crest under laparoscopic guidance    EBL:  Total I/O In: -  Out: 75 [Blood:75].  See anesthesia record  Delay start of Pharmacological VTE agent (>24hrs) due to surgical blood loss or risk of bleeding:  no  DRAINS: NONE  SPECIMEN:  NONE  DISPOSITION OF SPECIMEN:  N/A  COUNTS:  YES  PLAN OF CARE: Discharge to home after PACU  PATIENT DISPOSITION:  PACU - hemodynamically stable.  INDICATION: Pleasant gentleman with valvular disease..  Last year.  Developed an obvious right inguinal hernia and possible left inguinal hernia.  I recommended repair.  Discussed with cardiology and cardiac surgery.  They felt it was safe to proceed with hernia repair since the patient's main complaints with with his right inguinal region.  Given the  prior periumbilical mesh and recommended a transabdominal preperitoneal approach.  Possible open repair.  The anatomy & physiology of the abdominal wall and pelvic floor was discussed.  The pathophysiology of hernias in the inguinal and pelvic region was discussed.  Natural history risks such as progressive enlargement, pain, incarceration & strangulation was discussed.   Contributors to complications such as smoking, obesity, diabetes, prior surgery, etc were discussed.    I feel the risks of no intervention will lead to serious problems that outweigh the operative risks; therefore, I recommended surgery to reduce and repair the hernia.  I explained laparoscopic techniques with possible need for an open approach.  I noted usual use of mesh to patch and/or buttress hernia repair  Risks such as bleeding, infection, abscess, need for further treatment, heart attack, death, and other risks were discussed.  I noted a good likelihood this will help address the problem.   Goals of post-operative recovery were discussed as well.  Possibility that this will not correct all symptoms was explained.  I stressed the importance of low-impact activity, aggressive pain control, avoiding constipation, & not pushing through pain to minimize risk of post-operative chronic pain or injury. Possibility of reherniation was discussed.  We will work to minimize complications.     An educational handout further explaining the pathology & treatment options was given as well.  Questions were answered.  The patient expresses understanding & wishes to proceed with surgery.  OR FINDINGS: Moderate size moderate indirect inguinal hernia.  Some direct space laxity but no true hernia there.  Obvious right femoral hernia as well.  No ulcers are seen.  On the left side  subtle but definite direct space inguinal hernia.  No indirect femoral or obturator hernia.  DESCRIPTION:  The patient was identified & brought into the operating room.  The patient was positioned supine with arms tucked. SCDs were active during the entire case. The patient underwent general anesthesia without any difficulty.  The abdomen was prepped and draped in a sterile fashion. The patient's bladder was emptied.  A Surgical Timeout confirmed our plan.  I entered the abdomen through the left upper quadrant using a varies technique.  Placed on the left upper quadrant using optical entry technique.  I do adhesions of greater omentum to his periumbilical central mesh.  Extra ports were carefully placed.  Freed the peritoneum transversely in the infraumbilical region starting in the right midaxillary line and carried over towards the left mid axillary line.  Came just below the mesh in the infraumbilical region.  I focused attention on the RIGHT pelvis since that was the dominant hernia side.   I used blunt & focused sharp dissection to free the peritoneum off the flank and down to the pubic rim.  I freed the anteriolateral bladder wall off the anteriolateral pelvic wall, sparing midline attachments.   I located a swath of peritoneum going into a hernia fascial defect at the  internal ring consistent with  an indirect inguinal hernia..  I gradually freed the peritoneal hernia sac off safely and reduced it into the preperitoneal space.  I freed the peritoneum off the spermatic vessels & vas deferens.  I freed peritoneum off the retroperitoneum along the psoas muscle.  Seen obvious right femoral hernia as well.  Spermatic cord lipoma was dissected away & removed.  I checked & assured hemostasis.    I turned attention on the opposite  LEFT pelvis.  I did dissection in a similar, mirror-image fashion. The patient had a direct space inguinal hernia.  With more subtle but definitely parents with insufflation and on examination.  Internal ring and femoral canal intact.  Obturator rings intact as well.  No other hernias.  I checked & assured hemostasis.     I chose 15x15 cm sheets  of ultra-lightweight polypropylene mesh (Ultrapro), one for each side.  I cut a single sigmoid-shaped slit ~6cm from a corner of each mesh.  I placed the meshes into the preperitoneal space & laid them as overlapping diamonds such that at the inferior points, a 6x6 cm corner flap rested in the true anterolateral pelvis, covering the obturator & femoral foramina.   I allowed the bladder to return to the pubis, this helping tuck the corners of the mesh in the anteriolateral pelvis.  The medial corners overlapped each other across midline cephalad to the pubic rim.   Given the numerous hernias of moderate size, I placed a third 15x15cm mesh in the center as a vertical diamond.  The lateral wings of the mesh overlap across the direct spaces and internal rings where the dominant hernias were.  This provided good coverage and reinforcement of the hernia repairs.  Because of the central mesh placement with good overlap, I did not place any tacks.   Brought the peritoneum back up and tacked it to the anterior abdominal wall, covering up all mesh.  Nothing exposed.  Freed the greater omentum off the epigastric of the ridge to help bring it down to help cover the bowel and protected against any potential new adhesions.  Hemostasis was good.  I evacuated carbon dioxide.  I closed the 10 mm port site  in left upper quadrant with 0 Vicryl sutureia with absorbable suture.  I closed the skin using 4-0 monocryl stitch.  Sterile dressings were applied.   The patient was extubated & arrived in the PACU in stable condition..  I had discussed postoperative care with the patient in the holding area.  Instructions are written in the chart.  I discussed operative findings, updated the patient's status, discussed probable steps to recovery, and gave postoperative recommendations to the patient's spouse.  Recommendations were made.  Questions were answered.  She expressed understanding & appreciation.   Adin Hector, M.D.,  F.A.C.S. Gastrointestinal and Minimally Invasive Surgery [facility] [address] [place], [place] [address] [phone] [address] / Paging  02/05/2019 10:00 AM

## 2019-02-05 NOTE — Anesthesia Procedure Notes (Signed)
Arterial Line Insertion Start/End8/10/2018 8:07 AM, 02/05/2019 8:10 AM Performed by: Lyn Hollingshead, MD, anesthesiologist  Preanesthetic checklist: patient identified, IV checked, site marked, risks and benefits discussed, surgical consent and monitors and equipment checked Patient sedated Right, radial was placed Catheter size: 20 G Hand hygiene performed  Allen's test indicative of satisfactory collateral circulation Attempts: 3 Procedure performed without using ultrasound guided technique. Following insertion, dressing applied and Biopatch. Post procedure assessment: normal  Patient tolerated the procedure well with no immediate complications.

## 2019-02-05 NOTE — H&P (Signed)
Jeffery Whitehead DOB: 1955/10/20 Married / Language: Jeffery Whitehead / Race: White Male  Patient Care Team: Jeffery Major, MD as PCP - General (Family Medicine) Jeffery Booze, MD as PCP - Cardiology (Cardiology) Jeffery Boston, MD as Consulting Physician (General Surgery) Jeffery Booze, MD as Consulting Physician (Cardiology) Jeffery Gobble, MD as Consulting Physician (Pulmonary Disease) Jeffery Craver, MD as Consulting Physician (Gastroenterology)    History of Present Illness Jeffery Shropshire PA C; 12/31/2018 5:17 PM) The patient is a 63 year old male who presents with an inguinal hernia.He is status post laparoscopic ventral hernia repair with mesh on 06/13/18 by Dr. Johney Whitehead. He had a normal postop course and has been doing well until about 2 days ago when he noticed a bulge to his right inguinal region. He states initially this was very painful but over the past 2 days the swelling and pain have decreased. He is not sure if the bulge is present today. He denies any redness over the area. Denies drainage. Denies fevers. He states he has started prednisone due to arthritis, given by his dermatologist.  History of aortic valve replacement in 1983 with a [place] valve, followed by Dr. Larae Whitehead, [place]. He has been on Coumadin since that time. Last echo was on 12/10/18 showed aortic valve narrowing. There is severe calcification of the aortic valve. Aortic valve regurg is moderate to severe. He was told to contact the cardiologist if he becomes symptomatic, but to otherwise monitor. He is planning to get a second opinion at [facility] at the end of July.   Allergies (Jeffery Whitehead, [place]; 12/31/2018 3:02 PM) No Known Drug Allergies  [10/02/2017]: Allergies Reconciled   Medication History (Jeffery Whitehead, RMA; 12/31/2018 3:02 PM) Gabapentin (300MG  Capsule, 1 (one) Oral two times daily, Taken starting 11/07/2018) Active. (May increase to 600 mg  three times a day) Warfarin Sodium (7.5MG  Tablet, Oral) Active. Rosuvastatin Calcium (5MG  Tablet, Oral) Active. Symbicort (160-4.5MCG/ACT Aerosol, Inhalation) Active. Zolpidem Tartrate (10MG  Tablet, Oral) Active. ValACYclovir HCl (1GM Tablet, Oral) Active. Klor-Con M20 [facility] Tablet ER, Oral) Active. Methocarbamol (750MG  Tablet, Oral) Active. Medications Reconciled    Review of Systems Jeffery Shropshire PA C; 12/31/2018 5:14 PM) General Present- Appetite Loss and Fatigue. Not Present- Chills, Fever, Night Sweats, Weight Gain and Weight Loss. Skin Present- Rash. Not Present- Change in Wart/Mole, Dryness, Hives, Jaundice, New Lesions, Non-Healing Wounds and Ulcer. HEENT Present- Hoarseness, Seasonal Allergies and Wears glasses/contact lenses. Not Present- Earache, Hearing Loss, Nose Bleed, Oral Ulcers, Ringing in the Ears, Sinus Pain, Sore Throat, Visual Disturbances and Yellow Eyes. Respiratory Present- Chronic Cough, Snoring and Wheezing. Not Present- Bloody sputum and Difficulty Breathing. Breast Not Present- Breast Mass, Breast Pain, Nipple Discharge and Skin Changes. Cardiovascular Present- Leg Cramps, Shortness of Breath and Swelling of Extremities. Not Present- Chest Pain, Difficulty Breathing Lying Down, Palpitations and Rapid Heart Rate. Gastrointestinal Present- Abdominal Pain. Not Present- Bloating, Bloody Stool, Change in Bowel Habits, Chronic diarrhea, Constipation, Difficulty Swallowing, Excessive gas, Gets full quickly at meals, Hemorrhoids, Indigestion, Nausea, Rectal Pain and Vomiting. Musculoskeletal Present- Back Pain, Joint Pain, Joint Stiffness, Muscle Pain, Muscle Weakness and Swelling of Extremities. Neurological Present- Weakness. Not Present- Decreased Memory, Fainting, Headaches, Numbness, Seizures, Tingling, Tremor and Trouble walking. Psychiatric Present- Depression. Not Present- Anxiety, Bipolar, Change in Sleep Pattern, Fearful and Frequent crying. Endocrine  Not Present- Cold Intolerance, Excessive Hunger, Hair Changes, Heat Intolerance and New Diabetes. Hematology Present- Blood Thinners. Not Present- Easy Bruising, Excessive  bleeding, Gland problems, HIV and Persistent Infections.  Vitals (Jeffery A. Brown RMA; 12/31/2018 3:02 PM) 12/31/2018 3:02 PM Weight: 194.8 lb Height: 72in Body Surface Area: 2.11 m Body Mass Index: 26.42 kg/m  Temp.: 98.8F  Pulse: 75 (Regular)  BP: 122/74(Sitting, Left Arm, Standard)   BP 126/69   Pulse 64   Temp 98.2 F (36.8 C) (Oral)   Resp 16   SpO2 98%      Physical Exam (Jeffery Shropshire PA C; 12/31/2018 5:15 PM) The physical exam findings are as follows: Note: GENERAL: Well-developed, well nourished male in no acute distress  EYES: No scleral icterus Pupils equal, lids normal  EXTERNAL EARS: Intact, no masses or lesions EXTERNAL NOSE: Intact, no masses or lesions MOUTH: Lips - no lesions Dentition - normal for age  RESPIRATORY: Normal effort, no use of accessory muscles  MUSCULOSKELETAL: Normal gait Grossly normal ROM upper extremities Grossly normal ROM lower extremities  SKIN: Warm and dry Not diaphoretic  PSYCHIATRIC: Normal judgement and insight Normal mood and affect Alert, oriented x 3  Male Genitourinary Note: When standing, there is a 3 cm x 3 cm bulge superior and medial to his inguinal crease. Easily reducible. Hernia felt with valsalva When supine, the mass is not present No sign of incarceration, no redness, not very tender     Assessment & Plan  INGUINAL HERNIA, RIGHT (K40.90) Impression: He is status post ventral/umbilical hernia repair with mesh in December 2019 by Dr. Johney Whitehead. On exam today, it appears that he has a right inguinal hernia. Dr. Johney Whitehead also examined the patient. He recommended that we get cardiac clearance before moving on with surgery. We reviewed the signs and symptoms of incarceration/strangulation. At this time,  the hernia is easily reducible and not causing many symptoms. He is aware that the hernia may increase in size over time. We will touch base his cardiologist and then contact the patient with steps moving forward. He is aware that he will need to hold his Coumadin before surgery.    Patient with history of recent ventral hernia repair with periumbilical mesh now with inguinal hernia on right side.  Some laxity in the left side.  Recommended laparoscopic exploration.  Most likely a Tapp approach.  Patient wishes to get second opinion on his chronic artificial heart valve at [facility] clinic given some calcifications noted.  Will wait for cardiac clearance and make sure it is safe to hold warfarin perioperatively need for Lovenox bridge per cardiology.  The anatomy & physiology of the abdominal wall and pelvic floor was discussed.  The pathophysiology of hernias in the inguinal and pelvic region was discussed.  Natural history risks such as progressive enlargement, pain, incarceration, and strangulation was discussed.   Contributors to complications such as smoking, obesity, diabetes, prior surgery, etc were discussed.    I feel the risks of no intervention will lead to serious problems that outweigh the operative risks; therefore, I recommended surgery to reduce and repair the hernia.  I explained laparoscopic techniques with possible need for an open approach.  I noted usual use of mesh to patch and/or buttress hernia repair  Risks such as bleeding, infection, abscess, need for further treatment, injury to other organs, need for repair of tissues / organs, stroke, heart attack, death, and other risks were discussed.  I noted a good likelihood this will help address the problem.   Goals of post-operative recovery were discussed as well.  Possibility that this will not correct all symptoms was explained.  I stressed the importance of low-impact activity, aggressive pain control, avoiding constipation, &  not pushing through pain to minimize risk of post-operative chronic pain or injury. Possibility of reherniation was discussed.  We will work to minimize complications.     An educational handout further explaining the pathology & treatment options was given as well.  Questions were answered.  The patient expresses understanding & wishes to proceed with surgery.  ADDENDUM: Discussed with patient's cardiology Dr Irish Lack, as well as cardiac surgery, Dr. Cyndia Bent.  Patient has moderate valve disease but is stable and functional.  Would benefit from cardiac valve replacement in a few months.  Patient complaining severe groin pain and wished to have hernia surgery done first.  Cardiology and cardiac surgery felt that was reasonable to do hernia surgery first and then focus on planning valve repair afterwards.  Adin Hector, MD, FACS, MASCRS Gastrointestinal and Minimally Invasive Surgery    [address] [place], [place] [address] [phone] Main / Paging [phone] Fax

## 2019-02-05 NOTE — Discharge Instructions (Signed)
HERNIA REPAIR: POST OP INSTRUCTIONS ° °###################################################################### ° °EAT °Gradually transition to a high fiber diet with a fiber supplement over the next few weeks after discharge.  Start with a pureed / full liquid diet (see below) ° °WALK °Walk an hour a day.  Control your pain to do that.   ° °CONTROL PAIN °Control pain so that you can walk, sleep, tolerate sneezing/coughing, and go up/down stairs. ° °HAVE A BOWEL MOVEMENT DAILY °Keep your bowels regular to avoid problems.  OK to try a laxative to override constipation.  OK to use an antidairrheal to slow down diarrhea.  Call if not better after 2 tries ° °CALL IF YOU HAVE PROBLEMS/CONCERNS °Call if you are still struggling despite following these instructions. °Call if you have concerns not answered by these instructions ° °###################################################################### ° ° ° °1. DIET: Follow a light bland diet & liquids the first 24 hours after arrival home, such as soup, liquids, starches, etc.  Be sure to drink plenty of fluids.  Quickly advance to a usual solid diet within a few days.  Avoid fast food or heavy meals as your are more likely to get nauseated or have irregular bowels.  A low-fat, high-fiber diet for the rest of your life is ideal.  ° °2. Take your usually prescribed home medications unless otherwise directed. ° °3. PAIN CONTROL: °a. Pain is best controlled by a usual combination of three different methods TOGETHER: °i. Ice/Heat °ii. Over the counter pain medication °iii. Prescription pain medication °b. Most patients will experience some swelling and bruising around the hernia(s) such as the bellybutton, groins, or old incisions.  Ice packs or heating pads (30-60 minutes up to 6 times a day) will help. Use ice for the first few days to help decrease swelling and bruising, then switch to heat to help relax tight/sore spots and speed recovery.  Some people prefer to use ice  alone, heat alone, alternating between ice & heat.  Experiment to what works for you.  Swelling and bruising can take several weeks to resolve.   °c. It is helpful to take an over-the-counter pain medication regularly for the first few weeks.  Choose one of the following that works best for you: °i. Naproxen (Aleve, etc)  Two 220mg tabs twice a day °ii. Ibuprofen (Advil, etc) Three 200mg tabs four times a day (every meal & bedtime) °iii. Acetaminophen (Tylenol, etc) 325-650mg four times a day (every meal & bedtime) °d. A  prescription for pain medication should be given to you upon discharge.  Take your pain medication as prescribed.  °i. If you are having problems/concerns with the prescription medicine (does not control pain, nausea, vomiting, rash, itching, etc), please call us [phone] to see if we need to switch you to a different pain medicine that will work better for you and/or control your side effect better. °ii. If you need a refill on your pain medication, please contact your pharmacy.  They will contact our office to request authorization. Prescriptions will not be filled after 5 pm or on week-ends. ° °4. Avoid getting constipated.  Between the surgery and the pain medications, it is common to experience some constipation.  Increasing fluid intake and taking a fiber supplement (such as Metamucil, Citrucel, FiberCon, MiraLax, etc) 1-2 times a day regularly will usually help prevent this problem from occurring.  A mild laxative (prune juice, Milk of Magnesia, MiraLax, etc) should be taken according to package directions if there are no bowel movements after 48   hours.   ° °5. Wash / shower every day.  You may shower over the dressings as they are waterproof.   ° °6. Remove your waterproof bandages, skin tapes, and other bandages 5 days after surgery. You may replace a dressing/Band-Aid to cover the incision for comfort if you wish. You may leave the incisions open to air.  You may replace a  dressing/Band-Aid to cover an incision for comfort if you wish.  Continue to shower over incision(s) after the dressing is off. ° °7. ACTIVITIES as tolerated:   °a. You may resume regular (light) daily activities beginning the next day--such as daily self-care, walking, climbing stairs--gradually increasing activities as tolerated.  Control your pain so that you can walk an hour a day.  If you can walk 30 minutes without difficulty, it is safe to try more intense activity such as jogging, treadmill, bicycling, low-impact aerobics, swimming, etc. °b. Save the most intensive and strenuous activity for last such as sit-ups, heavy lifting, contact sports, etc  Refrain from any heavy lifting or straining until you are off narcotics for pain control.   °c. DO NOT PUSH THROUGH PAIN.  Let pain be your guide: If it hurts to do something, don't do it.  Pain is your body warning you to avoid that activity for another week until the pain goes down. °d. You may drive when you are no longer taking prescription pain medication, you can comfortably wear a seatbelt, and you can safely maneuver your car and apply brakes. °e. You may have sexual intercourse when it is comfortable.  ° °8. FOLLOW UP in our office °a. Please call [facility] at [phone] to set up an appointment to see your surgeon in the office for a follow-up appointment approximately 2-3 weeks after your surgery. °b. Make sure that you call for this appointment the day you arrive home to insure a convenient appointment time. ° °9.  If you have disability of FMLA / Family leave forms, please bring the forms to the office for processing.  (do not give to your surgeon). ° °WHEN TO CALL US [phone]: °1. Poor pain control °2. Reactions / problems with new medications (rash/itching, nausea, etc)  °3. Fever over 101.5 F (38.5 C) °4. Inability to urinate °5. Nausea and/or vomiting °6. Worsening swelling or bruising °7. Continued bleeding from incision. °8. Increased pain,  redness, or drainage from the incision ° ° The clinic staff is available to answer your questions during regular business hours (8:30am-5pm).  Please don’t hesitate to call and ask to speak to one of our nurses for clinical concerns.  ° If you have a medical emergency, go to the nearest emergency room or call 911. ° A surgeon from [facility] is always on call at the hospitals in [employer] ° °[facility] °[address], [address], [place], [place]  [address] ? ° [address] [address], [place],    [address] °MAIN: [phone] ? TOLL FREE: [phone] ? FAX: [phone] °[email or web address] ° °

## 2019-02-06 ENCOUNTER — Encounter (HOSPITAL_COMMUNITY): Payer: Self-pay | Admitting: Surgery

## 2019-02-06 DIAGNOSIS — I10 Essential (primary) hypertension: Secondary | ICD-10-CM | POA: Diagnosis not present

## 2019-02-06 DIAGNOSIS — E119 Type 2 diabetes mellitus without complications: Secondary | ICD-10-CM | POA: Diagnosis not present

## 2019-02-06 DIAGNOSIS — D176 Benign lipomatous neoplasm of spermatic cord: Secondary | ICD-10-CM | POA: Diagnosis not present

## 2019-02-06 DIAGNOSIS — K4020 Bilateral inguinal hernia, without obstruction or gangrene, not specified as recurrent: Secondary | ICD-10-CM | POA: Diagnosis not present

## 2019-02-06 DIAGNOSIS — I2510 Atherosclerotic heart disease of native coronary artery without angina pectoris: Secondary | ICD-10-CM | POA: Diagnosis not present

## 2019-02-06 DIAGNOSIS — K4190 Unilateral femoral hernia, without obstruction or gangrene, not specified as recurrent: Secondary | ICD-10-CM | POA: Diagnosis not present

## 2019-02-06 NOTE — Discharge Summary (Signed)
Physician Discharge Summary    Patient ID: Jeffery Whitehead MRN: [id number] DOB/AGE: [birth year]  63 y.o.  Patient Care Team: Nickola Major, MD as PCP - General (Family Medicine) [name], MD as PCP - Cardiology (Cardiology) Michael Boston, MD as Consulting Physician (General Surgery) [name], MD as Consulting Physician (Cardiology) [name], MD as Consulting Physician (Pulmonary Disease) Juanita Craver, MD as Consulting Physician (Gastroenterology)  Admit date: 02/05/2019  Discharge date: 02/06/2019  Hospital Stay = 0 days    Discharge Diagnoses:  Principal Problem:   Bilateral inguinal herniae s/p TAPP lap repair w mesh 02/05/2019 Active Problems:   Prosthetic aortic valve stenosis   Anticoagulated   Obstructive sleep apnea syndrome   Hx of aortic valve replacement   Lumbar discitis   Aortic insufficiency   Diastasis recti   Femoral hernia of right side s/p TAPP lap repair w mesh 02/05/2019   Bilateral inguinal hernia   1 Day Post-Op  02/05/2019  POST-OPERATIVE DIAGNOSIS:   BILATERAL INGUINAL HERNIAS RIGHT FEMORAL HERNIA  PROCEDURE:   LAPAROSCOPIC BILATERAL INGUINAL HERNIA REPAIRS WITH MESH (TAPP APPROACH) LAPAROSCOPIC RIGHT FEMORAL HERNIA REPAIR TAP BLOCK - BILATERAL  SURGEON:  Adin Hector, MD  Consults: None  Hospital Course:   The patient underwent the surgery above.  Postoperatively, the patient gradually mobilized and advanced to a solid diet.  Pain and other symptoms were treated aggressively.   Because of his cardiac issues he was watched overnight.  No events.  Some bruising on his genitals expected but no severe hematoma.  By the time of discharge, the patient was walking well the hallways, eating food, having flatus.  Pain was well-controlled on an oral medications.  Based on meeting discharge criteria and continuing to recover, I felt it was safe for the patient to be discharged from the hospital to further recover with  close followup. Postoperative recommendations were discussed in detail.  They are written as well.  Discharged Condition: good  Discharge Exam: Blood pressure (!) 110/55, pulse (!) 55, temperature 98 F (36.7 C), temperature source Oral, resp. rate 18, SpO2 99 %.  General: Pt awake/alert/oriented x4 in No acute distress Eyes: PERRL, normal EOM.  Sclera clear.  No icterus Neuro: CN II-XII intact w/o focal sensory/motor deficits. Lymph: No head/neck/groin lymphadenopathy Psych:  No delerium/psychosis/paranoia HENT: Normocephalic, Mucus membranes moist.  No thrush Neck: Supple, No tracheal deviation Chest: No chest wall pain w good excursion CV:  Pulses intact.  Regular rhythm MS: Normal AROM mjr joints.  No obvious deformity Abdomen: Soft.  Nondistended.  Nontender.  No evidence of peritonitis.  No incarcerated hernias.  Moderate ecchymosis and mild swelling of the penis.  No scrotal hematoma.  No recurrent hernias.  Ext:  SCDs BLE.  No mjr edema.  No cyanosis Skin: No petechiae / purpura   Disposition:   Follow-up Information    Michael Boston, MD. Schedule an appointment as soon as possible for a visit in 3 weeks.   Specialty: General Surgery Why: To follow up after your operation Contact information: [place] [id number] [phone]           Discharge disposition: 01-Home or Self Care       Discharge Instructions    Call MD for:   Complete by: As directed    FEVER >101.5 F (Temperatures <101.96F occasionally happen and are not significant)   Call MD for:  extreme fatigue   Complete by: As directed  Call MD for:  persistant dizziness or light-headedness   Complete by: As directed    Call MD for:  persistant nausea and vomiting   Complete by: As directed    Call MD for:  redness, tenderness, or signs of infection (pain, swelling, redness, odor or green/yellow discharge around incision site)   Complete by: As directed    Call  MD for:  severe uncontrolled pain   Complete by: As directed    Diet - low sodium heart healthy   Complete by: As directed    Follow a light diet the first few days at home.  Start with a bland diet such as soups, liquids, starchy foods, low fat foods, etc.   If you feel full, bloated, or constipated, stay on a full liquid or pureed/blenderized diet for a few days until you feel better and no longer constipated. Gradually get back to a regular solid diet.  Avoid fast food or heavy meals the first week as you are more likely to get nauseated.   Discharge instructions   Complete by: As directed    One the day of your discharge from the hospital (or the next business weekday), please call [facility] to set up or confirm an appointment to see your surgeon in the office for a follow-up appointment.  Usually it is 2-3 weeks after your surgery.  Other concerns If you are not getting better after two weeks or are noticing you are getting worse, contact our office [phone] for further advice.  We may need to adjust your medications, re-evaluate you in the office, send you to the emergency room, or see what other things we can do to help. The clinic staff is available to answer your questions during regular business hours (8:30am-5pm).  Please don't hesitate to call and ask to speak to one of our nurses for clinical concerns.    A surgeon from [facility] is always on call at the hospitals 24 hours/day If you have a medical emergency, go to the nearest emergency room or call 911.   Driving Restrictions   Complete by: As directed    You may drive when you are no longer taking prescription pain medication, you can comfortably wear a seatbelt, and you can safely maneuver your car and apply brakes.   Increase activity slowly   Complete by: As directed    Increase activity slowly   Complete by: As directed    Lifting restrictions   Complete by: As directed    You may resume  regular (light) daily activities beginning the next day-such as daily self-care, walking, climbing stairs-gradually increasing activities as tolerated.   If you can walk 30 minutes without difficulty, it is safe to try more intense activity such as jogging, treadmill, bicycling, low-impact aerobics, swimming, etc. Save the most intensive and strenuous activity for last such as sit-ups, heavy lifting, contact sports, etc   Refrain from any heavy lifting or straining until you are off narcotics for pain control.   DO NOT PUSH THROUGH PAIN.   Let pain be your guide: If it hurts to do something, don't do it.   Pain is your body warning you to avoid that activity for another week until the pain goes down.   May shower / Bathe   Complete by: As directed    Wash / shower every day.  You may shower over the dressings as they are waterproof.  Continue to shower over incision(s) after the dressing  is off.   May walk up steps   Complete by: As directed    Remove dressing in 72 hours   Complete by: As directed    Remove your waterproof dressings, skin tapes, and other bandages 5 days after surgery.    You may leave the incision(s) open to air.   You may replace a dressing/Band-Aid to cover the incision for comfort if you wish.   Sexual Activity Restrictions   Complete by: As directed    You may have sexual intercourse when it is comfortable. If it hurts to do something, stop.      Allergies as of 02/06/2019   No Known Allergies     Medication List    TAKE these medications   albuterol 108 (90 Base) MCG/ACT inhaler Commonly known as: VENTOLIN HFA Inhale 2 puffs into the lungs every 6 (six) hours as needed for wheezing or shortness of breath.   amoxicillin 500 MG capsule Commonly known as: AMOXIL Take 2,000 mg by mouth See admin instructions. Take 2000 mg 1 hour prior to dental procedures   CENTRUM SILVER ADULT 50+ PO Take 1 tablet by mouth daily with breakfast.   cetirizine 10 MG tablet  Commonly known as: ZYRTEC Take 10 mg by mouth daily.   Dexilant 60 MG capsule Generic drug: dexlansoprazole Take 60 mg by mouth daily.   diclofenac sodium 1 % Gel Commonly known as: VOLTAREN Apply 4 g topically 4 (four) times daily as needed (back pain).   diphenhydramine-acetaminophen 25-500 MG Tabs tablet Commonly known as: TYLENOL PM Take 1 tablet by mouth at bedtime.   ferrous sulfate 324 (65 Fe) MG Tbec Take 324 mg by mouth daily.   folic acid 1 MG tablet Commonly known as: FOLVITE Take 1 mg by mouth daily.   furosemide 40 MG tablet Commonly known as: LASIX Take 20 mg by mouth daily.   gabapentin 300 MG capsule Commonly known as: NEURONTIN Take 1 capsule (300 mg total) by mouth 2 (two) times daily. Increase to 4x/day as needed   lidocaine 4 % cream Commonly known as: LMX Apply 1 application topically as needed (back pain).   Linzess 72 MCG capsule Generic drug: linaclotide Take 72 mcg by mouth daily.   METAMUCIL PO Take 1 Dose by mouth daily.   methotrexate 2.5 MG tablet Commonly known as: RHEUMATREX Take 15 mg by mouth every Tuesday.   omeprazole 20 MG capsule Commonly known as: PRILOSEC Take 20 mg by mouth daily.   potassium chloride SA 20 MEQ tablet Commonly known as: K-DUR Take 1 tablet (20 mEq total) by mouth daily as needed. TAKE ONLY if taking furosemide (lasix) What changed:   when to take this  additional instructions   predniSONE 5 MG (48) Tbpk tablet Commonly known as: STERAPRED UNI-PAK 48 TAB Take 5-15 mg by mouth See admin instructions. Taking 15 mg daily until 01/21/19, 10 mg daily until 01/28/19, 5 mg daily until l 02/04/19.   PreviDent 5000 Booster Plus 1.1 % Pste Generic drug: Sodium Fluoride Place 1 application onto teeth at bedtime.   rosuvastatin 5 MG tablet Commonly known as: CRESTOR Take 5 mg by mouth at bedtime.   Simethicone 250 MG Caps Take 250 mg by mouth daily.   traMADol 50 MG tablet Commonly known as: ULTRAM  Take 1-2 tablets (50-100 mg total) by mouth every 6 (six) hours as needed for moderate pain or severe pain.   TURMERIC PO Take 538 mg by mouth daily.   valsartan 160 MG  tablet Commonly known as: DIOVAN Take 1 tablet (160 mg total) by mouth daily.   warfarin 7.5 MG tablet Commonly known as: COUMADIN Take 3.75-7.5 mg by mouth See admin instructions. Take 7.5mg  at night on Sunday, Tuesday, Thursday, and Saturday. 3.75mg  at night on Monday, Wednesday, and Fridays   zolpidem 10 MG tablet Commonly known as: AMBIEN Take 10 mg by mouth at bedtime.       Significant Diagnostic Studies:  Results for orders placed or performed during the hospital encounter of 02/05/19 (from the past 72 hour(s))  Protime-INR     Status: None   Collection Time: 02/05/19  6:16 AM  Result Value Ref Range   Prothrombin Time 14.4 11.4 - 15.2 seconds   INR 1.1 0.8 - 1.2    Comment: (NOTE) INR goal varies based on device and disease states. Performed at [facility], [place] [address] [place], [place] [address]     No results found.  Past Medical History:  Diagnosis Date  . Acute encephalopathy    Likely acute metabolic encephalopathy secondary to sepsis  . Anemia   . Asthma   . Bloody nose    occ  . Cold   . Decreased anal sphincter tone   . Fatty liver 2018  . GERD (gastroesophageal reflux disease)   . Heart murmur   . Heart valve replaced    1983   [facility]  . History of aortic insufficiency   . History of aortic stenosis   . History of endocarditis 2017   prosthetic aortic valve endocarditis,   . History of shingles 2019  . Hypertension   . Insomnia   . Lumbar discitis   . Mixed hyperlipidemia   . MSSA bacteremia 2018   Sepsis  . Nasal septal deviation   . Obesity   . Pre-diabetes   . Seborrheic keratosis   . Shortness of breath dyspnea    Chronic  . Sleep apnea    cpap nightly   . Umbilical hernia   . Varicose veins     Past Surgical History:  Procedure  Laterality Date  . AORTIC VALVE REPLACEMENT  1983   [employer]  . BUBBLE STUDY  01/22/2019   Procedure: BUBBLE STUDY;  Surgeon: Elouise Munroe, MD;  Location: [place];  Service: Cardiology;;  . Kathryne Hitch GUIDANCE N/A 08/24/2016   Procedure: Fluoroscopy Guidance;  Surgeon: Leonie Man, MD;  Location: [place] [facility];  Service: Cardiovascular;  Laterality: N/A;  . INSERTION OF MESH N/A 06/13/2018   Procedure: INSERTION OF MESH;  Surgeon: Michael Boston, MD;  Location: [facility];  Service: General;  Laterality: N/A;  . Nasal cautery    . RIGHT HEART CATH AND CORONARY ANGIOGRAPHY N/A 01/22/2019   Procedure: RIGHT HEART CATH AND CORONARY ANGIOGRAPHY;  Surgeon: [name], MD;  Location: [place] [facility];  Service: Cardiovascular;  Laterality: N/A;  . TEE WITHOUT CARDIOVERSION N/A 08/24/2016   Procedure: TRANSESOPHAGEAL ECHOCARDIOGRAM (TEE);  Surgeon: Pixie Casino, MD;  Location: [facility];  Service: Cardiovascular;  Laterality: N/A;  . TEE WITHOUT CARDIOVERSION N/A 01/22/2019   Procedure: TRANSESOPHAGEAL ECHOCARDIOGRAM (TEE);  Surgeon: Elouise Munroe, MD;  Location: [place];  Service: Cardiology;  Laterality: N/A;  . TONSILLECTOMY    . VEIN LIGATION AND STRIPPING Left 08/15/2014   Procedure: SAPHENOUS VEIN LIGATION AND EXCISION OF VARICOSITIES;  Surgeon: Mal Misty, MD;  Location: [place];  Service: Vascular;  Laterality: Left;  Marland Kitchen VENTRAL HERNIA REPAIR N/A  06/13/2018   Procedure: LAPAROSCOPIC VENTRAL WALL HERNIA REPAIR ERAS PATHWAY;  Surgeon: Michael Boston, MD;  Location: [facility];  Service: General;  Laterality: N/A;    Social History   Socioeconomic History  . Marital status: Married    Spouse name: Not on file  . Number of children: 1  . Years of education: Not on file  . Highest education level: Not on file  Occupational History  . Occupation: Database administrator  Social Needs  . Financial resource strain: Not on file  . Food insecurity     Worry: Not on file    Inability: Not on file  . Transportation needs    Medical: Not on file    Non-medical: Not on file  Tobacco Use  . Smoking status: Never Smoker  . Smokeless tobacco: Never Used  Substance and Sexual Activity  . Alcohol use: Yes    Alcohol/week: 0.0 standard drinks    Comment: wine at night. 12 ounce can of beer ech day   . Drug use: No  . Sexual activity: Not Currently  Lifestyle  . Physical activity    Days per week: Not on file    Minutes per session: Not on file  . Stress: Not on file  Relationships  . Social Herbalist on phone: Not on file    Gets together: Not on file    Attends religious service: Not on file    Active member of club or organization: Not on file    Attends meetings of clubs or organizations: Not on file    Relationship status: Not on file  . Intimate partner violence    Fear of current or ex partner: Not on file    Emotionally abused: Not on file    Physically abused: Not on file    Forced sexual activity: Not on file  Other Topics Concern  . Not on file  Social History Narrative  . Not on file    Family History  Problem Relation Age of Onset  . Heart disease Father     Current Facility-Administered Medications  Medication Dose Route Frequency Provider Last Rate Last Dose  . 0.9 %  sodium chloride infusion  250 mL Intravenous PRN Michael Boston, MD      . acetaminophen (TYLENOL) tablet 1,000 mg  1,000 mg Oral Tor Netters, MD   1,000 mg at 02/06/19 0533  . albumin human 5 % solution 12.5 g  12.5 g Intravenous Q6H PRN Michael Boston, MD      . albuterol (PROVENTIL) (2.5 MG/3ML) 0.083% nebulizer solution 2.5 mg  2.5 mg Inhalation Q6H PRN Michael Boston, MD      . bisacodyl (DULCOLAX) suppository 10 mg  10 mg Rectal Daily PRN Michael Boston, MD      . diclofenac sodium (VOLTAREN) 1 % transdermal gel 4 g  4 g Topical QID PRN Michael Boston, MD      . diphenhydrAMINE (BENADRYL) 12.5 MG/5ML elixir 12.5 mg  12.5 mg  Oral Q6H PRN Michael Boston, MD       Or  . diphenhydrAMINE (BENADRYL) injection 12.5 mg  12.5 mg Intravenous Q6H PRN Michael Boston, MD      . diphenhydrAMINE (BENADRYL) capsule 25 mg  25 mg Oral Ardeen Fillers, MD      . enoxaparin (LOVENOX) injection 40 mg  40 mg Subcutaneous Q24H Michael Boston, MD      . ferrous sulfate tablet 325 mg  325 mg Oral QAC  breakfast Michael Boston, MD      . folic acid (FOLVITE) tablet 1 mg  1 mg Oral Daily Michael Boston, MD   1 mg at 02/05/19 1942  . furosemide (LASIX) tablet 20 mg  20 mg Oral Daily Michael Boston, MD   Stopped at 02/05/19 1944  . gabapentin (NEURONTIN) capsule 300 mg  300 mg Oral BID Michael Boston, MD   300 mg at 02/05/19 2128  . hydrALAZINE (APRESOLINE) injection 5-20 mg  5-20 mg Intravenous Q4H PRN Michael Boston, MD      . HYDROmorphone (DILAUDID) injection 0.5-2 mg  0.5-2 mg Intravenous Q2H PRN Michael Boston, MD      . lactated ringers infusion   Intravenous Continuous Michael Boston, MD 100 mL/hr at 02/06/19 0130    . linaclotide (LINZESS) capsule 72 mcg  72 mcg Oral Daily Michael Boston, MD   72 mcg at 02/05/19 1942  . lip balm (CARMEX) ointment 1 application  1 application Topical BID Michael Boston, MD   1 application at 69/67/89 1944  . loratadine (CLARITIN) tablet 10 mg  10 mg Oral Daily Michael Boston, MD      . magic mouthwash  15 mL Oral QID PRN Michael Boston, MD      . methocarbamol (ROBAXIN) tablet 750 mg  750 mg Oral Q6H PRN Michael Boston, MD      . Derrill Memo ON 02/12/2019] methotrexate (RHEUMATREX) tablet 15 mg  15 mg Oral Q Gearldine Bienenstock, MD      . metoprolol tartrate (LOPRESSOR) injection 5 mg  5 mg Intravenous Q6H PRN Michael Boston, MD      . ondansetron (ZOFRAN-ODT) disintegrating tablet 4 mg  4 mg Oral Q6H PRN Michael Boston, MD       Or  . ondansetron (ZOFRAN) injection 4 mg  4 mg Intravenous Q6H PRN Michael Boston, MD      . pantoprazole (PROTONIX) EC tablet 80 mg  80 mg Oral Daily Michael Boston, MD      . polyethylene glycol  (MIRALAX / GLYCOLAX) packet 17 g  17 g Oral Q12H PRN Michael Boston, MD      . prochlorperazine (COMPAZINE) tablet 10 mg  10 mg Oral Q6H PRN Michael Boston, MD       Or  . prochlorperazine (COMPAZINE) injection 5-10 mg  5-10 mg Intravenous Q6H PRN Michael Boston, MD      . psyllium (HYDROCIL/METAMUCIL) packet 1 packet  1 packet Oral BID Michael Boston, MD      . rosuvastatin (CRESTOR) tablet 5 mg  5 mg Oral Ardeen Fillers, MD   5 mg at 02/05/19 2128  . simethicone (MYLICON) chewable tablet 240 mg  240 mg Oral Daily Michael Boston, MD   240 mg at 02/05/19 1943  . simethicone (MYLICON) chewable tablet 40 mg  40 mg Oral Q6H PRN Michael Boston, MD      . sodium chloride flush (NS) 0.9 % injection 3 mL  3 mL Intravenous Gorden Harms, MD      . sodium chloride flush (NS) 0.9 % injection 3 mL  3 mL Intravenous PRN Michael Boston, MD      . traMADol Veatrice Bourbon) tablet 50-100 mg  50-100 mg Oral Q6H PRN Michael Boston, MD   100 mg at 02/06/19 0533  . zolpidem (AMBIEN) tablet 10 mg  10 mg Oral Ardeen Fillers, MD   10 mg at 02/05/19 2128     No Known Allergies  Signed: Caralyn Guile.  Aamilah Augenstein, MD, FACS, MASCRS Gastrointestinal and Minimally Invasive Surgery    [address], [place], [place] [address] [phone] Main / Paging [phone] Fax   02/06/2019, 8:07 AM

## 2019-02-06 NOTE — Plan of Care (Signed)
Patient lying in bed this morning; has already ambulated in hall this morning. Pain and nausea controlled. Anticipating discharge today. Will continue to monitor.

## 2019-02-06 NOTE — Plan of Care (Signed)
Reviewed discharge instructions with patient; copy given. IV removed. Patient ready for discharge.  

## 2019-02-12 NOTE — Progress Notes (Signed)
Cardiology Office Note    Date:  02/13/2019   ID:  Jeffery Whitehead, DOB 10-Dec-1955, MRN [id number]  PCP:  Nickola Major, MD  Cardiologist: Larae Grooms, MD EPS: None  Chief Complaint  Patient presents with  . Hospitalization Follow-up    History of Present Illness:  Jeffery Whitehead is a 63 y.o. male S/P St. Jude AVR 1983 at age 19 on coumadin, MSSA basteremia and endocarditis 08/2016 presumed secondary to lumbar discitis. Echo 12/2018 showed severe AS but asymptomatic. Cath 01/22/19 nonobstructive CAD, TEE severe AS. He was evaluated by Dr. Cyndia Bent 7/23/ 20 felt to be perivalvular leak secondary to endocarditis. It was agreed that he could have hernia repair prior to valve surgery to help him ambulate post op.  Patient underwent bilateral inguinal herniae repair 02/05/19. Recovering well. Walking 10,000 steps daily. Denies shortness or breath, chest pain. Dizziness or presyncope. Went to the [facility] since he saw Dr. Cyndia Bent for a second opinion. They agreed he needs his valve replaced and he's planning on having it done at the [facility].   Past Medical History:  Diagnosis Date  . Acute encephalopathy    Likely acute metabolic encephalopathy secondary to sepsis  . Anemia   . Asthma   . Bloody nose    occ  . Cold   . Decreased anal sphincter tone   . Fatty liver 2018  . GERD (gastroesophageal reflux disease)   . Heart murmur   . Heart valve replaced    1983   st jude  . History of aortic insufficiency   . History of aortic stenosis   . History of endocarditis 2017   prosthetic aortic valve endocarditis,   . History of shingles 2019  . Hypertension   . Insomnia   . Lumbar discitis   . Mixed hyperlipidemia   . MSSA bacteremia 2018   Sepsis  . Nasal septal deviation   . Obesity   . Pre-diabetes   . Seborrheic keratosis   . Shortness of breath dyspnea    Chronic  . Sleep apnea    cpap nightly   . Umbilical hernia   . Varicose veins     Past  Surgical History:  Procedure Laterality Date  . AORTIC VALVE REPLACEMENT  1983   St. Jude  . BUBBLE STUDY  01/22/2019   Procedure: BUBBLE STUDY;  Surgeon: Elouise Munroe, MD;  Location: [place];  Service: Cardiology;;  . Kathryne Hitch GUIDANCE N/A 08/24/2016   Procedure: Fluoroscopy Guidance;  Surgeon: Leonie Man, MD;  Location: [place] [facility];  Service: Cardiovascular;  Laterality: N/A;  . INGUINAL HERNIA REPAIR Bilateral 02/05/2019   Procedure: LAPAROSCOPIC BILATERAL INGUINAL HERNIA REPAIRS WITH MESH, RIGHT FEMORAL HERNIA REPAIR, TAPP APPROACH, TAP BLOCK BILATERALLY;  Surgeon: Michael Boston, MD;  Location: [facility];  Service: General;  Laterality: Bilateral;  . INSERTION OF MESH N/A 06/13/2018   Procedure: INSERTION OF MESH;  Surgeon: Michael Boston, MD;  Location: [facility];  Service: General;  Laterality: N/A;  . Nasal cautery    . RIGHT HEART CATH AND CORONARY ANGIOGRAPHY N/A 01/22/2019   Procedure: RIGHT HEART CATH AND CORONARY ANGIOGRAPHY;  Surgeon: [name], MD;  Location: [place] [facility];  Service: Cardiovascular;  Laterality: N/A;  . TEE WITHOUT CARDIOVERSION N/A 08/24/2016   Procedure: TRANSESOPHAGEAL ECHOCARDIOGRAM (TEE);  Surgeon: Pixie Casino, MD;  Location: [facility] ENDOSCOPY;  Service: Cardiovascular;  Laterality: N/A;  . TEE WITHOUT CARDIOVERSION N/A 01/22/2019   Procedure: TRANSESOPHAGEAL ECHOCARDIOGRAM (TEE);  Surgeon: Elouise Munroe, MD;  Location: [place];  Service: Cardiology;  Laterality: N/A;  . TONSILLECTOMY    . VEIN LIGATION AND STRIPPING Left 08/15/2014   Procedure: SAPHENOUS VEIN LIGATION AND EXCISION OF VARICOSITIES;  Surgeon: Mal Misty, MD;  Location: [place];  Service: Vascular;  Laterality: Left;  Marland Kitchen VENTRAL HERNIA REPAIR N/A 06/13/2018   Procedure: LAPAROSCOPIC VENTRAL WALL HERNIA REPAIR ERAS PATHWAY;  Surgeon: Michael Boston, MD;  Location: [facility];  Service: General;  Laterality: N/A;    Current Medications: Current Meds   Medication Sig  . albuterol (PROVENTIL HFA;VENTOLIN HFA) 108 (90 Base) MCG/ACT inhaler Inhale 2 puffs into the lungs every 6 (six) hours as needed for wheezing or shortness of breath.  Marland Kitchen amoxicillin (AMOXIL) 500 MG capsule Take 2,000 mg by mouth See admin instructions. Take 2000 mg 1 hour prior to dental procedures  . cetirizine (ZYRTEC) 10 MG tablet Take 10 mg by mouth daily.  Marland Kitchen DEXILANT 60 MG capsule Take 60 mg by mouth daily.  . diclofenac sodium (VOLTAREN) 1 % GEL Apply 4 g topically 4 (four) times daily as needed (back pain).   Marland Kitchen diphenhydramine-acetaminophen (TYLENOL PM) 25-500 MG TABS tablet Take 1 tablet by mouth at bedtime.  . ferrous sulfate 324 (65 Fe) MG TBEC Take 324 mg by mouth daily.   . folic acid (FOLVITE) 1 MG tablet Take 1 mg by mouth daily.  . furosemide (LASIX) 40 MG tablet Take 20 mg by mouth daily.  Marland Kitchen gabapentin (NEURONTIN) 300 MG capsule Take 1 capsule (300 mg total) by mouth 2 (two) times daily. Increase to 4x/day as needed  . lidocaine (LMX) 4 % cream Apply 1 application topically as needed (back pain).  Marland Kitchen LINZESS 72 MCG capsule Take 72 mcg by mouth daily.   . methotrexate (RHEUMATREX) 2.5 MG tablet Take 15 mg by mouth every Tuesday.  . Multiple Vitamins-Minerals (CENTRUM SILVER ADULT 50+ PO) Take 1 tablet by mouth daily with breakfast.  . omeprazole (PRILOSEC) 20 MG capsule Take 20 mg by mouth daily.   . potassium chloride SA (K-DUR,KLOR-CON) 20 MEQ tablet Take 1 tablet (20 mEq total) by mouth daily as needed. TAKE ONLY if taking furosemide (lasix) (Patient taking differently: Take 20 mEq by mouth daily. )  . PREVIDENT 5000 BOOSTER PLUS 1.1 % PSTE Place 1 application onto teeth at bedtime.   . Psyllium (METAMUCIL PO) Take 1 Dose by mouth daily.  . rosuvastatin (CRESTOR) 5 MG tablet Take 5 mg by mouth at bedtime.   . Simethicone 250 MG CAPS Take 250 mg by mouth daily.  . TURMERIC PO Take 538 mg by mouth daily.  . valsartan (DIOVAN) 160 MG tablet Take 1 tablet (160 mg  total) by mouth daily.  Marland Kitchen warfarin (COUMADIN) 7.5 MG tablet Take 3.75-7.5 mg by mouth See admin instructions. Take 7.5mg  at night on Sunday, Tuesday, Thursday, and Saturday. 3.75mg  at night on Monday, Wednesday, and Fridays  . zolpidem (AMBIEN) 10 MG tablet Take 10 mg by mouth at bedtime.      Allergies:   Patient has no known allergies.   Social History   Socioeconomic History  . Marital status: Married    Spouse name: Not on file  . Number of children: 1  . Years of education: Not on file  . Highest education level: Not on file  Occupational History  . Occupation: Database administrator  Social Needs  . Financial resource strain: Not on file  . Food insecurity  Worry: Not on file    Inability: Not on file  . Transportation needs    Medical: Not on file    Non-medical: Not on file  Tobacco Use  . Smoking status: Never Smoker  . Smokeless tobacco: Never Used  Substance and Sexual Activity  . Alcohol use: Yes    Alcohol/week: 0.0 standard drinks    Comment: wine at night. 12 ounce can of beer ech day   . Drug use: No  . Sexual activity: Not Currently  Lifestyle  . Physical activity    Days per week: Not on file    Minutes per session: Not on file  . Stress: Not on file  Relationships  . Social Herbalist on phone: Not on file    Gets together: Not on file    Attends religious service: Not on file    Active member of club or organization: Not on file    Attends meetings of clubs or organizations: Not on file    Relationship status: Not on file  Other Topics Concern  . Not on file  Social History Narrative  . Not on file     Family History:  The patient's family history includes Heart disease in his father.   ROS:   Please see the history of present illness.    ROS All other systems reviewed and are negative.   PHYSICAL EXAM:   VS:  BP (!) 98/46   Pulse 64   Ht 6' (1.829 m)   Wt 193 lb 12.8 oz (87.9 kg)   SpO2 98%   BMI 26.28 kg/m    Physical Exam  GEN: Well nourished, well developed, in no acute distress  Neck: no JVD, carotid bruits, or masses Cardiac:RRR; valvular clicks heard 2/6 systolic murmur  Respiratory:  clear to auscultation bilaterally, normal work of breathing GI: soft, nontender, nondistended, + BS Ext: cath site without hematoma or hemorrhage. Good radial and brachial pulses. Lower extremities without cyanosis, clubbing, or edema, Good distal pulses bilaterally Neuro:  Alert and Oriented x 3 Psych: euthymic mood, full affect  Wt Readings from Last 3 Encounters:  02/13/19 193 lb 12.8 oz (87.9 kg)  02/04/19 192 lb 4 oz (87.2 kg)  01/24/19 191 lb 6.4 oz (86.8 kg)      Studies/Labs Reviewed:   EKG:  EKG is not ordered today.    Recent Labs: 02/04/2019: BUN 35; Creatinine, Ser 0.70; Hemoglobin 13.4; Platelets 243; Potassium 4.5; Sodium 139   Lipid Panel    Component Value Date/Time   CHOL 110 06/23/2017 0904   TRIG 105 06/23/2017 0904   HDL 39 (L) 06/23/2017 0904   CHOLHDL 2.8 06/23/2017 0904   CHOLHDL 3.7 03/08/2016 0837   VLDL 31 (H) 03/08/2016 0837   LDLCALC 50 06/23/2017 0904    Additional studies/ records that were reviewed today include:    Cardiac Cath 01/22/19  Mid LAD lesion is 25% stenosed.  Dist Cx lesion is 25% stenosed.  Prox RCA lesion is 25% stenosed.  Ao sat: 99%, PA sat 77%, CO 6.8 L/min; CI 3.2, mean PA 12 mm Hg; mean PCWP 7 mm Hg.   Nonobstructive CAD.   TEE 01/22/19 Severe prosthetic AS. Likely severe, very eccentric AR.  Communication with sinus of Valsalva with likely diastolic and systolic flow into sinus of Valsalva aneursym    ASSESSMENT:    1. Aortic valve stenosis, etiology of cardiac valve disease unspecified   2. Benign essential hypertension  PLAN:  In order of problems listed above:  Severe AS S/P St. Jude AVR   1983 at age 9 on coumadin, MSSA bacteremia and endocarditis 08/2016 presumed secondary to lumbar discitis. Now with severe AS and  will require surgery by Dr. Cyndia Bent. Nonobstructive CAD at cath.Just underwent hernia repair and walking well now. He has chosen to go to the [place] clinic to have his valve repaired.He will call for f/u when he returns.  Essential HTN BP on low side on diovan no changes   Medication Adjustments/Labs and Tests Ordered: Current medicines are reviewed at length with the patient today.  Concerns regarding medicines are outlined above.  Medication changes, Labs and Tests ordered today are listed in the Patient Instructions below. Patient Instructions  Medication Instructions:  Your physician recommends that you continue on your current medications as directed. Please refer to the Current Medication list given to you today.  If you need a refill on your cardiac medications before your next appointment, please call your pharmacy.   Lab work: None Ordered  If you have labs (blood work) drawn today and your tests are completely normal, you will receive your results only by: Marland Kitchen MyChart Message (if you have MyChart) OR . A paper copy in the mail If you have any lab test that is abnormal or we need to change your treatment, we will call you to review the results.  Testing/Procedures: None ordered  Follow-Up: Call after you have your surgery done with the [facility] to arrange a follow up appointment with Dr. Irish Lack  Any Other Special Instructions Will Be Listed Below (If Applicable).       Sumner Boast, PA-C  02/13/2019 11:41 AM    [facility] [address], [place], [place]  [address] Phone: [phone]; Fax: [phone]

## 2019-02-13 ENCOUNTER — Ambulatory Visit (INDEPENDENT_AMBULATORY_CARE_PROVIDER_SITE_OTHER): Payer: Medicare Other | Admitting: Physician Assistant

## 2019-02-13 ENCOUNTER — Other Ambulatory Visit: Payer: Self-pay

## 2019-02-13 ENCOUNTER — Encounter: Payer: Self-pay | Admitting: Physician Assistant

## 2019-02-13 VITALS — BP 98/46 | HR 64 | Ht 72.0 in | Wt 193.8 lb

## 2019-02-13 DIAGNOSIS — I350 Nonrheumatic aortic (valve) stenosis: Secondary | ICD-10-CM | POA: Diagnosis not present

## 2019-02-13 DIAGNOSIS — I10 Essential (primary) hypertension: Secondary | ICD-10-CM

## 2019-02-13 NOTE — Patient Instructions (Signed)
Medication Instructions:  Your physician recommends that you continue on your current medications as directed. Please refer to the Current Medication list given to you today.  If you need a refill on your cardiac medications before your next appointment, please call your pharmacy.   Lab work: None Ordered  If you have labs (blood work) drawn today and your tests are completely normal, you will receive your results only by: Marland Kitchen MyChart Message (if you have MyChart) OR . A paper copy in the mail If you have any lab test that is abnormal or we need to change your treatment, we will call you to review the results.  Testing/Procedures: None ordered  Follow-Up: Call after you have your surgery done with the [facility] to arrange a follow up appointment with Dr. Irish Lack  Any Other Special Instructions Will Be Listed Below (If Applicable).

## 2019-02-14 DIAGNOSIS — Z7901 Long term (current) use of anticoagulants: Secondary | ICD-10-CM | POA: Diagnosis not present

## 2019-02-14 DIAGNOSIS — Z952 Presence of prosthetic heart valve: Secondary | ICD-10-CM | POA: Diagnosis not present

## 2019-02-21 ENCOUNTER — Telehealth: Payer: Self-pay | Admitting: Emergency Medicine

## 2019-02-21 MED ORDER — ALBUTEROL SULFATE HFA 108 (90 BASE) MCG/ACT IN AERS: 2.0000 | INHALATION_SPRAY | Freq: Four times a day (QID) | RESPIRATORY_TRACT | 5 refills | Status: AC | PRN

## 2019-02-21 NOTE — Telephone Encounter (Signed)
Pt last seen 01/01/2019 by RB with recommendations to continue albuterol rescue inhaler as needed. Albuterol last filled 08/15/2017 x5 refills.   I called and spoke w/ pt to let him know we are sending an order for refill on his albuterol inhaler to [facility] as requested. Pt verbalized understanding with no additional questions. Order has been placed. Nothing further needed at this time.

## 2019-03-01 DIAGNOSIS — K219 Gastro-esophageal reflux disease without esophagitis: Secondary | ICD-10-CM | POA: Diagnosis present

## 2019-03-01 DIAGNOSIS — M0600 Rheumatoid arthritis without rheumatoid factor, unspecified site: Secondary | ICD-10-CM | POA: Diagnosis not present

## 2019-03-01 DIAGNOSIS — I6523 Occlusion and stenosis of bilateral carotid arteries: Secondary | ICD-10-CM | POA: Diagnosis not present

## 2019-03-01 DIAGNOSIS — Z7901 Long term (current) use of anticoagulants: Secondary | ICD-10-CM | POA: Diagnosis not present

## 2019-03-01 DIAGNOSIS — Z5181 Encounter for therapeutic drug level monitoring: Secondary | ICD-10-CM | POA: Diagnosis not present

## 2019-03-01 DIAGNOSIS — T826XXD Infection and inflammatory reaction due to cardiac valve prosthesis, subsequent encounter: Secondary | ICD-10-CM | POA: Diagnosis not present

## 2019-03-01 DIAGNOSIS — I480 Paroxysmal atrial fibrillation: Secondary | ICD-10-CM | POA: Diagnosis not present

## 2019-03-01 DIAGNOSIS — I361 Nonrheumatic tricuspid (valve) insufficiency: Secondary | ICD-10-CM | POA: Diagnosis not present

## 2019-03-01 DIAGNOSIS — Z79899 Other long term (current) drug therapy: Secondary | ICD-10-CM | POA: Diagnosis not present

## 2019-03-01 DIAGNOSIS — T82897A Other specified complication of cardiac prosthetic devices, implants and grafts, initial encounter: Secondary | ICD-10-CM | POA: Diagnosis not present

## 2019-03-01 DIAGNOSIS — M0609 Rheumatoid arthritis without rheumatoid factor, multiple sites: Secondary | ICD-10-CM | POA: Diagnosis not present

## 2019-03-01 DIAGNOSIS — R9431 Abnormal electrocardiogram [ECG] [EKG]: Secondary | ICD-10-CM | POA: Diagnosis not present

## 2019-03-01 DIAGNOSIS — I459 Conduction disorder, unspecified: Secondary | ICD-10-CM | POA: Diagnosis not present

## 2019-03-01 DIAGNOSIS — H9190 Unspecified hearing loss, unspecified ear: Secondary | ICD-10-CM | POA: Diagnosis not present

## 2019-03-01 DIAGNOSIS — I110 Hypertensive heart disease with heart failure: Secondary | ICD-10-CM | POA: Diagnosis present

## 2019-03-01 DIAGNOSIS — I350 Nonrheumatic aortic (valve) stenosis: Secondary | ICD-10-CM | POA: Diagnosis not present

## 2019-03-01 DIAGNOSIS — R0602 Shortness of breath: Secondary | ICD-10-CM | POA: Diagnosis not present

## 2019-03-01 DIAGNOSIS — I330 Acute and subacute infective endocarditis: Secondary | ICD-10-CM | POA: Diagnosis not present

## 2019-03-01 DIAGNOSIS — D689 Coagulation defect, unspecified: Secondary | ICD-10-CM | POA: Diagnosis not present

## 2019-03-01 DIAGNOSIS — E782 Mixed hyperlipidemia: Secondary | ICD-10-CM | POA: Diagnosis not present

## 2019-03-01 DIAGNOSIS — I5032 Chronic diastolic (congestive) heart failure: Secondary | ICD-10-CM | POA: Diagnosis not present

## 2019-03-01 DIAGNOSIS — I77810 Thoracic aortic ectasia: Secondary | ICD-10-CM | POA: Diagnosis not present

## 2019-03-01 DIAGNOSIS — I509 Heart failure, unspecified: Secondary | ICD-10-CM | POA: Diagnosis not present

## 2019-03-01 DIAGNOSIS — J45909 Unspecified asthma, uncomplicated: Secondary | ICD-10-CM | POA: Diagnosis not present

## 2019-03-01 DIAGNOSIS — K5900 Constipation, unspecified: Secondary | ICD-10-CM | POA: Diagnosis not present

## 2019-03-01 DIAGNOSIS — T82897D Other specified complication of cardiac prosthetic devices, implants and grafts, subsequent encounter: Secondary | ICD-10-CM | POA: Diagnosis not present

## 2019-03-01 DIAGNOSIS — Z9889 Other specified postprocedural states: Secondary | ICD-10-CM | POA: Diagnosis not present

## 2019-03-01 DIAGNOSIS — I38 Endocarditis, valve unspecified: Secondary | ICD-10-CM | POA: Diagnosis not present

## 2019-03-01 DIAGNOSIS — Z952 Presence of prosthetic heart valve: Secondary | ICD-10-CM | POA: Diagnosis not present

## 2019-03-01 DIAGNOSIS — I97190 Other postprocedural cardiac functional disturbances following cardiac surgery: Secondary | ICD-10-CM | POA: Diagnosis not present

## 2019-03-01 DIAGNOSIS — Z7952 Long term (current) use of systemic steroids: Secondary | ICD-10-CM | POA: Diagnosis not present

## 2019-03-01 DIAGNOSIS — R739 Hyperglycemia, unspecified: Secondary | ICD-10-CM | POA: Diagnosis not present

## 2019-03-01 DIAGNOSIS — G4733 Obstructive sleep apnea (adult) (pediatric): Secondary | ICD-10-CM | POA: Diagnosis not present

## 2019-03-01 DIAGNOSIS — T82857A Stenosis of cardiac prosthetic devices, implants and grafts, initial encounter: Secondary | ICD-10-CM | POA: Diagnosis not present

## 2019-03-01 DIAGNOSIS — E875 Hyperkalemia: Secondary | ICD-10-CM | POA: Diagnosis not present

## 2019-03-01 DIAGNOSIS — Z8619 Personal history of other infectious and parasitic diseases: Secondary | ICD-10-CM | POA: Diagnosis not present

## 2019-03-01 DIAGNOSIS — T8203XA Leakage of heart valve prosthesis, initial encounter: Secondary | ICD-10-CM | POA: Diagnosis not present

## 2019-03-01 DIAGNOSIS — R0609 Other forms of dyspnea: Secondary | ICD-10-CM | POA: Diagnosis not present

## 2019-03-01 DIAGNOSIS — I4892 Unspecified atrial flutter: Secondary | ICD-10-CM | POA: Diagnosis not present

## 2019-03-01 DIAGNOSIS — J939 Pneumothorax, unspecified: Secondary | ICD-10-CM | POA: Diagnosis not present

## 2019-03-01 DIAGNOSIS — E785 Hyperlipidemia, unspecified: Secondary | ICD-10-CM | POA: Diagnosis present

## 2019-03-01 DIAGNOSIS — Z9911 Dependence on respirator [ventilator] status: Secondary | ICD-10-CM | POA: Diagnosis not present

## 2019-03-01 DIAGNOSIS — I729 Aneurysm of unspecified site: Secondary | ICD-10-CM | POA: Diagnosis not present

## 2019-03-01 DIAGNOSIS — I719 Aortic aneurysm of unspecified site, without rupture: Secondary | ICD-10-CM | POA: Diagnosis not present

## 2019-03-01 DIAGNOSIS — I359 Nonrheumatic aortic valve disorder, unspecified: Secondary | ICD-10-CM | POA: Diagnosis not present

## 2019-03-01 DIAGNOSIS — Z20828 Contact with and (suspected) exposure to other viral communicable diseases: Secondary | ICD-10-CM | POA: Diagnosis present

## 2019-03-01 DIAGNOSIS — T82857D Stenosis of cardiac prosthetic devices, implants and grafts, subsequent encounter: Secondary | ICD-10-CM | POA: Diagnosis not present

## 2019-03-01 DIAGNOSIS — I10 Essential (primary) hypertension: Secondary | ICD-10-CM | POA: Diagnosis not present

## 2019-03-01 DIAGNOSIS — J9811 Atelectasis: Secondary | ICD-10-CM | POA: Diagnosis not present

## 2019-03-01 DIAGNOSIS — G8918 Other acute postprocedural pain: Secondary | ICD-10-CM | POA: Diagnosis not present

## 2019-03-01 DIAGNOSIS — I712 Thoracic aortic aneurysm, without rupture: Secondary | ICD-10-CM | POA: Diagnosis not present

## 2019-03-01 DIAGNOSIS — I2510 Atherosclerotic heart disease of native coronary artery without angina pectoris: Secondary | ICD-10-CM | POA: Diagnosis present

## 2019-03-01 DIAGNOSIS — J069 Acute upper respiratory infection, unspecified: Secondary | ICD-10-CM | POA: Diagnosis not present

## 2019-03-01 DIAGNOSIS — I351 Nonrheumatic aortic (valve) insufficiency: Secondary | ICD-10-CM | POA: Diagnosis not present

## 2019-03-01 DIAGNOSIS — Z01818 Encounter for other preprocedural examination: Secondary | ICD-10-CM | POA: Diagnosis not present

## 2019-03-01 DIAGNOSIS — Z01810 Encounter for preprocedural cardiovascular examination: Secondary | ICD-10-CM | POA: Diagnosis not present

## 2019-03-01 DIAGNOSIS — I517 Cardiomegaly: Secondary | ICD-10-CM | POA: Diagnosis not present

## 2019-03-01 DIAGNOSIS — B9561 Methicillin susceptible Staphylococcus aureus infection as the cause of diseases classified elsewhere: Secondary | ICD-10-CM | POA: Diagnosis not present

## 2019-03-01 DIAGNOSIS — I97110 Postprocedural cardiac insufficiency following cardiac surgery: Secondary | ICD-10-CM | POA: Diagnosis not present

## 2019-03-01 DIAGNOSIS — I340 Nonrheumatic mitral (valve) insufficiency: Secondary | ICD-10-CM | POA: Diagnosis not present

## 2019-03-07 ENCOUNTER — Telehealth: Payer: Self-pay

## 2019-03-07 NOTE — Telephone Encounter (Signed)
Received voicemail message in triage. Returned Engineer, drilling.  Patient questioning what antibiotics he as on when he was treated in 2018. Called patient and left voicemail to return call.  Eugenia Mcalpine

## 2019-03-08 DIAGNOSIS — R9431 Abnormal electrocardiogram [ECG] [EKG]: Secondary | ICD-10-CM | POA: Diagnosis not present

## 2019-03-10 DIAGNOSIS — R9431 Abnormal electrocardiogram [ECG] [EKG]: Secondary | ICD-10-CM | POA: Diagnosis not present

## 2019-03-10 DIAGNOSIS — I459 Conduction disorder, unspecified: Secondary | ICD-10-CM | POA: Diagnosis not present

## 2019-03-18 DIAGNOSIS — R079 Chest pain, unspecified: Secondary | ICD-10-CM | POA: Diagnosis not present

## 2019-03-18 DIAGNOSIS — J9811 Atelectasis: Secondary | ICD-10-CM | POA: Diagnosis not present

## 2019-03-18 DIAGNOSIS — T82857A Stenosis of cardiac prosthetic devices, implants and grafts, initial encounter: Secondary | ICD-10-CM | POA: Diagnosis not present

## 2019-03-18 DIAGNOSIS — I330 Acute and subacute infective endocarditis: Secondary | ICD-10-CM | POA: Diagnosis not present

## 2019-03-18 DIAGNOSIS — Z952 Presence of prosthetic heart valve: Secondary | ICD-10-CM | POA: Diagnosis not present

## 2019-03-18 DIAGNOSIS — R0602 Shortness of breath: Secondary | ICD-10-CM | POA: Diagnosis not present

## 2019-03-18 DIAGNOSIS — Z09 Encounter for follow-up examination after completed treatment for conditions other than malignant neoplasm: Secondary | ICD-10-CM | POA: Diagnosis not present

## 2019-03-18 DIAGNOSIS — I480 Paroxysmal atrial fibrillation: Secondary | ICD-10-CM | POA: Diagnosis not present

## 2019-03-18 DIAGNOSIS — I729 Aneurysm of unspecified site: Secondary | ICD-10-CM | POA: Diagnosis not present

## 2019-03-18 DIAGNOSIS — I38 Endocarditis, valve unspecified: Secondary | ICD-10-CM | POA: Diagnosis not present

## 2019-03-18 DIAGNOSIS — I10 Essential (primary) hypertension: Secondary | ICD-10-CM | POA: Diagnosis not present

## 2019-03-18 DIAGNOSIS — R9431 Abnormal electrocardiogram [ECG] [EKG]: Secondary | ICD-10-CM | POA: Diagnosis not present

## 2019-03-18 DIAGNOSIS — T826XXD Infection and inflammatory reaction due to cardiac valve prosthesis, subsequent encounter: Secondary | ICD-10-CM | POA: Diagnosis not present

## 2019-03-18 DIAGNOSIS — E782 Mixed hyperlipidemia: Secondary | ICD-10-CM | POA: Diagnosis not present

## 2019-03-18 DIAGNOSIS — I350 Nonrheumatic aortic (valve) stenosis: Secondary | ICD-10-CM | POA: Diagnosis not present

## 2019-03-18 DIAGNOSIS — Z01818 Encounter for other preprocedural examination: Secondary | ICD-10-CM | POA: Diagnosis not present

## 2019-03-18 DIAGNOSIS — I719 Aortic aneurysm of unspecified site, without rupture: Secondary | ICD-10-CM | POA: Diagnosis not present

## 2019-03-18 DIAGNOSIS — I351 Nonrheumatic aortic (valve) insufficiency: Secondary | ICD-10-CM | POA: Diagnosis not present

## 2019-03-18 DIAGNOSIS — Z7901 Long term (current) use of anticoagulants: Secondary | ICD-10-CM | POA: Diagnosis not present

## 2019-03-18 DIAGNOSIS — R001 Bradycardia, unspecified: Secondary | ICD-10-CM | POA: Diagnosis not present

## 2019-03-18 DIAGNOSIS — I440 Atrioventricular block, first degree: Secondary | ICD-10-CM | POA: Diagnosis not present

## 2019-03-18 DIAGNOSIS — T82897A Other specified complication of cardiac prosthetic devices, implants and grafts, initial encounter: Secondary | ICD-10-CM | POA: Diagnosis not present

## 2019-03-19 ENCOUNTER — Telehealth: Payer: Self-pay | Admitting: Interventional Cardiology

## 2019-03-19 NOTE — Telephone Encounter (Signed)
New Message   Patient is calling because he went to the [facility] to see if he can have an EKG done. Patient went to them to have the aortic heart valve replaced and they wanted him to have EKG a few weeks after. Please call to discuss.

## 2019-03-19 NOTE — Telephone Encounter (Signed)
Called and spoke to patient. He just had Redo AVR + Root on 9/3 at the [facility]. He states that they want him to have an EKG next week. Nurse visit arranged for 9/23 at 2:00 PM.

## 2019-03-21 DIAGNOSIS — Z952 Presence of prosthetic heart valve: Secondary | ICD-10-CM | POA: Diagnosis not present

## 2019-03-21 DIAGNOSIS — Z7901 Long term (current) use of anticoagulants: Secondary | ICD-10-CM | POA: Diagnosis not present

## 2019-03-25 DIAGNOSIS — Z952 Presence of prosthetic heart valve: Secondary | ICD-10-CM | POA: Diagnosis not present

## 2019-03-25 DIAGNOSIS — T826XXA Infection and inflammatory reaction due to cardiac valve prosthesis, initial encounter: Secondary | ICD-10-CM | POA: Diagnosis not present

## 2019-03-25 DIAGNOSIS — R933 Abnormal findings on diagnostic imaging of other parts of digestive tract: Secondary | ICD-10-CM | POA: Diagnosis not present

## 2019-03-27 ENCOUNTER — Ambulatory Visit (INDEPENDENT_AMBULATORY_CARE_PROVIDER_SITE_OTHER): Payer: Medicare Other

## 2019-03-27 ENCOUNTER — Other Ambulatory Visit: Payer: Self-pay

## 2019-03-27 VITALS — BP 138/78 | HR 54 | Ht 72.0 in | Wt 185.0 lb

## 2019-03-27 DIAGNOSIS — E782 Mixed hyperlipidemia: Secondary | ICD-10-CM

## 2019-03-27 DIAGNOSIS — I4891 Unspecified atrial fibrillation: Secondary | ICD-10-CM

## 2019-03-27 DIAGNOSIS — I350 Nonrheumatic aortic (valve) stenosis: Secondary | ICD-10-CM | POA: Diagnosis not present

## 2019-03-27 DIAGNOSIS — Z952 Presence of prosthetic heart valve: Secondary | ICD-10-CM

## 2019-03-27 DIAGNOSIS — I10 Essential (primary) hypertension: Secondary | ICD-10-CM

## 2019-03-27 MED ORDER — AMIODARONE HCL 200 MG PO TABS: 200.0000 mg | ORAL_TABLET | Freq: Every day | ORAL | 3 refills | Status: DC

## 2019-03-27 NOTE — Progress Notes (Signed)
1.) Reason for visit: EKG  2.) Name of MD requesting visit: Dr. Irish Lack  3.) H&P: patient s/p Redo AVR + Root on 9/3 at [facility] w/ post-op Afib  4.) Assessment and plan per MD: Patient completely asymptomatic other than some tenderness at his surgical incision. EKG showed Sinus Bradycardia 53 bpm. Patient was started on amiodarone 200 mg BID for post-op Afib and is taking lopressor 25 mg BID if HR >60 bpm. Patient's lasix was also d/c'd 5 days ago. Discussed with Dr. Irish Lack, and patient will decrease Amiodarone to 200 mg QD and continue lopressor 25 mg BID if HR >60 bpm. Patient will stop potassium and have the home health nurse that comes out to his house every week draw a BMET next week to reassess potassium. Patient was suppose to have an echo, EKG, and labs done on 10/23 at [facility], but patient wants to have them done here. Dr. Irish Lack is okay with doing them here at the end of October. Patient is going to reach out to [facility] to let us know which labs they wanted drawn and will send a Mychart message to let me know. Patient aware that I will order his echo and arrange for echo, lab, and EKG appt at that time. We will also arrange for a virtual f/u appt with Dr. Irish Lack after that.

## 2019-03-27 NOTE — Patient Instructions (Addendum)
Medication Instructions:  Your physician has recommended you make the following change in your medication:   1. STOP: potassium  2. DECREASE: amiodarone to 200 mg once a day  3. CONTINUE: metoprolol tartrate (lopressor) 25 mg twice a day  Lab work: Please have your home health nurse draw a basic metabolic panel next week when she comes to assess potassium level  If you have labs (blood work) drawn today and your tests are completely normal, you will receive your results only by: Marland Kitchen MyChart Message (if you have MyChart) OR . A paper copy in the mail If you have any lab test that is abnormal or we need to change your treatment, we will call you to review the results.  Testing/Procedures: Your physician has requested that you have an echocardiogram, EKG, and labs (let me know which ones [facility] would like for you to have done) at the end of October (I will call you to arrange appointments).   Follow-Up: . Follow up virtually with Dr. Irish Lack after your tests (I will call you to arrange)  Any Other Special Instructions Will Be Listed Below (If Applicable).

## 2019-03-28 DIAGNOSIS — Z7901 Long term (current) use of anticoagulants: Secondary | ICD-10-CM | POA: Diagnosis not present

## 2019-03-28 DIAGNOSIS — Z952 Presence of prosthetic heart valve: Secondary | ICD-10-CM | POA: Diagnosis not present

## 2019-03-28 DIAGNOSIS — Z23 Encounter for immunization: Secondary | ICD-10-CM | POA: Diagnosis not present

## 2019-03-28 DIAGNOSIS — Z5181 Encounter for therapeutic drug level monitoring: Secondary | ICD-10-CM | POA: Diagnosis not present

## 2019-04-01 DIAGNOSIS — E782 Mixed hyperlipidemia: Secondary | ICD-10-CM | POA: Diagnosis not present

## 2019-04-01 DIAGNOSIS — Z952 Presence of prosthetic heart valve: Secondary | ICD-10-CM | POA: Diagnosis not present

## 2019-04-01 DIAGNOSIS — I10 Essential (primary) hypertension: Secondary | ICD-10-CM | POA: Diagnosis not present

## 2019-04-05 NOTE — Progress Notes (Signed)
Scheduled the patient for the following appointments:    04/26/19 at 8:15 AM- Labs (BMP, CBC, Fasting LIPIDS) 04/26/19 at 8:35 AM- Echocardiogram 04/26/19 at 9:30 AM- Nurse Visit- EKG 04/29/19 at 10:00 AM- VIDEO Visit with Dr. Irish Lack

## 2019-04-05 NOTE — Addendum Note (Signed)
Addended by: Drue Novel I on: 04/05/2019 03:40 PM   Modules accepted: Orders

## 2019-04-08 DIAGNOSIS — T826XXA Infection and inflammatory reaction due to cardiac valve prosthesis, initial encounter: Secondary | ICD-10-CM | POA: Diagnosis not present

## 2019-04-08 DIAGNOSIS — Z952 Presence of prosthetic heart valve: Secondary | ICD-10-CM | POA: Diagnosis not present

## 2019-04-08 NOTE — Telephone Encounter (Signed)
Will route the pts BP and HR measurements to both Dr. Irish Lack and [place] RN for further review, recommendation, and follow-up with the pt thereafter, upon return to the office.

## 2019-04-09 ENCOUNTER — Telehealth: Payer: Self-pay | Admitting: Interventional Cardiology

## 2019-04-09 MED ORDER — METOPROLOL TARTRATE 25 MG PO TABS: 12.5000 mg | ORAL_TABLET | Freq: Two times a day (BID) | ORAL | 1 refills | Status: DC

## 2019-04-09 MED ORDER — VALSARTAN 160 MG PO TABS: 160.0000 mg | ORAL_TABLET | Freq: Two times a day (BID) | ORAL | 1 refills | Status: DC

## 2019-04-09 NOTE — Telephone Encounter (Signed)
Called and spoke to patient. Patient will decrease his metoprolol to 12.5 mg BID and increase his valsartan to 160 mg BID. Made patient aware that when we get his EKG on 10/23 if he is in NSR we will likely d/c his amio and this will help with his HRs. Instructed patient to continue to monitor BP and HR and let us know if he has any issues. Patient verbalized understanding and thanked me for the call.

## 2019-04-09 NOTE — Telephone Encounter (Signed)
° ° °  Patient would like a phone call to discuss BP and possible medication changes. Advised patient MyChart message has been received. BP today after taking medication 139/80 HR 54  Please call

## 2019-04-09 NOTE — Telephone Encounter (Signed)
Already spoke to patient see MyChart encounter from 10/5

## 2019-04-09 NOTE — Telephone Encounter (Signed)
See other Mychart encounter from 10/5

## 2019-04-09 NOTE — Telephone Encounter (Signed)
See Mychart message from 10/5

## 2019-04-12 MED ORDER — AMLODIPINE BESYLATE 2.5 MG PO TABS: 2.5000 mg | ORAL_TABLET | Freq: Every day | ORAL | 0 refills | Status: DC

## 2019-04-12 NOTE — Telephone Encounter (Signed)
See MyChart message from 10/9

## 2019-04-12 NOTE — Telephone Encounter (Signed)
Called and spoke to patient. Made him aware that Dr. Irish Lack would like for him to start amlodipine 2.5 mg QD. Patient will continue AM dose of metoprolol and hold PM dose if HR <60 bpm. Patient will monitor BP and send in readings in 1-2 weeks. Patient verbalized understanding. Rx sent to preferred pharmacy.

## 2019-04-12 NOTE — Telephone Encounter (Addendum)
° ° °  Please return call to patient °

## 2019-04-15 DIAGNOSIS — E782 Mixed hyperlipidemia: Secondary | ICD-10-CM | POA: Diagnosis not present

## 2019-04-15 DIAGNOSIS — T826XXA Infection and inflammatory reaction due to cardiac valve prosthesis, initial encounter: Secondary | ICD-10-CM | POA: Diagnosis not present

## 2019-04-15 DIAGNOSIS — I10 Essential (primary) hypertension: Secondary | ICD-10-CM | POA: Diagnosis not present

## 2019-04-17 DIAGNOSIS — Z952 Presence of prosthetic heart valve: Secondary | ICD-10-CM | POA: Diagnosis not present

## 2019-04-17 DIAGNOSIS — Z7901 Long term (current) use of anticoagulants: Secondary | ICD-10-CM | POA: Diagnosis not present

## 2019-04-18 DIAGNOSIS — I339 Acute and subacute endocarditis, unspecified: Secondary | ICD-10-CM | POA: Diagnosis not present

## 2019-04-19 ENCOUNTER — Telehealth: Payer: Self-pay | Admitting: Interventional Cardiology

## 2019-04-19 NOTE — Telephone Encounter (Signed)
See phone note from earlier today. 

## 2019-04-19 NOTE — Telephone Encounter (Signed)
New message  Pt c/o medication issue:  1. Name of Medication: metoprolol tartrate (LOPRESSOR) 25 MG tablet  2. How are you currently taking this medication (dosage and times per day)? 1/2 pill   3. Are you having a reaction (difficulty breathing--STAT)? No   4. What is your medication issue? Patient states that this medication is causing palpitations and wants to know if he should keep taking medication. Please advise.

## 2019-04-19 NOTE — Telephone Encounter (Signed)
Called and spoke to patient regarding his Mychart Messages. Patient made aware to increase amlodipine to 2.5 mg BID. Patient made aware that his BP should normalized after he recovers from his infection. Patient will continue taking metoprolol 12.5 mg in the AM and will hold the PM dose if HR <60. Patient recently had BMET done on 10/12 with K- 4.3, Cr- 0.86 (in Care Everywhere). Patient is scheduled for labs, echo, EKG nurse visit on 10/23 and VIDEO VISIT with Dr. Irish Lack on 10/26. Patient verbalized understanding and will let us know if he needs anything before then.

## 2019-04-26 ENCOUNTER — Ambulatory Visit (HOSPITAL_COMMUNITY): Payer: Medicare Other | Attending: Cardiology

## 2019-04-26 ENCOUNTER — Other Ambulatory Visit: Payer: Self-pay

## 2019-04-26 ENCOUNTER — Ambulatory Visit (INDEPENDENT_AMBULATORY_CARE_PROVIDER_SITE_OTHER): Payer: Medicare Other

## 2019-04-26 ENCOUNTER — Other Ambulatory Visit: Payer: Medicare Other | Admitting: *Deleted

## 2019-04-26 ENCOUNTER — Other Ambulatory Visit: Payer: Self-pay | Admitting: Interventional Cardiology

## 2019-04-26 VITALS — BP 138/68 | HR 58 | Ht 72.0 in | Wt 180.0 lb

## 2019-04-26 DIAGNOSIS — Z952 Presence of prosthetic heart valve: Secondary | ICD-10-CM | POA: Diagnosis not present

## 2019-04-26 DIAGNOSIS — I350 Nonrheumatic aortic (valve) stenosis: Secondary | ICD-10-CM

## 2019-04-26 DIAGNOSIS — I4891 Unspecified atrial fibrillation: Secondary | ICD-10-CM | POA: Diagnosis not present

## 2019-04-26 DIAGNOSIS — I10 Essential (primary) hypertension: Secondary | ICD-10-CM | POA: Diagnosis not present

## 2019-04-26 DIAGNOSIS — E782 Mixed hyperlipidemia: Secondary | ICD-10-CM

## 2019-04-26 LAB — CBC
Hematocrit: 41.1 % (ref 37.5–51.0)
Hemoglobin: 13.1 g/dL (ref 13.0–17.7)
MCH: 27.9 pg (ref 26.6–33.0)
MCHC: 31.9 g/dL (ref 31.5–35.7)
MCV: 87 fL (ref 79–97)
Platelets: 331 10*3/uL (ref 150–450)
RBC: 4.7 x10E6/uL (ref 4.14–5.80)
RDW: 13.1 % (ref 11.6–15.4)
WBC: 9.3 10*3/uL (ref 3.4–10.8)

## 2019-04-26 LAB — LIPID PANEL
Chol/HDL Ratio: 2.4 ratio (ref 0.0–5.0)
Cholesterol, Total: 116 mg/dL (ref 100–199)
HDL: 48 mg/dL (ref >39)
LDL Chol Calc (NIH): 51 mg/dL (ref 0–99)
Triglycerides: 84 mg/dL (ref 0–149)
VLDL Cholesterol Cal: 17 mg/dL (ref 5–40)

## 2019-04-26 LAB — BASIC METABOLIC PANEL
BUN/Creatinine Ratio: 27 — ABNORMAL HIGH (ref 10–24)
BUN: 24 mg/dL (ref 8–27)
CO2: 25 mmol/L (ref 20–29)
Calcium: 9.9 mg/dL (ref 8.6–10.2)
Chloride: 102 mmol/L (ref 96–106)
Creatinine, Ser: 0.89 mg/dL (ref 0.76–1.27)
GFR calc Af Amer: 105 mL/min/{1.73_m2} (ref >59)
GFR calc non Af Amer: 91 mL/min/{1.73_m2} (ref >59)
Glucose: 92 mg/dL (ref 65–99)
Potassium: 4.4 mmol/L (ref 3.5–5.2)
Sodium: 141 mmol/L (ref 134–144)

## 2019-04-26 NOTE — Addendum Note (Signed)
Addended by: Drue Novel I on: 04/26/2019 11:14 AM   Modules accepted: Level of Service

## 2019-04-26 NOTE — Patient Instructions (Addendum)
Medication Instructions:  Your physician has recommended you make the following change in your medication:   STOP: amiodarone   Follow-Up: . Keep Virtual Visit with Dr. Irish Lack on 04/29/19 at 10:00 AM  Any Other Special Instructions Will Be Listed Below (If Applicable).

## 2019-04-26 NOTE — Progress Notes (Signed)
1.) Reason for visit: EKG  2.) Name of MD requesting visit: Dr. Irish Lack  3.) H&P: patient s/p Redo AVR + Root on 9/3 at [facility] w/ post-op Afib  4.) Assessment and plan per MD: Patient here for repeat EKG. Patient is completely asymptomatic other than some anxiety. EKG showed Sinus Bradycardia 58 bpm. Patient had his echocardiogram today and labs as well. He has a virtual visit with Dr. Irish Lack on 10/26. Reviewed with Dr. Irish Lack and patient will stop Amiodarone and keep virtual visit on Monday.

## 2019-04-28 NOTE — Progress Notes (Signed)
Virtual Visit via Telephone Note   This visit type was conducted due to national recommendations for restrictions regarding the COVID-19 Pandemic (e.g. social distancing) in an effort to limit this patient's exposure and mitigate transmission in our community.  Due to his co-morbid illnesses, this patient is at least at moderate risk for complications without adequate follow up.  This format is felt to be most appropriate for this patient at this time.  The patient did not have access to video technology/had technical difficulties with video requiring transitioning to audio format only (telephone).  All issues noted in this document were discussed and addressed.  No physical exam could be performed with this format.  Please refer to the patient's chart for his  consent to telehealth for [facility].   Video portion added.    Date:  04/29/2019   ID:  [name], DOB [birth year], MRN [id number]  Patient Location: Home Provider Location: Home  PCP:  Nickola Major, MD  Cardiologist:  Larae Grooms, MD  Electrophysiologist:  None   Evaluation Performed:  Follow-Up Visit  Chief Complaint:  S/p AVR  History of Present Illness:    Jeffery Whitehead is a 63 y.o. male with S/P St. Jude AVR 1983 at age 34 on coumadin, MSSA basteremia and endocarditis 08/2016 presumed secondary to lumbar discitis. Echo 12/2018 showed severe AS but asymptomatic. Cath 01/22/19 nonobstructive CAD, TEE severe AS. He was evaluated by Dr. Cyndia Bent 7/23/ 20 felt to be perivalvular leak secondary to endocarditis. It was agreed that he could have hernia repair prior to valve surgery to help him ambulate post op.  Patient underwent bilateral inguinal herniae repair 02/05/19. Recovering well. Walking 10,000 steps daily. Denies shortness or breath, chest pain. Dizziness or presyncope. Went to the [place] clinic since he saw Dr. Cyndia Bent for a second opinion. They agreed he needs his valve replaced but he had it done at the  [facility].   In 03/2019, he had :"Redo sternotomy, aortic valve / root replacement with 43mm Homograft, reimplantation of the coronary arteries. " for : :DIAGNOSIS: Severe aortic stenosis, severe aortic regurgitation, aortic root pseudoaneurysm, aortic valve endocarditis, prosthetic valve endocarditis, status post AVR by Dr. Leona Carry, and chronic diastolic New York Heart Association class 3 heart failure. SURGEON: Evans Lance, M.D.  He had post op AFib: "Went into afib (9/6) associated with palpitation, converted to sinus after IV amiodarone, and but this morning went into atrial flutter, HR 70s, HD stable, asymptomatic" On f/u visit records: "post op A F/Flutter. s/p TEE / [place] to NSR 03/12/19 pt d/c on Amio taper, BB and AC with coumadin  EKG today SB with HR 50 bpm. with 1st AVB PRi 254 ms "  Was sent home on Amiodarone.  We have tapered his AMio and adjusted BP meds over the last few weeks.  Amio was stopped on 04/26/2019.    Denies : Chest pain. Dizziness. Leg edema. Nitroglycerin use. Orthopnea. Palpitations. Paroxysmal nocturnal dyspnea. Shortness of breath. Syncope.   The patient does not have symptoms concerning for COVID-19 infection (fever, chills, cough, or new shortness of breath).    Past Medical History:  Diagnosis Date  . Acute encephalopathy    Likely acute metabolic encephalopathy secondary to sepsis  . Anemia   . Asthma   . Bloody nose    occ  . Cold   . Decreased anal sphincter tone   . Fatty liver 2018  . GERD (gastroesophageal reflux disease)   . Heart murmur   .  Heart valve replaced    1983   st jude  . History of aortic insufficiency   . History of aortic stenosis   . History of endocarditis 2017   prosthetic aortic valve endocarditis,   . History of shingles 2019  . Hypertension   . Insomnia   . Lumbar discitis   . Mixed hyperlipidemia   . MSSA bacteremia 2018   Sepsis  . Nasal septal deviation   . Obesity   . Pre-diabetes   .  Seborrheic keratosis   . Shortness of breath dyspnea    Chronic  . Sleep apnea    cpap nightly   . Umbilical hernia   . Varicose veins    Past Surgical History:  Procedure Laterality Date  . AORTIC VALVE REPLACEMENT  1983   St. Jude  . BUBBLE STUDY  01/22/2019   Procedure: BUBBLE STUDY;  Surgeon: Elouise Munroe, MD;  Location: [place];  Service: Cardiology;;  . Kathryne Hitch GUIDANCE N/A 08/24/2016   Procedure: Fluoroscopy Guidance;  Surgeon: Leonie Man, MD;  Location: [place] [facility];  Service: Cardiovascular;  Laterality: N/A;  . INGUINAL HERNIA REPAIR Bilateral 02/05/2019   Procedure: LAPAROSCOPIC BILATERAL INGUINAL HERNIA REPAIRS WITH MESH, RIGHT FEMORAL HERNIA REPAIR, TAPP APPROACH, TAP BLOCK BILATERALLY;  Surgeon: Michael Boston, MD;  Location: [facility];  Service: General;  Laterality: Bilateral;  . INSERTION OF MESH N/A 06/13/2018   Procedure: INSERTION OF MESH;  Surgeon: Michael Boston, MD;  [facility];  Service: General;  Laterality: N/A;  . Nasal cautery    . RIGHT HEART CATH AND CORONARY ANGIOGRAPHY N/A 01/22/2019   Procedure: RIGHT HEART CATH AND CORONARY ANGIOGRAPHY;  Surgeon: [name], MD;  Location: [place] [facility];  Service: Cardiovascular;  Laterality: N/A;  . TEE WITHOUT CARDIOVERSION N/A 08/24/2016   Procedure: TRANSESOPHAGEAL ECHOCARDIOGRAM (TEE);  Surgeon: Pixie Casino, MD;  Location: [facility] ENDOSCOPY;  Service: Cardiovascular;  Laterality: N/A;  . TEE WITHOUT CARDIOVERSION N/A 01/22/2019   Procedure: TRANSESOPHAGEAL ECHOCARDIOGRAM (TEE);  Surgeon: Elouise Munroe, MD;  Location: [place];  Service: Cardiology;  Laterality: N/A;  . TONSILLECTOMY    . VEIN LIGATION AND STRIPPING Left 08/15/2014   Procedure: SAPHENOUS VEIN LIGATION AND EXCISION OF VARICOSITIES;  Surgeon: Mal Misty, MD;  Location: [place];  Service: Vascular;  Laterality: Left;  Marland Kitchen VENTRAL HERNIA REPAIR N/A 06/13/2018   Procedure: LAPAROSCOPIC VENTRAL WALL HERNIA REPAIR  ERAS PATHWAY;  Surgeon: Michael Boston, MD;  [facility];  Service: General;  Laterality: N/A;     Current Meds  Medication Sig  . albuterol (VENTOLIN HFA) 108 (90 Base) MCG/ACT inhaler Inhale 2 puffs into the lungs every 6 (six) hours as needed for wheezing or shortness of breath.  Marland Kitchen amLODipine (NORVASC) 2.5 MG tablet Take 1 tablet (2.5 mg total) by mouth 2 (two) times daily.  Marland Kitchen amoxicillin (AMOXIL) 500 MG capsule Take 2,000 mg by mouth See admin instructions. Take 2000 mg 1 hour prior to dental procedures  . aspirin 81 MG chewable tablet Chew 81 mg by mouth daily.  . cetirizine (ZYRTEC) 10 MG tablet Take 10 mg by mouth daily.  . diphenhydramine-acetaminophen (TYLENOL PM) 25-500 MG TABS tablet Take 1 tablet by mouth at bedtime.  . ferrous sulfate 324 (65 Fe) MG TBEC Take 324 mg by mouth daily.   . metoprolol tartrate (LOPRESSOR) 25 MG tablet Take 0.5 tablets (12.5 mg total) by mouth 2 (two) times daily. Only take if heart rate greater than  60  . Multiple Vitamins-Minerals (CENTRUM SILVER ADULT 50+ PO) Take 1 tablet by mouth daily with breakfast.  . omeprazole (PRILOSEC) 20 MG capsule Take 20 mg by mouth daily.   . predniSONE (DELTASONE) 5 MG tablet Take 5 mg by mouth daily.  . Psyllium (METAMUCIL PO) Take 1 Dose by mouth daily.  . rosuvastatin (CRESTOR) 5 MG tablet Take 5 mg by mouth at bedtime.   . senna-docusate (SENOKOT S) 8.6-50 MG tablet Take 1 tablet by mouth daily as needed.  . Simethicone 250 MG CAPS Take 250 mg by mouth daily.  . traMADol (ULTRAM) 50 MG tablet Take 1 tablet by mouth daily as needed.  . valsartan (DIOVAN) 160 MG tablet Take 1 tablet (160 mg total) by mouth 2 (two) times daily.  Marland Kitchen warfarin (COUMADIN) 5 MG tablet Take 5 mg by mouth daily.   Marland Kitchen zolpidem (AMBIEN) 10 MG tablet Take 10 mg by mouth at bedtime.      Allergies:   Patient has no known allergies.   Social History   Tobacco Use  . Smoking status: Never Smoker  . Smokeless tobacco: Never Used   Substance Use Topics  . Alcohol use: Yes    Alcohol/week: 0.0 standard drinks    Comment: wine at night. 12 ounce can of beer ech day   . Drug use: No     Family Hx: The patient's family history includes Heart disease in his father.  ROS:   Please see the history of present illness.    Has felt some anxiety; has noticed that his nipples are erect and uncomfortable when rubbing against his t-shirt All other systems reviewed and are negative.   Prior CV studies:   The following studies were reviewed today:  Echo result reviewed  Labs/Other Tests and Data Reviewed:    EKG:  An ECG dated 04/26/2019 was personally reviewed today and demonstrated:  NSR, LVH TWI  Recent Labs: 04/26/2019: BUN 24; Creatinine, Ser 0.89; Hemoglobin 13.1; Platelets 331; Potassium 4.4; Sodium 141   Recent Lipid Panel Lab Results  Component Value Date/Time   CHOL 116 04/26/2019 07:53 AM   TRIG 84 04/26/2019 07:53 AM   HDL 48 04/26/2019 07:53 AM   CHOLHDL 2.4 04/26/2019 07:53 AM   CHOLHDL 3.7 03/08/2016 08:37 AM   LDLCALC 51 04/26/2019 07:53 AM    Wt Readings from Last 3 Encounters:  04/29/19 181 lb 3.2 oz (82.2 kg)  04/26/19 180 lb (81.6 kg)  03/27/19 185 lb (83.9 kg)     Objective:    Vital Signs:  BP 137/81   Pulse 71   Temp 97.8 F (36.6 C)   Ht 6' (1.829 m)   Wt 181 lb 3.2 oz (82.2 kg)   BMI 24.58 kg/m    VITAL SIGNS:  reviewed GEN:  no acute distress RESPIRATORY:  normal respiratory effort, symmetric expansion NEURO:  alert and oriented x 3, no obvious focal deficit  ASSESSMENT & PLAN:    1. S/p repeat AVR: h/o SBE.  Needs SBE prophylaxis.  Homograft in place.  2. HTN: The current medical regimen is effective;  continue present plan and medications. 3. AFib/Atrial flutter: On Coumadin now. Plan before surgery was for him to come off of COumadin long term.  It was restarted when he had AFib.  Will think about stopping Coumadin at next visit. 4. Hyperlipidemia: LDL 51.  The  current medical regimen is effective;  continue present plan and medications. 5. Leg edema/varicose veins: Elevate legs.   COVID-19  Education: The signs and symptoms of COVID-19 were discussed with the patient and how to seek care for testing (follow up with PCP or arrange E-visit).  The importance of social distancing was discussed today.  Time:   Today, I have spent 25 minutes with the patient with telehealth technology discussing the above problems.     Medication Adjustments/Labs and Tests Ordered: Current medicines are reviewed at length with the patient today.  Concerns regarding medicines are outlined above.   Tests Ordered: No orders of the defined types were placed in this encounter.   Medication Changes: No orders of the defined types were placed in this encounter.   Follow Up:  Either In Person or Virtual in 6 weeks  Signed, Larae Grooms, MD  04/29/2019 11:14 AM    [place]

## 2019-04-29 ENCOUNTER — Encounter: Payer: Self-pay | Admitting: Interventional Cardiology

## 2019-04-29 ENCOUNTER — Telehealth (INDEPENDENT_AMBULATORY_CARE_PROVIDER_SITE_OTHER): Payer: Medicare Other | Admitting: Interventional Cardiology

## 2019-04-29 ENCOUNTER — Other Ambulatory Visit: Payer: Self-pay

## 2019-04-29 VITALS — BP 137/81 | HR 71 | Temp 97.8°F | Ht 72.0 in | Wt 181.2 lb

## 2019-04-29 DIAGNOSIS — I10 Essential (primary) hypertension: Secondary | ICD-10-CM

## 2019-04-29 DIAGNOSIS — E782 Mixed hyperlipidemia: Secondary | ICD-10-CM

## 2019-04-29 DIAGNOSIS — R600 Localized edema: Secondary | ICD-10-CM | POA: Diagnosis not present

## 2019-04-29 DIAGNOSIS — Z952 Presence of prosthetic heart valve: Secondary | ICD-10-CM | POA: Diagnosis not present

## 2019-04-29 DIAGNOSIS — Z7901 Long term (current) use of anticoagulants: Secondary | ICD-10-CM

## 2019-04-29 DIAGNOSIS — I83899 Varicose veins of unspecified lower extremities with other complications: Secondary | ICD-10-CM | POA: Diagnosis not present

## 2019-04-29 NOTE — Patient Instructions (Addendum)
Medication Instructions:  Your physician recommends that you continue on your current medications as directed. Please refer to the Current Medication list given to you today.  If you need a refill on your cardiac medications before your next appointment, please call your pharmacy.   Lab work: None Ordered  If you have labs (blood work) drawn today and your tests are completely normal, you will receive your results only by: Marland Kitchen MyChart Message (if you have MyChart) OR . A paper copy in the mail If you have any lab test that is abnormal or we need to change your treatment, we will call you to review the results.  Testing/Procedures: Your physician has requested that you have an echocardiogram on 08/05/19 at 8:20 AM. Arrive at 8:05 AM. Echocardiography is a painless test that uses sound waves to create images of your heart. It provides your doctor with information about the size and shape of your heart and how well your heart's chambers and valves are working. This procedure takes approximately one hour. There are no restrictions for this procedure.  Follow-Up: . Follow up with Dr. Irish Lack via [place] on 06/04/19 at 11:40 AM.  Any Other Special Instructions Will Be Listed Below (If Applicable).

## 2019-05-03 DIAGNOSIS — Z6825 Body mass index (BMI) 25.0-25.9, adult: Secondary | ICD-10-CM | POA: Diagnosis not present

## 2019-05-03 DIAGNOSIS — R5382 Chronic fatigue, unspecified: Secondary | ICD-10-CM | POA: Diagnosis not present

## 2019-05-03 DIAGNOSIS — M0600 Rheumatoid arthritis without rheumatoid factor, unspecified site: Secondary | ICD-10-CM | POA: Diagnosis not present

## 2019-05-03 DIAGNOSIS — Z952 Presence of prosthetic heart valve: Secondary | ICD-10-CM | POA: Diagnosis not present

## 2019-05-03 DIAGNOSIS — Z7901 Long term (current) use of anticoagulants: Secondary | ICD-10-CM | POA: Diagnosis not present

## 2019-05-03 DIAGNOSIS — R634 Abnormal weight loss: Secondary | ICD-10-CM | POA: Diagnosis not present

## 2019-05-03 DIAGNOSIS — M2550 Pain in unspecified joint: Secondary | ICD-10-CM | POA: Diagnosis not present

## 2019-05-14 ENCOUNTER — Telehealth: Payer: Self-pay

## 2019-05-14 NOTE — Telephone Encounter (Signed)
I spoke to the patient who is calling because he lifted a suitcase 2 days ago and has since then an aching in his lower chest (midway between waist and neck).  It is persistent, but is not radiating to anywhere else in the body.    He denies SOB or other symptoms.  His BP 126/77 and HR 69 are stable.  I advised him to monitor in case of just a pulled muscle, but if pain worsens or any other symptoms occur, he should go to the ED.  He verbalized understanding.

## 2019-05-16 DIAGNOSIS — R450 Nervousness: Secondary | ICD-10-CM | POA: Diagnosis not present

## 2019-05-16 DIAGNOSIS — Z7901 Long term (current) use of anticoagulants: Secondary | ICD-10-CM | POA: Diagnosis not present

## 2019-06-02 NOTE — Progress Notes (Signed)
Virtual Visit via Video Note   This visit type was conducted due to national recommendations for restrictions regarding the COVID-19 Pandemic (e.g. social distancing) in an effort to limit this patient's exposure and mitigate transmission in our community.  Due to his co-morbid illnesses, this patient is at least at moderate risk for complications without adequate follow up.  This format is felt to be most appropriate for this patient at this time.  All issues noted in this document were discussed and addressed.  A limited physical exam was performed with this format.  Please refer to the patient's chart for his consent to telehealth for [facility].   Date:  06/04/2019   ID:  Jeffery Whitehead, DOB 05-27-1956, MRN [id number]  Patient Location: Home Provider Location: Office  PCP:  Nickola Major, MD  Cardiologist:  Larae Grooms, MD  Electrophysiologist:  None   Evaluation Performed:  Follow-Up Visit  Chief Complaint:  S/p AVR  History of Present Illness:    Jeffery Whitehead is a 63 y.o. male with S/P St. Jude AVR 1983 at age 21 on coumadin, MSSA basteremia and endocarditis 08/2016 presumed secondary to lumbar discitis. Echo 12/2018 showed severe AS but asymptomatic. Cath 01/22/19 nonobstructive CAD, TEE severe AS. He was evaluated by Dr. Cyndia Bent 7/23/ 20 felt to be perivalvular leak secondary to endocarditis. It was agreed that he could have hernia repair prior to valve surgery to help him ambulate post op.  Patient underwent bilateral inguinal herniae repair 02/05/19. Recovering well. Walking 10,000 steps daily. Denies shortness or breath, chest pain. Dizziness or presyncope. Went to the [place] clinic since he saw Dr. Cyndia Bent for a second opinion. They agreed he needs his valve replaced but he had it done at the [facility].  In 03/2019 at [facility], he had :"Redo sternotomy, aortic valve / root replacement with 1mm Homograft, reimplantation of the coronary arteries. "  for : :DIAGNOSIS: Severe aortic stenosis, severe aortic regurgitation, aortic root pseudoaneurysm, aortic valve endocarditis, prosthetic valve endocarditis, status post AVR by Dr. Leona Carry, and chronic diastolic New York Heart Association class 3 heart failure. SURGEON: Evans Lance, M.D.  He had post op AFib: "Went into afib (9/6) associated with palpitation, converted to sinus after IV amiodarone, and but this morning went into atrial flutter, HR 70s, HD stable, asymptomatic" On f/u visit records: "post op A F/Flutter. s/p TEE / [place] to NSR 03/12/19 pt d/c on Amio taper, BB and AC with coumadin  EKG today SB with HR 50 bpm. with 1st AVB PRi 254 ms "  Was sent home on Amiodarone.  We have tapered his AMio and adjusted BP meds over the last few weeks.  Amio was stopped on 04/26/2019.    Plan at last visit was: "On Coumadin now. Plan before surgery was for him to come off of COumadin long term.  It was restarted when he had AFib.  Will think about stopping Coumadin at next visit."  BP has been volatile and meds have been adjusted, with amlodipine initiated.  HR has ranged from 49-80s.  The patient does not have symptoms concerning for COVID-19 infection (fever, chills, cough, or new shortness of breath).  Since the last visit: Denies : Chest pain. Dizziness. Leg edema. Nitroglycerin use. Orthopnea. Palpitations. Paroxysmal nocturnal dyspnea. Shortness of breath. Syncope.   I Weight has been stable.  Ocasional MSK pain when he stretches the wrong way.   He prefers to stay on anticoagulation.    Trying to maintatin weight loss.  Past Medical History:  Diagnosis Date  . Acute encephalopathy    Likely acute metabolic encephalopathy secondary to sepsis  . Anemia   . Asthma   . Bloody nose    occ  . Cold   . Decreased anal sphincter tone   . Fatty liver 2018  . GERD (gastroesophageal reflux disease)   . Heart murmur   . Heart valve replaced    1983   st jude  . History of  aortic insufficiency   . History of aortic stenosis   . History of endocarditis 2017   prosthetic aortic valve endocarditis,   . History of shingles 2019  . Hypertension   . Insomnia   . Lumbar discitis   . Mixed hyperlipidemia   . MSSA bacteremia 2018   Sepsis  . Nasal septal deviation   . Obesity   . Pre-diabetes   . Seborrheic keratosis   . Shortness of breath dyspnea    Chronic  . Sleep apnea    cpap nightly   . Umbilical hernia   . Varicose veins    Past Surgical History:  Procedure Laterality Date  . AORTIC VALVE REPLACEMENT  1983   St. Jude  . BUBBLE STUDY  01/22/2019   Procedure: BUBBLE STUDY;  Surgeon: Elouise Munroe, MD;  Location: [place];  Service: Cardiology;;  . Kathryne Hitch GUIDANCE N/A 08/24/2016   Procedure: Fluoroscopy Guidance;  Surgeon: Leonie Man, MD;  Location: [place] [facility];  Service: Cardiovascular;  Laterality: N/A;  . INGUINAL HERNIA REPAIR Bilateral 02/05/2019   Procedure: LAPAROSCOPIC BILATERAL INGUINAL HERNIA REPAIRS WITH MESH, RIGHT FEMORAL HERNIA REPAIR, TAPP APPROACH, TAP BLOCK BILATERALLY;  Surgeon: Michael Boston, MD;  [facility];  Service: General;  Laterality: Bilateral;  . INSERTION OF MESH N/A 06/13/2018   Procedure: INSERTION OF MESH;  Surgeon: Michael Boston, MD;  [facility];  Service: General;  Laterality: N/A;  . Nasal cautery    . RIGHT HEART CATH AND CORONARY ANGIOGRAPHY N/A 01/22/2019   Procedure: RIGHT HEART CATH AND CORONARY ANGIOGRAPHY;  Surgeon: [name], MD;  Location: [place] [facility];  Service: Cardiovascular;  Laterality: N/A;  . TEE WITHOUT CARDIOVERSION N/A 08/24/2016   Procedure: TRANSESOPHAGEAL ECHOCARDIOGRAM (TEE);  Surgeon: Pixie Casino, MD;  Location: [facility] ENDOSCOPY;  Service: Cardiovascular;  Laterality: N/A;  . TEE WITHOUT CARDIOVERSION N/A 01/22/2019   Procedure: TRANSESOPHAGEAL ECHOCARDIOGRAM (TEE);  Surgeon: Elouise Munroe, MD;  Location: [place];  Service: Cardiology;   Laterality: N/A;  . TONSILLECTOMY    . VEIN LIGATION AND STRIPPING Left 08/15/2014   Procedure: SAPHENOUS VEIN LIGATION AND EXCISION OF VARICOSITIES;  Surgeon: Mal Misty, MD;  Location: [place];  Service: Vascular;  Laterality: Left;  Marland Kitchen VENTRAL HERNIA REPAIR N/A 06/13/2018   Procedure: LAPAROSCOPIC VENTRAL WALL HERNIA REPAIR ERAS PATHWAY;  Surgeon: Michael Boston, MD;  [facility];  Service: General;  Laterality: N/A;     Current Meds  Medication Sig  . albuterol (VENTOLIN HFA) 108 (90 Base) MCG/ACT inhaler Inhale 2 puffs into the lungs every 6 (six) hours as needed for wheezing or shortness of breath.  Marland Kitchen amLODipine (NORVASC) 2.5 MG tablet Take 1 tablet (2.5 mg total) by mouth 2 (two) times daily.  Marland Kitchen amoxicillin (AMOXIL) 500 MG capsule Take 2,000 mg by mouth See admin instructions. Take 2000 mg 1 hour prior to dental procedures  . aspirin 81 MG chewable tablet Chew 81 mg by mouth daily.  . cetirizine (ZYRTEC) 10 MG  tablet Take 10 mg by mouth daily.  . diphenhydramine-acetaminophen (TYLENOL PM) 25-500 MG TABS tablet Take 1 tablet by mouth at bedtime.  . ferrous sulfate 324 (65 Fe) MG TBEC Take 324 mg by mouth daily.   . folic acid (FOLVITE) 1 MG tablet Take 1 mg by mouth daily.  . methotrexate (RHEUMATREX) 10 MG tablet Take 10 mg by mouth once a week. Caution: Chemotherapy. Protect from light.  . metoprolol tartrate (LOPRESSOR) 25 MG tablet Take 0.5 tablets (12.5 mg total) by mouth 2 (two) times daily. Only take if heart rate greater than 60  . Multiple Vitamins-Minerals (CENTRUM SILVER ADULT 50+ PO) Take 1 tablet by mouth daily with breakfast.  . omeprazole (PRILOSEC) 20 MG capsule Take 20 mg by mouth daily.   . Psyllium (METAMUCIL PO) Take 1 Dose by mouth daily.  . rosuvastatin (CRESTOR) 5 MG tablet Take 5 mg by mouth at bedtime.   . Simethicone 250 MG CAPS Take 250 mg by mouth daily.  . valsartan (DIOVAN) 160 MG tablet Take 1 tablet (160 mg total) by mouth 2 (two) times daily.  Marland Kitchen  warfarin (COUMADIN) 5 MG tablet Take 5 mg by mouth daily.   Marland Kitchen zolpidem (AMBIEN) 10 MG tablet Take 10 mg by mouth at bedtime.      Allergies:   Patient has no known allergies.   Social History   Tobacco Use  . Smoking status: Never Smoker  . Smokeless tobacco: Never Used  Substance Use Topics  . Alcohol use: Yes    Alcohol/week: 0.0 standard drinks    Comment: wine at night. 12 ounce can of beer ech day   . Drug use: No     Family Hx: The patient's family history includes Heart disease in his father.  ROS:   Please see the history of present illness.     All other systems reviewed and are negative.   Prior CV studies:   The following studies were reviewed today:  Prior records reviewed  Labs/Other Tests and Data Reviewed:     Recent Labs: 04/26/2019: BUN 24; Creatinine, Ser 0.89; Hemoglobin 13.1; Platelets 331; Potassium 4.4; Sodium 141   Recent Lipid Panel Lab Results  Component Value Date/Time   CHOL 116 04/26/2019 07:53 AM   TRIG 84 04/26/2019 07:53 AM   HDL 48 04/26/2019 07:53 AM   CHOLHDL 2.4 04/26/2019 07:53 AM   CHOLHDL 3.7 03/08/2016 08:37 AM   LDLCALC 51 04/26/2019 07:53 AM    Wt Readings from Last 3 Encounters:  06/04/19 189 lb (85.7 kg)  04/29/19 181 lb 3.2 oz (82.2 kg)  04/26/19 180 lb (81.6 kg)     Objective:    Vital Signs:  BP 133/81   Pulse (!) 57   Ht 6' (1.829 m)   Wt 189 lb (85.7 kg)   BMI 25.63 kg/m    VITAL SIGNS:  reviewed GEN:  no acute distress RESPIRATORY:  normal respiratory effort, symmetric expansion CARDIOVASCULAR:  no peripheral edema NEURO:  alert and oriented x 3, no obvious focal deficit PSYCH:  normal affect limited exam from video format  ASSESSMENT & PLAN:    1. S/p AVR: Needs SBE prophylaxis.  No CHF sx.   2. HTN: The current medical regimen is effective;  continue present plan and medications. 3. AFib/flutter: OK to continue 6.25 mgBID of metoprolol.  He prefers Eliquis over Coumadin and would like to  stay on some anticoagulation given his AFib.  He has an Apple watch that will detect arrhythmia.  No bleeding issues.  4. Hyperlipidemia: LDL 51 in 04/2019 5. Leg edema: Prior venous insufficiency.   COVID-19 Education: The signs and symptoms of COVID-19 were discussed with the patient and how to seek care for testing (follow up with PCP or arrange E-visit).  The importance of social distancing was discussed today.  Time:   Today, I have spent 15 minutes with the patient with telehealth technology discussing the above problems.     Medication Adjustments/Labs and Tests Ordered: Current medicines are reviewed at length with the patient today.  Concerns regarding medicines are outlined above.   Tests Ordered: No orders of the defined types were placed in this encounter.   Medication Changes: No orders of the defined types were placed in this encounter.   Follow Up:  Virtual Visit  in 3 month(s)  Signed, Larae Grooms, MD  06/04/2019 11:28 AM    [place]

## 2019-06-03 DIAGNOSIS — Z0000 Encounter for general adult medical examination without abnormal findings: Secondary | ICD-10-CM | POA: Diagnosis not present

## 2019-06-04 ENCOUNTER — Other Ambulatory Visit: Payer: Self-pay

## 2019-06-04 ENCOUNTER — Telehealth (INDEPENDENT_AMBULATORY_CARE_PROVIDER_SITE_OTHER): Payer: Medicare Other | Admitting: Interventional Cardiology

## 2019-06-04 ENCOUNTER — Encounter: Payer: Self-pay | Admitting: Interventional Cardiology

## 2019-06-04 VITALS — BP 133/81 | HR 57 | Ht 72.0 in | Wt 189.0 lb

## 2019-06-04 DIAGNOSIS — E782 Mixed hyperlipidemia: Secondary | ICD-10-CM

## 2019-06-04 DIAGNOSIS — I10 Essential (primary) hypertension: Secondary | ICD-10-CM

## 2019-06-04 DIAGNOSIS — R600 Localized edema: Secondary | ICD-10-CM | POA: Diagnosis not present

## 2019-06-04 DIAGNOSIS — Z952 Presence of prosthetic heart valve: Secondary | ICD-10-CM | POA: Diagnosis not present

## 2019-06-04 DIAGNOSIS — I4891 Unspecified atrial fibrillation: Secondary | ICD-10-CM

## 2019-06-04 DIAGNOSIS — Z7901 Long term (current) use of anticoagulants: Secondary | ICD-10-CM | POA: Diagnosis not present

## 2019-06-04 MED ORDER — APIXABAN 5 MG PO TABS: 5.0000 mg | ORAL_TABLET | Freq: Two times a day (BID) | ORAL | 11 refills | Status: AC

## 2019-06-04 NOTE — Telephone Encounter (Signed)
Addressed at Virtual Visit today

## 2019-06-04 NOTE — Patient Instructions (Signed)
Medication Instructions:  Your physician has recommended you make the following change in your medication:   1. STOP: coumadin  2. START: eliquis 5 mg tablet tomorrow morning: Take 1 tablet by mouth twice a day  If you need a refill on your cardiac medications before your next appointment, please call your pharmacy.   Lab work: None Ordered  If you have labs (blood work) drawn today and your tests are completely normal, you will receive your results only by: Marland Kitchen MyChart Message (if you have MyChart) OR . A paper copy in the mail If you have any lab test that is abnormal or we need to change your treatment, we will call you to review the results.  Testing/Procedures: None ordered  Follow-Up: . Follow up with Dr. Irish Lack via VIDEO Visit on 09/03/19 at 10:20 AM  Any Other Special Instructions Will Be Listed Below (If Applicable).

## 2019-06-10 ENCOUNTER — Encounter

## 2019-07-19 ENCOUNTER — Telehealth: Payer: Self-pay | Admitting: Interventional Cardiology

## 2019-07-19 NOTE — Telephone Encounter (Signed)
Dexilant is a substitute for omeprazole.  He does not need both.  JV

## 2019-07-19 NOTE — Telephone Encounter (Signed)
Pt c/o medication issue:  1. Name of Medication:  Dexilant Omeprazole   2. How are you currently taking this medication (dosage and times per day)? Omeprazole once in the morning  3. Are you having a reaction (difficulty breathing--STAT)? no  4. What is your medication issue?  Patient calling in regards to his mychart message. He would like to start taking both Dexilant and omeprazole. He states his Dentist told him his teeth are rotting from acid so he would like to know if he can take both medications.

## 2019-07-19 NOTE — Telephone Encounter (Signed)
Will route to Dr. Irish Lack to see if he is ok with pt taking both Dexilant and Omeprazole?

## 2019-07-22 NOTE — Telephone Encounter (Signed)
Dr. Irish Lack responded to pt's MyChart message.  Pt has read the message.  Will close encounter.

## 2019-07-24 DIAGNOSIS — R399 Unspecified symptoms and signs involving the genitourinary system: Secondary | ICD-10-CM | POA: Diagnosis not present

## 2019-07-24 DIAGNOSIS — R0789 Other chest pain: Secondary | ICD-10-CM | POA: Diagnosis not present

## 2019-07-24 DIAGNOSIS — K219 Gastro-esophageal reflux disease without esophagitis: Secondary | ICD-10-CM | POA: Diagnosis not present

## 2019-08-01 DIAGNOSIS — Z125 Encounter for screening for malignant neoplasm of prostate: Secondary | ICD-10-CM | POA: Diagnosis not present

## 2019-08-01 DIAGNOSIS — Z954 Presence of other heart-valve replacement: Secondary | ICD-10-CM | POA: Diagnosis not present

## 2019-08-01 DIAGNOSIS — Z7982 Long term (current) use of aspirin: Secondary | ICD-10-CM | POA: Diagnosis not present

## 2019-08-01 DIAGNOSIS — R079 Chest pain, unspecified: Secondary | ICD-10-CM | POA: Diagnosis not present

## 2019-08-01 DIAGNOSIS — K219 Gastro-esophageal reflux disease without esophagitis: Secondary | ICD-10-CM | POA: Diagnosis not present

## 2019-08-01 DIAGNOSIS — T826XXA Infection and inflammatory reaction due to cardiac valve prosthesis, initial encounter: Secondary | ICD-10-CM | POA: Diagnosis not present

## 2019-08-01 DIAGNOSIS — R351 Nocturia: Secondary | ICD-10-CM | POA: Diagnosis not present

## 2019-08-01 DIAGNOSIS — Z7901 Long term (current) use of anticoagulants: Secondary | ICD-10-CM | POA: Diagnosis not present

## 2019-08-01 DIAGNOSIS — R399 Unspecified symptoms and signs involving the genitourinary system: Secondary | ICD-10-CM | POA: Diagnosis not present

## 2019-08-01 DIAGNOSIS — R350 Frequency of micturition: Secondary | ICD-10-CM | POA: Diagnosis not present

## 2019-08-01 DIAGNOSIS — Z952 Presence of prosthetic heart valve: Secondary | ICD-10-CM | POA: Diagnosis not present

## 2019-08-01 DIAGNOSIS — R1010 Upper abdominal pain, unspecified: Secondary | ICD-10-CM | POA: Diagnosis not present

## 2019-08-01 DIAGNOSIS — R590 Localized enlarged lymph nodes: Secondary | ICD-10-CM | POA: Diagnosis not present

## 2019-08-05 ENCOUNTER — Ambulatory Visit (HOSPITAL_COMMUNITY): Payer: Medicare Other | Attending: Interventional Cardiology

## 2019-08-05 ENCOUNTER — Other Ambulatory Visit: Payer: Self-pay

## 2019-08-05 DIAGNOSIS — Z952 Presence of prosthetic heart valve: Secondary | ICD-10-CM | POA: Diagnosis not present

## 2019-09-01 NOTE — Progress Notes (Signed)
Virtual Visit via Video Note   This visit type was conducted due to national recommendations for restrictions regarding the COVID-19 Pandemic (e.g. social distancing) in an effort to limit this patient's exposure and mitigate transmission in our community.  Due to his co-morbid illnesses, this patient is at least at moderate risk for complications without adequate follow up.  This format is felt to be most appropriate for this patient at this time.  All issues noted in this document were discussed and addressed.  A limited physical exam was performed with this format.  Please refer to the patient's chart for his consent to telehealth for [facility].   Date:  09/03/2019   ID:  Jeffery Whitehead, DOB 1955/09/04, MRN [id number]  Patient Location: Home Provider Location: Office  PCP:  Nickola Major, MD  Cardiologist:  Larae Grooms, MD  Electrophysiologist:  None   Evaluation Performed:  Follow-Up Visit  Chief Complaint: Status post AVR  History of Present Illness:    Jeffery Whitehead is a 64 y.o. male with S/P St. Jude AVR 1983 at age 23 on coumadin, MSSA basteremia and endocarditis 08/2016 presumed secondary to lumbar discitis. Echo 12/2018 showed severe AS but asymptomatic. Cath 01/22/19 nonobstructive CAD, TEE severe AS. He was evaluated by Dr. Cyndia Bent 7/23/ 20 felt to be perivalvular leak secondary to endocarditis. It was agreed that he could have hernia repair prior to valve surgery to help him ambulate post op.  Patient underwent bilateral inguinal herniae repair 02/05/19. Recovering well. Walking 10,000 steps daily. Denies shortness or breath, chest pain. Dizziness or presyncope. Went to the [place] [facility] since he saw Dr. Cyndia Bent for a second opinion. They agreed he needs his valve replacedbut he hadit [facility].  In 03/2019 at [facility], he had :"Redo sternotomy, aortic valve / root replacement with 68mm Homograft, reimplantation of the coronary  arteries." for : :DIAGNOSIS: Severe aortic stenosis, severe aortic regurgitation, aortic root pseudoaneurysm, aortic valve endocarditis, prosthetic valve endocarditis, status post AVR by Dr. Leona Carry, and chronic diastolic New York Heart Association class 3 heart failure. SURGEON: Evans Lance, M.D.  He had post op AFib: "Went into afib (9/6) associated with palpitation, converted to sinus after IV amiodarone, and but this morning went into atrial flutter, HR 70s, HD stable, asymptomatic" On f/u visit records: "post op A F/Flutter. s/p TEE / [place] to NSR 03/12/19 pt d/c on Amio taper, BB and AC with coumadin  EKG today SB with HR 50 bpm. with 1st AVB PRi 254 ms "  Was sent home on Amiodarone.  We have tapered his AMio and adjusted BP meds over the last few weeks.Amio was stopped on 04/26/2019.  He preferred to stay on anticoagulation and Coumadin was changed to Eliquis.  Of note, he has an apple watch that will detect arrhythmia.  Of note, his blood pressure had been somewhat volatile.  This was better controlled back in December 2020.  Since the last visit, he has had an intermittent dull pain at the right lower sternal border.  Worse when changing positions.  Not related to exertion.   The patient does not have symptoms concerning for COVID-19 infection (fever, chills, cough, or new shortness of breath).    Past Medical History:  Diagnosis Date  . Acute encephalopathy    Likely acute metabolic encephalopathy secondary to sepsis  . Anemia   . Asthma   . Bloody nose    occ  . Cold   . Decreased anal sphincter  tone   . Fatty liver 2018  . GERD (gastroesophageal reflux disease)   . Heart murmur   . Heart valve replaced    1983   st jude  . History of aortic insufficiency   . History of aortic stenosis   . History of endocarditis 2017   prosthetic aortic valve endocarditis,   . History of shingles 2019  . Hypertension   . Insomnia   . Lumbar discitis   . Mixed  hyperlipidemia   . MSSA bacteremia 2018   Sepsis  . Nasal septal deviation   . Obesity   . Pre-diabetes   . Seborrheic keratosis   . Shortness of breath dyspnea    Chronic  . Sleep apnea    cpap nightly   . Umbilical hernia   . Varicose veins    Past Surgical History:  Procedure Laterality Date  . AORTIC VALVE REPLACEMENT  1983   St. Jude  . BUBBLE STUDY  01/22/2019   Procedure: BUBBLE STUDY;  Surgeon: Elouise Munroe, MD;  Location: [place];  Service: Cardiology;;  . Kathryne Hitch GUIDANCE N/A 08/24/2016   Procedure: Fluoroscopy Guidance;  Surgeon: Leonie Man, MD;  Location: [place] CV LAB;  Service: Cardiovascular;  Laterality: N/A;  . INGUINAL HERNIA REPAIR Bilateral 02/05/2019   Procedure: LAPAROSCOPIC BILATERAL INGUINAL HERNIA REPAIRS WITH MESH, RIGHT FEMORAL HERNIA REPAIR, TAPP APPROACH, TAP BLOCK BILATERALLY;  Surgeon: Michael Boston, MD;  Location: [facility];  Service: General;  Laterality: Bilateral;  . INSERTION OF MESH N/A 06/13/2018   Procedure: INSERTION OF MESH;  Surgeon: Michael Boston, MD;  Location: [facility];  Service: General;  Laterality: N/A;  . Nasal cautery    . RIGHT HEART CATH AND CORONARY ANGIOGRAPHY N/A 01/22/2019   Procedure: RIGHT HEART CATH AND CORONARY ANGIOGRAPHY;  Surgeon: [name], MD;  Location: [place] [facility];  Service: Cardiovascular;  Laterality: N/A;  . TEE WITHOUT CARDIOVERSION N/A 08/24/2016   Procedure: TRANSESOPHAGEAL ECHOCARDIOGRAM (TEE);  Surgeon: Pixie Casino, MD;  Location: [facility];  Service: Cardiovascular;  Laterality: N/A;  . TEE WITHOUT CARDIOVERSION N/A 01/22/2019   Procedure: TRANSESOPHAGEAL ECHOCARDIOGRAM (TEE);  Surgeon: Elouise Munroe, MD;  Location: [place];  Service: Cardiology;  Laterality: N/A;  . TONSILLECTOMY    . VEIN LIGATION AND STRIPPING Left 08/15/2014   Procedure: SAPHENOUS VEIN LIGATION AND EXCISION OF VARICOSITIES;  Surgeon: Mal Misty, MD;  Location: [place];  Service: Vascular;   Laterality: Left;  Marland Kitchen VENTRAL HERNIA REPAIR N/A 06/13/2018   Procedure: LAPAROSCOPIC VENTRAL WALL HERNIA REPAIR ERAS PATHWAY;  Surgeon: Michael Boston, MD;  Location: [facility];  Service: General;  Laterality: N/A;     Current Meds  Medication Sig  . albuterol (VENTOLIN HFA) 108 (90 Base) MCG/ACT inhaler Inhale 2 puffs into the lungs every 6 (six) hours as needed for wheezing or shortness of breath.  . alfuzosin (UROXATRAL) 10 MG 24 hr tablet Take 10 mg by mouth daily.  Marland Kitchen amLODipine (NORVASC) 2.5 MG tablet Take 1 tablet (2.5 mg total) by mouth 2 (two) times daily.  Marland Kitchen amoxicillin (AMOXIL) 500 MG capsule Take 2,000 mg by mouth See admin instructions. Take 2000 mg 1 hour prior to dental procedures  . apixaban (ELIQUIS) 5 MG TABS tablet Take 1 tablet (5 mg total) by mouth 2 (two) times daily.  Marland Kitchen aspirin 81 MG chewable tablet Chew 81 mg by mouth daily.  . cetirizine (ZYRTEC) 10 MG tablet Take 10 mg by mouth daily.  Marland Kitchen DEXILANT  60 MG capsule Take 1 capsule by mouth daily.  . diphenhydramine-acetaminophen (TYLENOL PM) 25-500 MG TABS tablet Take 1 tablet by mouth at bedtime.  . famotidine (PEPCID) 20 MG tablet Take by mouth.  . ferrous sulfate 324 (65 Fe) MG TBEC Take 324 mg by mouth daily.   . folic acid (FOLVITE) 1 MG tablet Take 1 mg by mouth daily.  Marland Kitchen linaclotide (LINZESS) 72 MCG capsule   . methotrexate 50 MG/2ML injection Inject 15 mg into the skin once a week.  . metoprolol tartrate (LOPRESSOR) 25 MG tablet Take 6.25 mg by mouth 2 (two) times daily.  . Multiple Vitamins-Minerals (CENTRUM SILVER ADULT 50+ PO) Take 1 tablet by mouth daily with breakfast.  . Psyllium (METAMUCIL PO) Take 1 Dose by mouth daily.  . rosuvastatin (CRESTOR) 5 MG tablet Take 5 mg by mouth at bedtime.   . Simethicone 250 MG CAPS Take 250 mg by mouth daily.  . valsartan (DIOVAN) 160 MG tablet Take 1 tablet (160 mg total) by mouth 2 (two) times daily.  Marland Kitchen zolpidem (AMBIEN) 10 MG tablet Take 10 mg by mouth at bedtime.       Allergies:   Patient has no known allergies.   Social History   Tobacco Use  . Smoking status: Never Smoker  . Smokeless tobacco: Never Used  Substance Use Topics  . Alcohol use: Yes    Alcohol/week: 0.0 standard drinks    Comment: wine at night. 12 ounce can of beer ech day   . Drug use: No     Family Hx: The patient's family history includes Heart disease in his father.  ROS:   Please see the history of present illness.    Right lower rib pain All other systems reviewed and are negative.   Prior CV studies:   The following studies were reviewed today:    Labs/Other Tests and Data Reviewed:    EKG:  An ECG dated 04/2019 was personally reviewed today and demonstrated:  sinus brady, LVH, associated T wave changes  Recent Labs: 04/26/2019: BUN 24; Creatinine, Ser 0.89; Hemoglobin 13.1; Platelets 331; Potassium 4.4; Sodium 141   Recent Lipid Panel Lab Results  Component Value Date/Time   CHOL 116 04/26/2019 07:53 AM   TRIG 84 04/26/2019 07:53 AM   HDL 48 04/26/2019 07:53 AM   CHOLHDL 2.4 04/26/2019 07:53 AM   CHOLHDL 3.7 03/08/2016 08:37 AM   LDLCALC 51 04/26/2019 07:53 AM    Wt Readings from Last 3 Encounters:  09/03/19 191 lb 9.6 oz (86.9 kg)  06/04/19 189 lb (85.7 kg)  04/29/19 181 lb 3.2 oz (82.2 kg)     Objective:    Vital Signs:  BP 136/87   Ht 6' (1.829 m)   Wt 191 lb 9.6 oz (86.9 kg)   BMI 25.99 kg/m    VITAL SIGNS:  reviewed GEN:  no acute distress RESPIRATORY:  normal respiratory effort, symmetric expansion MUSCULOSKELETAL:  no obvious deformities. NEURO:  alert and oriented x 3, no obvious focal deficit PSYCH:  normal affect exam limited by video format  ASSESSMENT & PLAN:    1. S/p AVR: Continue SBE prophylaxis.   No signs of CHF.  He will check with [facility] clinic to see whether any imaging is required.  He was scheduled to go there for follow-up but due to Covid, he will not go there in person. 2. Hypertension: The current  medical regimen is effective;  continue present plan and medications. 3. Atrial fibrillation/flutter: We have continued  carvedilol.  Amiodarone was stopped in October 2020.  He has continued Eliquis.   4. Hyperlipidemia: LDL 51 in October 2020.  Does eat some prepackaged food.  Trying to eat healthy.   5. Leg edema: Chronic lower extremity venous insufficiency  COVID-19 Education: The signs and symptoms of COVID-19 were discussed with the patient and how to seek care for testing (follow up with PCP or arrange E-visit).  The importance of social distancing was discussed today.  Time:   Today, I have spent 12  minutes with the patient with telehealth technology discussing the above problems.     Medication Adjustments/Labs and Tests Ordered: Current medicines are reviewed at length with the patient today.  Concerns regarding medicines are outlined above.   Tests Ordered: No orders of the defined types were placed in this encounter.   Medication Changes: No orders of the defined types were placed in this encounter.   Follow Up:  Either In Person or Virtual in 6 month(s)  Signed, Larae Grooms, MD  09/03/2019 10:35 AM    [place]

## 2019-09-03 ENCOUNTER — Encounter: Payer: Self-pay | Admitting: Interventional Cardiology

## 2019-09-03 ENCOUNTER — Telehealth (INDEPENDENT_AMBULATORY_CARE_PROVIDER_SITE_OTHER): Payer: Medicare Other | Admitting: Interventional Cardiology

## 2019-09-03 VITALS — BP 136/87 | Ht 72.0 in | Wt 191.6 lb

## 2019-09-03 DIAGNOSIS — Z7901 Long term (current) use of anticoagulants: Secondary | ICD-10-CM

## 2019-09-03 DIAGNOSIS — E782 Mixed hyperlipidemia: Secondary | ICD-10-CM | POA: Diagnosis not present

## 2019-09-03 DIAGNOSIS — Z952 Presence of prosthetic heart valve: Secondary | ICD-10-CM

## 2019-09-03 DIAGNOSIS — R600 Localized edema: Secondary | ICD-10-CM

## 2019-09-03 DIAGNOSIS — I10 Essential (primary) hypertension: Secondary | ICD-10-CM | POA: Diagnosis not present

## 2019-09-03 NOTE — Patient Instructions (Signed)
Medication Instructions:  Your physician recommends that you continue on your current medications as directed. Please refer to the Current Medication list given to you today.  *If you need a refill on your cardiac medications before your next appointment, please call your pharmacy*   Lab Work: None ordered  If you have labs (blood work) drawn today and your tests are completely normal, you will receive your results only by: . MyChart Message (if you have MyChart) OR . A paper copy in the mail If you have any lab test that is abnormal or we need to change your treatment, we will call you to review the results.   Testing/Procedures: None ordered   Follow-Up: At [facility], you and your health needs are our priority.  As part of our continuing mission to provide you with exceptional heart care, we have created designated Provider Care Teams.  These Care Teams include your primary Cardiologist (physician) and Advanced Practice Providers (APPs -  Physician Assistants and Nurse Practitioners) who all work together to provide you with the care you need, when you need it.  We recommend signing up for the patient portal called "MyChart".  Sign up information is provided on this After Visit Summary.  MyChart is used to connect with patients for Virtual Visits (Telemedicine).  Patients are able to view lab/test results, encounter notes, upcoming appointments, etc.  Non-urgent messages can be sent to your provider as well.   To learn more about what you can do with MyChart, go to [email or web address].    Your next appointment:   6 month(s)  The format for your next appointment:   In Person  Provider:   You may see Jayadeep Varanasi, MD or one of the following Advanced Practice Providers on your designated Care Team:    Dayna Dunn, PA-C  Michele Lenze, PA-C    Other Instructions   

## 2019-09-07 ENCOUNTER — Other Ambulatory Visit: Payer: Self-pay | Admitting: Interventional Cardiology

## 2019-09-09 ENCOUNTER — Other Ambulatory Visit: Payer: Self-pay

## 2019-09-24 DIAGNOSIS — I4891 Unspecified atrial fibrillation: Secondary | ICD-10-CM

## 2019-09-27 ENCOUNTER — Other Ambulatory Visit: Payer: Medicare Other | Admitting: *Deleted

## 2019-09-27 ENCOUNTER — Other Ambulatory Visit: Payer: Self-pay

## 2019-09-27 DIAGNOSIS — I4891 Unspecified atrial fibrillation: Secondary | ICD-10-CM

## 2019-09-27 LAB — BASIC METABOLIC PANEL
BUN/Creatinine Ratio: 22 (ref 10–24)
BUN: 19 mg/dL (ref 8–27)
CO2: 23 mmol/L (ref 20–29)
Calcium: 9.4 mg/dL (ref 8.6–10.2)
Chloride: 102 mmol/L (ref 96–106)
Creatinine, Ser: 0.85 mg/dL (ref 0.76–1.27)
GFR calc Af Amer: 106 mL/min/{1.73_m2} (ref >59)
GFR calc non Af Amer: 92 mL/min/{1.73_m2} (ref >59)
Glucose: 85 mg/dL (ref 65–99)
Potassium: 4.4 mmol/L (ref 3.5–5.2)
Sodium: 139 mmol/L (ref 134–144)

## 2019-09-27 LAB — CBC
Hematocrit: 37.7 % (ref 37.5–51.0)
Hemoglobin: 12.7 g/dL — ABNORMAL LOW (ref 13.0–17.7)
MCH: 30.5 pg (ref 26.6–33.0)
MCHC: 33.7 g/dL (ref 31.5–35.7)
MCV: 91 fL (ref 79–97)
Platelets: 213 10*3/uL (ref 150–450)
RBC: 4.16 x10E6/uL (ref 4.14–5.80)
RDW: 13.5 % (ref 11.6–15.4)
WBC: 6.5 10*3/uL (ref 3.4–10.8)

## 2019-10-09 ENCOUNTER — Ambulatory Visit (INDEPENDENT_AMBULATORY_CARE_PROVIDER_SITE_OTHER): Payer: Medicare Other | Admitting: Emergency Medicine

## 2019-10-09 ENCOUNTER — Encounter: Payer: Self-pay | Admitting: Emergency Medicine

## 2019-10-09 ENCOUNTER — Other Ambulatory Visit: Payer: Self-pay

## 2019-10-09 DIAGNOSIS — G4733 Obstructive sleep apnea (adult) (pediatric): Secondary | ICD-10-CM | POA: Diagnosis not present

## 2019-10-09 DIAGNOSIS — J4520 Mild intermittent asthma, uncomplicated: Secondary | ICD-10-CM

## 2019-10-09 NOTE — Assessment & Plan Note (Signed)
Keep your albuterol available to use 2 puffs if needed for shortness of breath, chest tightness, wheezing. We will not start any scheduled maintenance inhaler medication at this time.

## 2019-10-09 NOTE — Progress Notes (Signed)
Subjective:    Patient ID: Jeffery Whitehead, male    DOB: Sep 19, 1955, 64 y.o.   MRN: [id number]  Asthma He complains of shortness of breath. There is no cough or wheezing. Pertinent negatives include no ear pain, fever, headaches, postnasal drip, rhinorrhea, sneezing, sore throat or trouble swallowing. His past medical history is significant for asthma.  Shortness of Breath Pertinent negatives include no ear pain, fever, headaches, leg swelling, rash, rhinorrhea, sore throat, vomiting or wheezing. His past medical history is significant for asthma.    ROV 01/01/2019 --64 year old gentleman with a history of OSA and asthma.  He is also had a history of chronic cough that has been in part influenced by GERD, and ACE inhibitor (which we stopped).  We have been managing him on Symbicort.  He has good compliance with his CPAP.  A download today shows that he used it 93% of days, 67% greater than 4 hours.  He is on an AutoSet mode 5-15 cmH2O. Uses a nasal mask, machine is in good repair. Does not have any dyspnea. No wheeze or cough. Minimal albuterol use.   He is followed by Dr Irish Lack, has a hx AVR, endocarditis, AV calcifications.  He is being seen by Rheum for suspected sero-negative RA, is currently on prednisone. Helping his joints, no real change in his breathing. He is planning to start MTX.  Dr Collene Mares recently started him on dexilant + omeprazole.   ROV 10/09/19 --64 yo man who follows up today for obstructive sleep apne and asthma.  He also has a history of endocarditis and aortic valve replacement >> repeat surgery in 03/2019. He is now on Eliquis. He has been having some nose bleeding since he made that change.  His breathing is doing well, never needs albuterol. His CPAP compliance is good - 93% compliance, 77% for > 4 hours. Minimal leak, good control of apneas.   He has been seen by rheumatology for suspected seronegative RA, treated with MTX. He is off prednisone.    Review of Systems    Constitutional: Negative for fever and unexpected weight change.  HENT: Negative for congestion, dental problem, ear pain, nosebleeds, postnasal drip, rhinorrhea, sinus pressure, sneezing, sore throat and trouble swallowing.   Eyes: Negative for redness and itching.  Respiratory: Positive for shortness of breath. Negative for cough, chest tightness and wheezing.   Cardiovascular: Negative for palpitations and leg swelling.  Gastrointestinal: Negative for nausea and vomiting.  Genitourinary: Negative for dysuria.  Musculoskeletal: Negative for joint swelling.  Skin: Negative for rash.  Neurological: Negative for headaches.  Hematological: Does not bruise/bleed easily.  Psychiatric/Behavioral: Negative for dysphoric mood. The patient is not nervous/anxious.        Objective:   Physical Exam  Vitals:   10/09/19 1503  BP: (!) 108/58  Pulse: 68  Temp: 97.6 F (36.4 C)  TempSrc: Temporal  SpO2: 95%  Weight: 200 lb (90.7 kg)  Height: 6' (1.829 m)   obese, in no distress,  normal affect  ENT: No lesions,  mouth clear,  oropharynx clear, no postnasal drip  Neck: No JVD, no stridor  Lungs: No use of accessory muscles, clear without rales or rhonchi  Cardiovascular: RRR, systolic M with an S2 click, trace pretibial edema  Musculoskeletal: No deformities, no cyanosis or clubbing  Neuro: alert, non focal  Skin: Warm, no lesions or rashes     Assessment & Plan:  Obstructive sleep apnea syndrome You have great compliance with your CPAP.  Keep up the  good work. Let us know if you have any trouble with your machine, equipment and we will try to troubleshoot with you. Follow with Dr. Lamonte Sakai in 12 months or sooner if you have any problems.  Asthma Keep your albuterol available to use 2 puffs if needed for shortness of breath, chest tightness, wheezing. We will not start any scheduled maintenance inhaler medication at this time.  Baltazar Apo, MD, PhD 10/09/2019, 3:31 PM [place]  [facility] [phone] or if no answer [phone]

## 2019-10-09 NOTE — Patient Instructions (Signed)
You have great compliance with your CPAP.  Keep up the good work. Let us know if you have any trouble with your machine, equipment and we will try to troubleshoot with you. Keep your albuterol available to use 2 puffs if needed for shortness of breath, chest tightness, wheezing. We will not start any scheduled maintenance inhaler medication at this time. Follow with Dr. Lamonte Sakai in 12 months or sooner if you have any problems.

## 2019-10-09 NOTE — Assessment & Plan Note (Signed)
You have great compliance with your CPAP.  Keep up the good work. Let us know if you have any trouble with your machine, equipment and we will try to troubleshoot with you. Follow with Dr. Lamonte Sakai in 12 months or sooner if you have any problems.

## 2019-10-25 ENCOUNTER — Other Ambulatory Visit: Payer: Self-pay | Admitting: Interventional Cardiology

## 2019-11-04 DIAGNOSIS — Z7901 Long term (current) use of anticoagulants: Secondary | ICD-10-CM | POA: Diagnosis not present

## 2019-11-04 DIAGNOSIS — R040 Epistaxis: Secondary | ICD-10-CM | POA: Diagnosis not present

## 2019-11-23 DIAGNOSIS — R1011 Right upper quadrant pain: Secondary | ICD-10-CM | POA: Diagnosis not present

## 2019-11-23 DIAGNOSIS — R1012 Left upper quadrant pain: Secondary | ICD-10-CM | POA: Diagnosis not present

## 2019-11-23 DIAGNOSIS — Z8719 Personal history of other diseases of the digestive system: Secondary | ICD-10-CM | POA: Diagnosis not present

## 2019-11-25 DIAGNOSIS — N401 Enlarged prostate with lower urinary tract symptoms: Secondary | ICD-10-CM | POA: Diagnosis not present

## 2019-11-25 DIAGNOSIS — I714 Abdominal aortic aneurysm, without rupture: Secondary | ICD-10-CM | POA: Diagnosis not present

## 2019-11-25 DIAGNOSIS — Z8719 Personal history of other diseases of the digestive system: Secondary | ICD-10-CM | POA: Diagnosis not present

## 2019-11-25 DIAGNOSIS — N138 Other obstructive and reflux uropathy: Secondary | ICD-10-CM | POA: Diagnosis not present

## 2019-11-26 DIAGNOSIS — M2550 Pain in unspecified joint: Secondary | ICD-10-CM | POA: Diagnosis not present

## 2019-11-26 DIAGNOSIS — E663 Overweight: Secondary | ICD-10-CM | POA: Diagnosis not present

## 2019-11-26 DIAGNOSIS — R5382 Chronic fatigue, unspecified: Secondary | ICD-10-CM | POA: Diagnosis not present

## 2019-11-26 DIAGNOSIS — Z6826 Body mass index (BMI) 26.0-26.9, adult: Secondary | ICD-10-CM | POA: Diagnosis not present

## 2019-11-26 DIAGNOSIS — M0600 Rheumatoid arthritis without rheumatoid factor, unspecified site: Secondary | ICD-10-CM | POA: Diagnosis not present

## 2019-12-09 DIAGNOSIS — M5136 Other intervertebral disc degeneration, lumbar region: Secondary | ICD-10-CM | POA: Diagnosis not present

## 2019-12-09 DIAGNOSIS — M545 Low back pain: Secondary | ICD-10-CM | POA: Diagnosis not present

## 2019-12-09 DIAGNOSIS — M47816 Spondylosis without myelopathy or radiculopathy, lumbar region: Secondary | ICD-10-CM | POA: Diagnosis not present

## 2019-12-17 DIAGNOSIS — M5136 Other intervertebral disc degeneration, lumbar region: Secondary | ICD-10-CM | POA: Diagnosis not present

## 2019-12-17 DIAGNOSIS — Z9889 Other specified postprocedural states: Secondary | ICD-10-CM | POA: Diagnosis not present

## 2019-12-17 DIAGNOSIS — Z8719 Personal history of other diseases of the digestive system: Secondary | ICD-10-CM | POA: Diagnosis not present

## 2019-12-17 DIAGNOSIS — R1032 Left lower quadrant pain: Secondary | ICD-10-CM | POA: Diagnosis not present

## 2019-12-17 DIAGNOSIS — R1031 Right lower quadrant pain: Secondary | ICD-10-CM | POA: Diagnosis not present

## 2020-01-02 DIAGNOSIS — E782 Mixed hyperlipidemia: Secondary | ICD-10-CM | POA: Diagnosis not present

## 2020-01-02 DIAGNOSIS — K219 Gastro-esophageal reflux disease without esophagitis: Secondary | ICD-10-CM | POA: Diagnosis not present

## 2020-01-02 DIAGNOSIS — F5101 Primary insomnia: Secondary | ICD-10-CM | POA: Diagnosis not present

## 2020-01-02 DIAGNOSIS — R399 Unspecified symptoms and signs involving the genitourinary system: Secondary | ICD-10-CM | POA: Diagnosis not present

## 2020-01-10 DIAGNOSIS — R9431 Abnormal electrocardiogram [ECG] [EKG]: Secondary | ICD-10-CM | POA: Diagnosis not present

## 2020-01-10 DIAGNOSIS — I480 Paroxysmal atrial fibrillation: Secondary | ICD-10-CM | POA: Diagnosis not present

## 2020-01-10 DIAGNOSIS — I729 Aneurysm of unspecified site: Secondary | ICD-10-CM | POA: Diagnosis not present

## 2020-01-10 DIAGNOSIS — T826XXD Infection and inflammatory reaction due to cardiac valve prosthesis, subsequent encounter: Secondary | ICD-10-CM | POA: Diagnosis not present

## 2020-01-10 DIAGNOSIS — Z7901 Long term (current) use of anticoagulants: Secondary | ICD-10-CM | POA: Diagnosis not present

## 2020-01-10 DIAGNOSIS — I350 Nonrheumatic aortic (valve) stenosis: Secondary | ICD-10-CM | POA: Diagnosis not present

## 2020-01-10 DIAGNOSIS — R001 Bradycardia, unspecified: Secondary | ICD-10-CM | POA: Diagnosis not present

## 2020-01-10 DIAGNOSIS — T82857A Stenosis of cardiac prosthetic devices, implants and grafts, initial encounter: Secondary | ICD-10-CM | POA: Diagnosis not present

## 2020-01-10 DIAGNOSIS — I351 Nonrheumatic aortic (valve) insufficiency: Secondary | ICD-10-CM | POA: Diagnosis not present

## 2020-01-10 DIAGNOSIS — I38 Endocarditis, valve unspecified: Secondary | ICD-10-CM | POA: Diagnosis not present

## 2020-01-10 DIAGNOSIS — I10 Essential (primary) hypertension: Secondary | ICD-10-CM | POA: Diagnosis not present

## 2020-01-10 DIAGNOSIS — R0602 Shortness of breath: Secondary | ICD-10-CM | POA: Diagnosis not present

## 2020-01-10 DIAGNOSIS — Z01818 Encounter for other preprocedural examination: Secondary | ICD-10-CM | POA: Diagnosis not present

## 2020-01-10 DIAGNOSIS — Z952 Presence of prosthetic heart valve: Secondary | ICD-10-CM | POA: Diagnosis not present

## 2020-01-10 DIAGNOSIS — Z5181 Encounter for therapeutic drug level monitoring: Secondary | ICD-10-CM | POA: Diagnosis not present

## 2020-01-10 DIAGNOSIS — E785 Hyperlipidemia, unspecified: Secondary | ICD-10-CM | POA: Diagnosis not present

## 2020-01-10 DIAGNOSIS — E782 Mixed hyperlipidemia: Secondary | ICD-10-CM | POA: Diagnosis not present

## 2020-02-03 DIAGNOSIS — M2550 Pain in unspecified joint: Secondary | ICD-10-CM | POA: Diagnosis not present

## 2020-03-17 DIAGNOSIS — I77810 Thoracic aortic ectasia: Secondary | ICD-10-CM | POA: Diagnosis not present

## 2020-03-17 DIAGNOSIS — Z23 Encounter for immunization: Secondary | ICD-10-CM | POA: Diagnosis not present

## 2020-03-17 DIAGNOSIS — Z952 Presence of prosthetic heart valve: Secondary | ICD-10-CM | POA: Diagnosis not present

## 2020-03-17 DIAGNOSIS — M069 Rheumatoid arthritis, unspecified: Secondary | ICD-10-CM | POA: Diagnosis not present

## 2020-03-17 DIAGNOSIS — K589 Irritable bowel syndrome without diarrhea: Secondary | ICD-10-CM | POA: Diagnosis not present

## 2020-03-17 DIAGNOSIS — G4700 Insomnia, unspecified: Secondary | ICD-10-CM | POA: Diagnosis not present

## 2020-04-14 DIAGNOSIS — M0609 Rheumatoid arthritis without rheumatoid factor, multiple sites: Secondary | ICD-10-CM | POA: Diagnosis not present

## 2020-04-14 DIAGNOSIS — R58 Hemorrhage, not elsewhere classified: Secondary | ICD-10-CM | POA: Diagnosis not present

## 2020-04-14 DIAGNOSIS — Z79899 Other long term (current) drug therapy: Secondary | ICD-10-CM | POA: Diagnosis not present

## 2020-04-28 DIAGNOSIS — K219 Gastro-esophageal reflux disease without esophagitis: Secondary | ICD-10-CM | POA: Diagnosis not present

## 2020-04-28 DIAGNOSIS — E611 Iron deficiency: Secondary | ICD-10-CM | POA: Diagnosis not present

## 2020-04-28 DIAGNOSIS — Z1159 Encounter for screening for other viral diseases: Secondary | ICD-10-CM | POA: Diagnosis not present

## 2020-04-28 DIAGNOSIS — I10 Essential (primary) hypertension: Secondary | ICD-10-CM | POA: Diagnosis not present

## 2020-04-28 DIAGNOSIS — Z7901 Long term (current) use of anticoagulants: Secondary | ICD-10-CM | POA: Diagnosis not present

## 2020-04-28 DIAGNOSIS — I4891 Unspecified atrial fibrillation: Secondary | ICD-10-CM | POA: Diagnosis not present

## 2020-04-28 DIAGNOSIS — G4700 Insomnia, unspecified: Secondary | ICD-10-CM | POA: Diagnosis not present

## 2020-04-28 DIAGNOSIS — E7800 Pure hypercholesterolemia, unspecified: Secondary | ICD-10-CM | POA: Diagnosis not present

## 2020-04-28 DIAGNOSIS — R040 Epistaxis: Secondary | ICD-10-CM | POA: Diagnosis not present

## 2020-04-28 DIAGNOSIS — Z125 Encounter for screening for malignant neoplasm of prostate: Secondary | ICD-10-CM | POA: Diagnosis not present

## 2020-04-28 DIAGNOSIS — I9789 Other postprocedural complications and disorders of the circulatory system, not elsewhere classified: Secondary | ICD-10-CM | POA: Diagnosis not present

## 2020-05-05 DIAGNOSIS — I77810 Thoracic aortic ectasia: Secondary | ICD-10-CM | POA: Diagnosis not present

## 2020-05-05 DIAGNOSIS — I10 Essential (primary) hypertension: Secondary | ICD-10-CM | POA: Diagnosis not present

## 2020-05-05 DIAGNOSIS — I9789 Other postprocedural complications and disorders of the circulatory system, not elsewhere classified: Secondary | ICD-10-CM | POA: Diagnosis not present

## 2020-05-05 DIAGNOSIS — E7800 Pure hypercholesterolemia, unspecified: Secondary | ICD-10-CM | POA: Diagnosis not present

## 2020-05-05 DIAGNOSIS — Z954 Presence of other heart-valve replacement: Secondary | ICD-10-CM | POA: Diagnosis not present

## 2020-05-05 DIAGNOSIS — I4891 Unspecified atrial fibrillation: Secondary | ICD-10-CM | POA: Diagnosis not present

## 2020-05-06 DIAGNOSIS — Z23 Encounter for immunization: Secondary | ICD-10-CM | POA: Diagnosis not present

## 2021-05-07 IMAGING — DX CHEST - 2 VIEW
2 series · 2 of 2 positions shown · non-contrast
Comparison: Chest x-ray dated August 25, 2016.

CLINICAL DATA: Therapeutic drug monitoring.

EXAM:
CHEST - 2 VIEW

[chest pa]
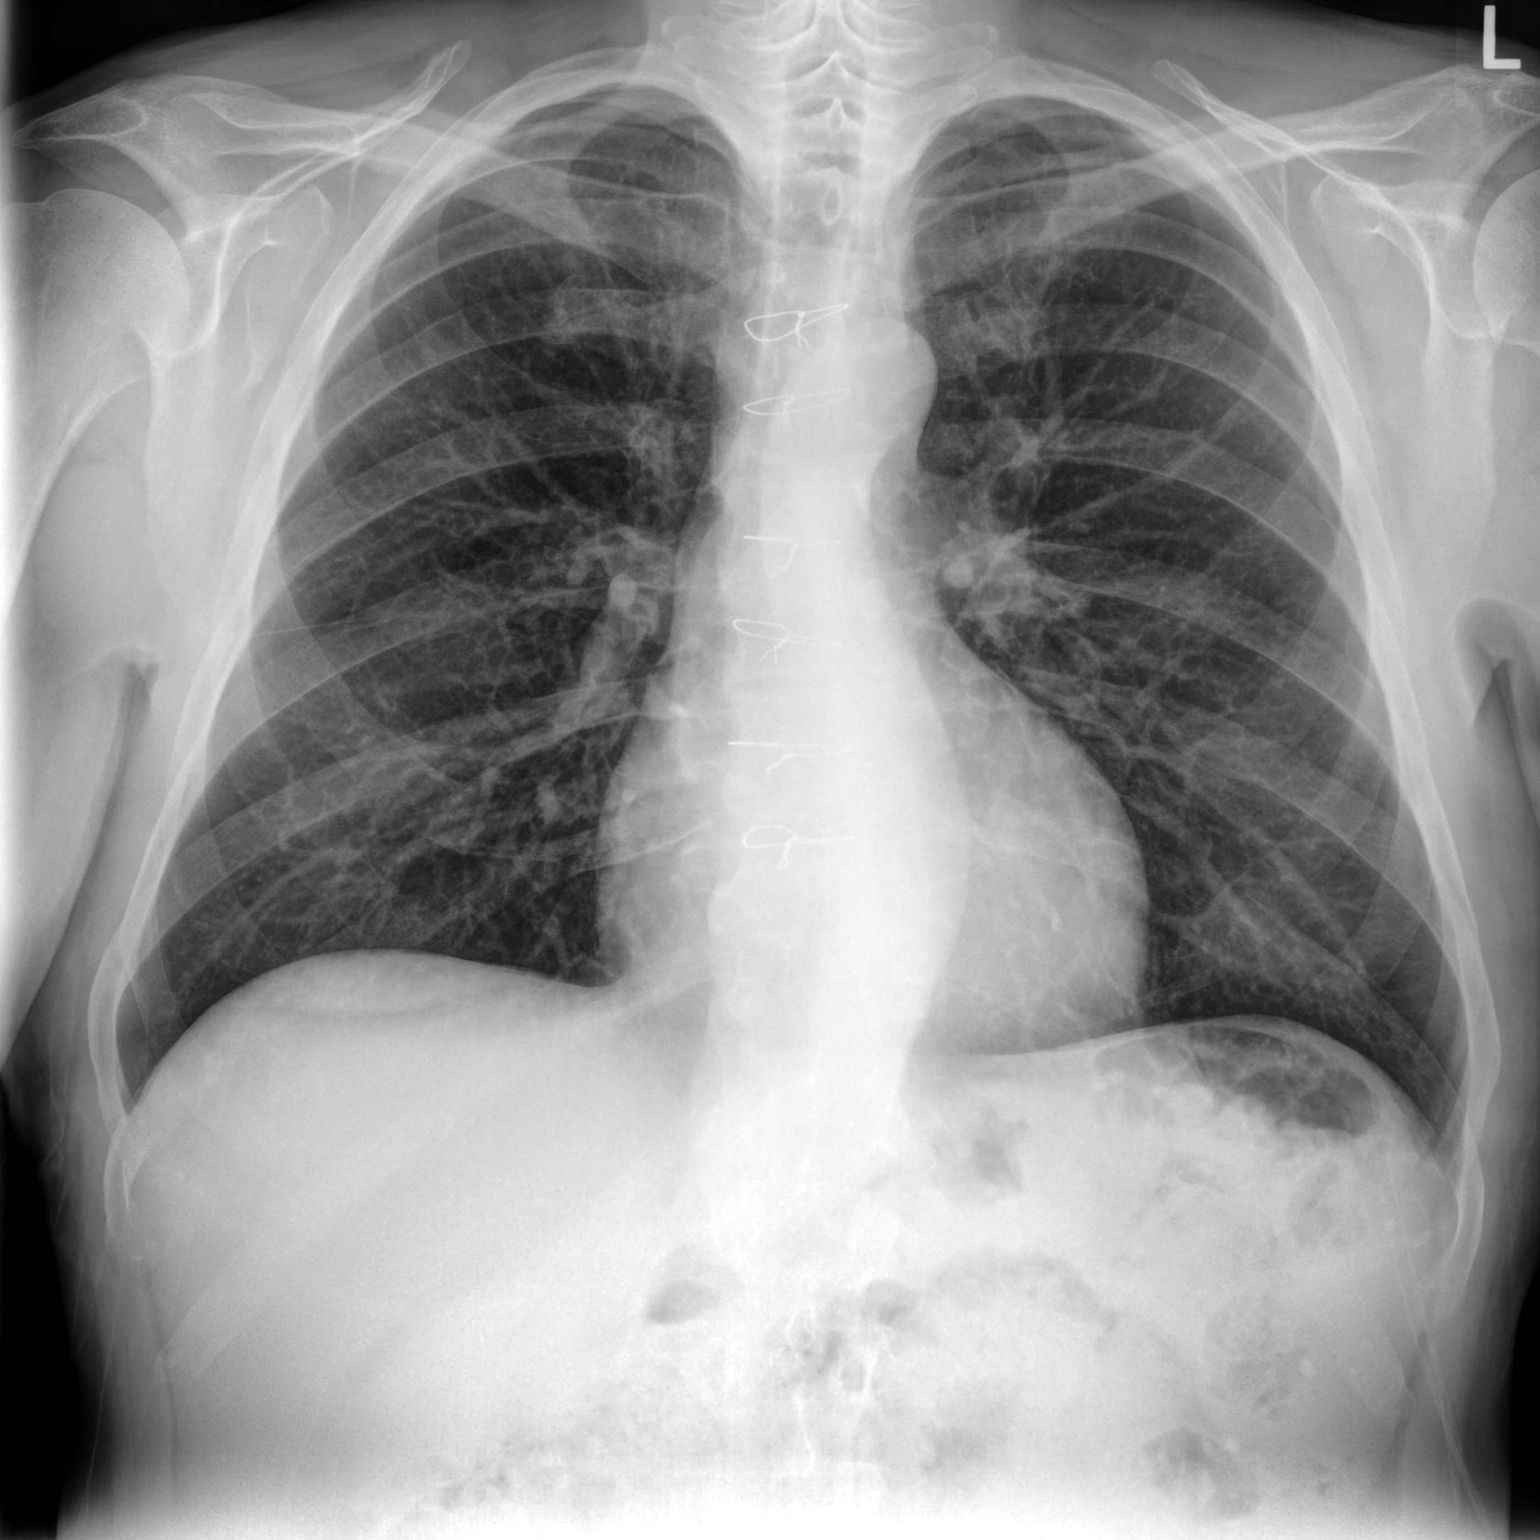

[chest lat]
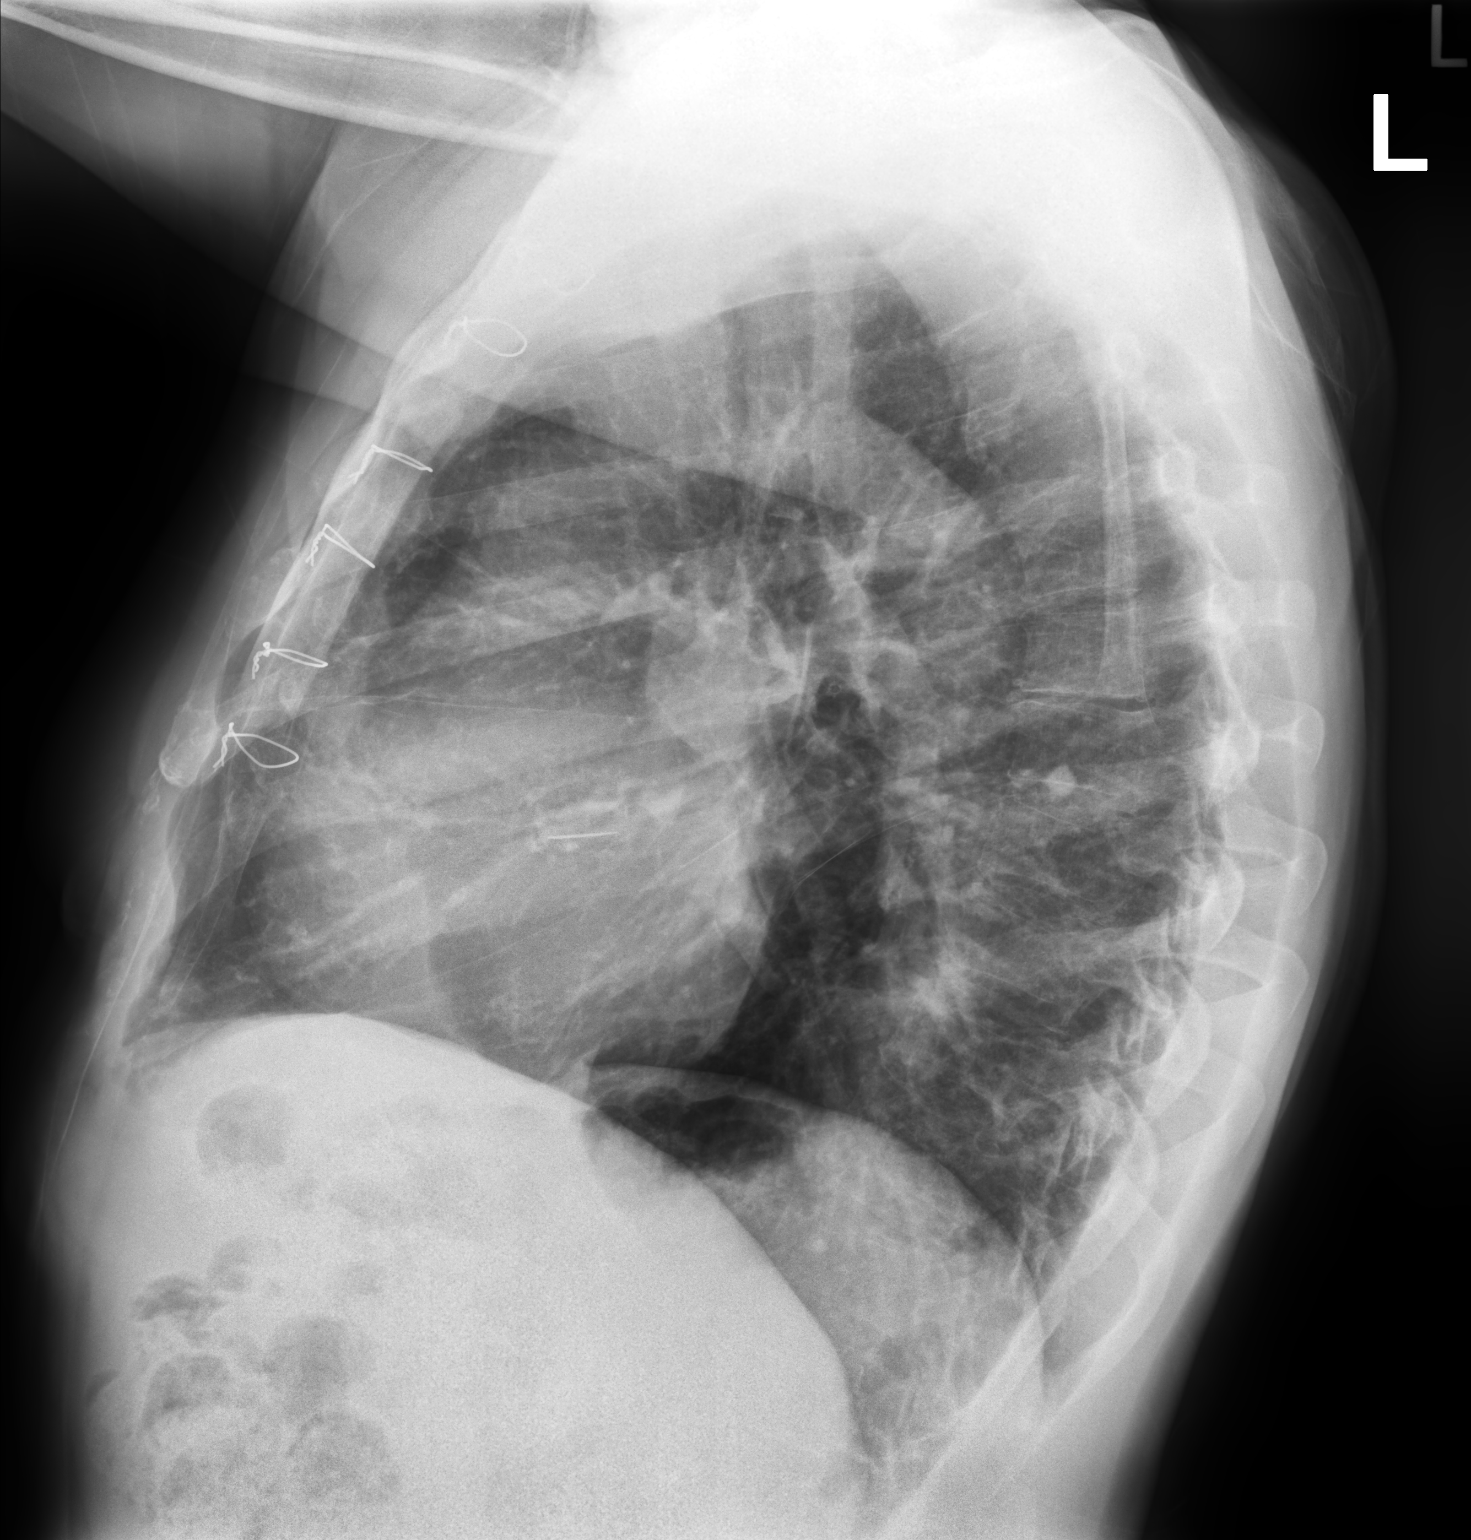

[2 of 2 positions shown; findings below may reference images not displayed]

FINDINGS: Interval removal of the right upper extremity PICC line. Prior
aortic valve replacement. The heart size and mediastinal contours
are within normal limits. Normal pulmonary vascularity. No focal
consolidation, pleural effusion, or pneumothorax. No acute osseous
abnormality.
IMPRESSION: No active cardiopulmonary disease.

## 2021-05-23 IMAGING — CT CT HEAR MORPH WITH CTA COR WITH SCORE WITH CA WITH CONTRAST AND
2 of 10 series · 8 of 20 positions shown, 9 images · IV contrast (APPLIED)
Comparison: 01/02/2019 chest radiograph.
COMPARISON: 01/02/2019 chest radiograph.

Addendum:
EXAM:
OVER-READ INTERPRETATION  CT CHEST

The following report is an over-read performed by radiologist Dr.
Maxmed Aki [REDACTED] on 01/17/2019. This over-read
does not include interpretation of cardiac or coronary anatomy or
pathology. The cardiac CTA interpretation by the cardiologist is
attached.
CLINICAL DATA: 62 -year-old male with h/o bicuspid aortic valve,
s/p AVR with a mechanical Blade Aujla Valve in 5151, h/o MSSA
endocarditis treated with antibiotics, now with severe aortic
stenosis.
Cardiac TAVR CT
TECHNIQUE: The patient was scanned on a [employer] scanner. A 120 kV
retrospective scan was triggered in the descending thoracic aorta at
111 HU's. Gantry rotation speed was 250 msecs and collimation was .6
mm. No beta blockade or nitro were given. The 3D data set was
reconstructed in 5% intervals of the R-R cycle. Systolic and
diastolic phases were analyzed on a dedicated work station using
MPR, MIP and VRT modes. The patient received 80 cc of contrast.

[Series 9: 0-90% · axial · 0.39mm/px · z∈[+1157,+1285]mm · 4 of 3570 slices shown, 5 images]
[im 714/3570  vessel]
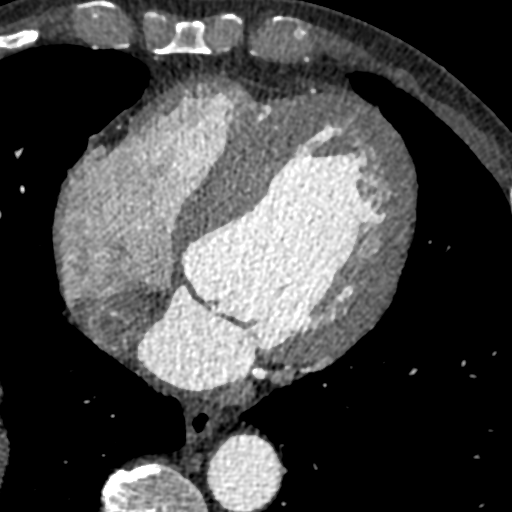
[im 714/3570  lung]
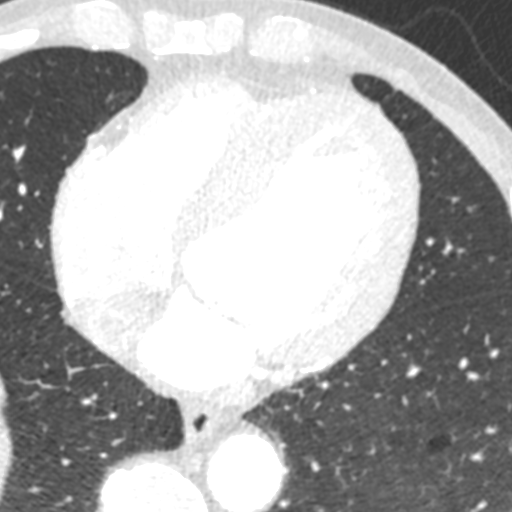
[im 1428/3570  vessel]
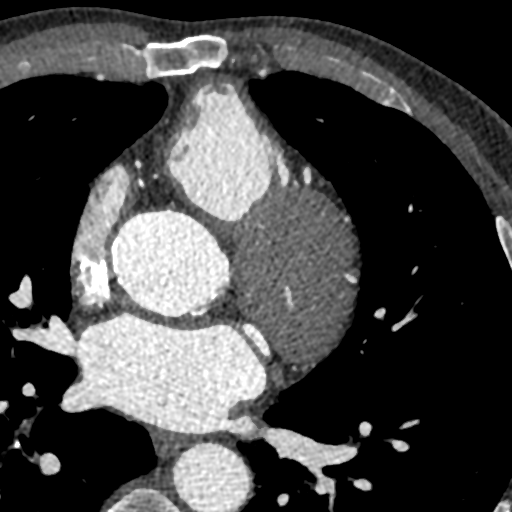
[im 2142/3570  vessel]
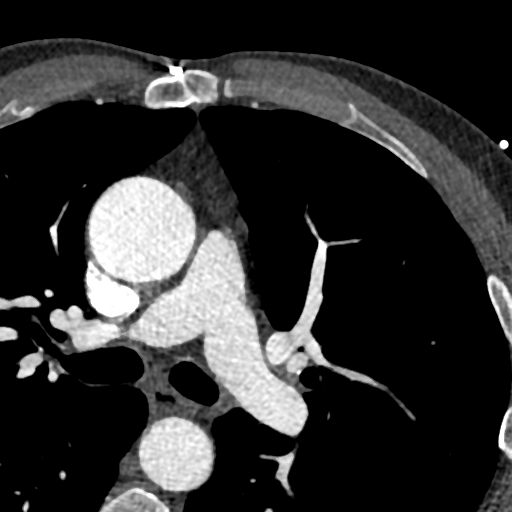
[im 2856/3570  vessel]
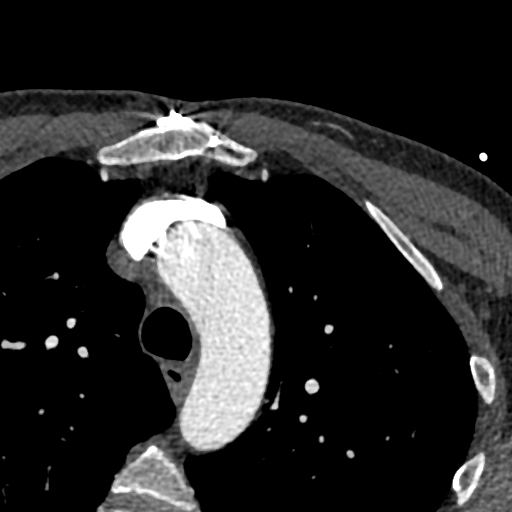

[Series 10: 5-95% · axial · 0.39mm/px · z∈[+1157,+1285]mm · 4 of 3570 slices shown]
[im 714/3570  vessel]
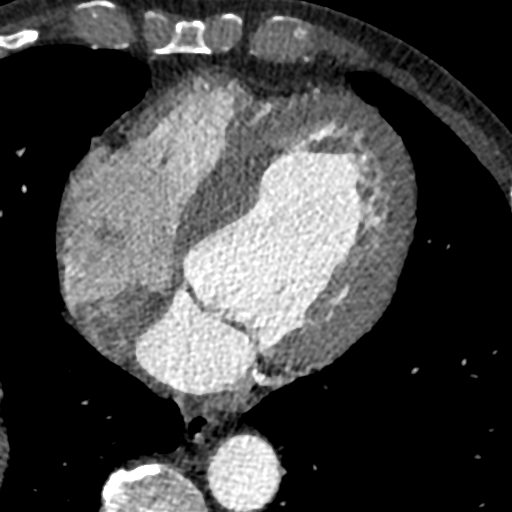
[im 1428/3570  vessel]
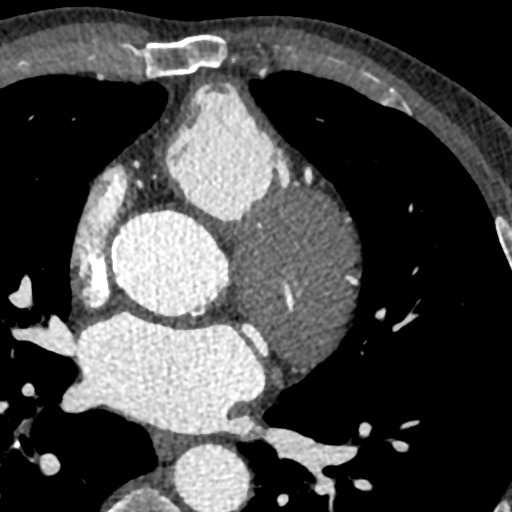
[im 2142/3570  vessel]
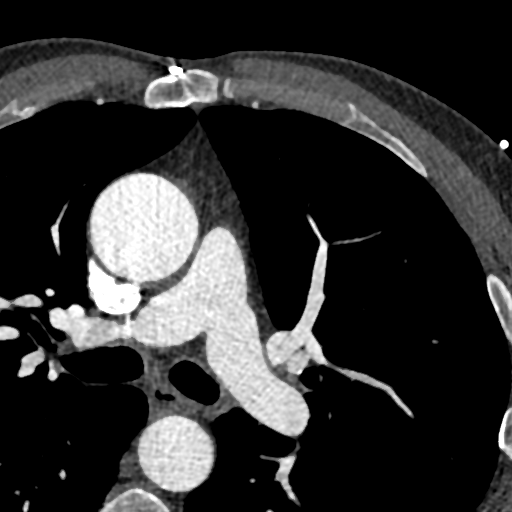
[im 2856/3570  vessel]
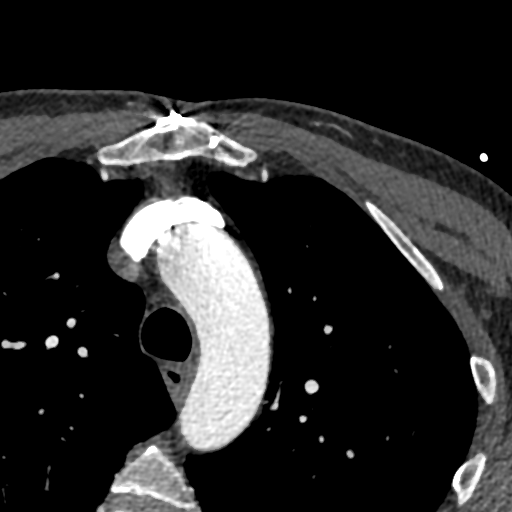

[8 of 20 positions shown; findings below may reference images not displayed]

FINDINGS: Please see the separate concurrent chest CT angiogram report for
details.
IMPRESSION: Please see the separate concurrent chest CT angiogram report for
details.
FINDINGS: Aortic Valve: A mechanical bi-leaflet valve is present in the aortic
position with good leaflet excursion and no limitation in motion.

There are three pseudoaneurysms along the infero-posterior border of
the valve ring that are dynamically enlarging in systole with the
largest pseudoaneurysm measuring 30 x 27 x 17 mm.

Aorta: Mild ascending aortic aneurysm with maximum diameter 41 mm,
mild diffuse atherosclerotic plaque and calcifications and no
dissection.

Sinotubular Junction: 36 x 36 mm

Ascending Thoracic Aorta: 41 x 40 mm

Aortic Arch: 29 x 28 mm

Descending Thoracic Aorta: 27 x 27 mm

Sinus of Valsalva Measurements:

42 x 40 mm

Mechanical Valve Ring Measurements:

Diameter: 24 x 24 mm

Perimeter: 73 mm

Area: 417 mm2

Coronary Arteries: Normal origin, the study performed without use of
NTG.
IMPRESSION: 1. A mechanical bi-leaflet valve is present in the aortic position
with good leaflet excursion and no limitation in motion. There are
three pseudoaneurysms along the infero-posterior border of the valve
ring that are dynamically enlarging in systole with the largest
pseudoaneurysm measuring 30 x 27 x 17 mm. The smaller ones measure
17 mm and 15 mm in diameter. These are most probably result of the
prior prosthesis endocarditis.

2. Aorta: Mild ascending aortic aneurysm with maximum diameter 41
mm, mild diffuse atherosclerotic plaque and calcifications and no
dissection.

3. No thrombus in the left atrial appendage.

*** End of Addendum ***
EXAM:
OVER-READ INTERPRETATION  CT CHEST

The following report is an over-read performed by radiologist Dr.
Maxmed Aki [REDACTED] on 01/17/2019. This over-read
does not include interpretation of cardiac or coronary anatomy or
pathology. The cardiac CTA interpretation by the cardiologist is
attached.
FINDINGS: Please see the separate concurrent chest CT angiogram report for
details.
IMPRESSION: Please see the separate concurrent chest CT angiogram report for
details.
